# Patient Record
Sex: Female | Born: 1995 | Race: Black or African American | Hispanic: No | Marital: Single | Smoking: Former smoker
Health system: Southern US, Community
[De-identification: ages and names within clinical notes are randomized; demographics above are authoritative.]

## PROBLEM LIST (undated history)

## (undated) ENCOUNTER — Inpatient Hospital Stay (HOSPITAL_COMMUNITY): Payer: Self-pay

## (undated) DIAGNOSIS — N6320 Unspecified lump in the left breast, unspecified quadrant: Secondary | ICD-10-CM

## (undated) DIAGNOSIS — N189 Chronic kidney disease, unspecified: Secondary | ICD-10-CM

## (undated) DIAGNOSIS — N6310 Unspecified lump in the right breast, unspecified quadrant: Secondary | ICD-10-CM

## (undated) DIAGNOSIS — F419 Anxiety disorder, unspecified: Secondary | ICD-10-CM

## (undated) DIAGNOSIS — R51 Headache: Secondary | ICD-10-CM

## (undated) HISTORY — PX: DENTAL SURGERY: SHX609

---
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
## 2015-02-22 ENCOUNTER — Emergency Department (HOSPITAL_COMMUNITY)
Admission: EM | Admit: 2015-02-22 | Discharge: 2015-02-22 | Disposition: A | Discharge status: Home / Self Care | Payer: Medicaid Other | Attending: Emergency Medicine | Admitting: Emergency Medicine

## 2015-02-22 ENCOUNTER — Encounter (HOSPITAL_COMMUNITY): Payer: Self-pay | Admitting: Emergency Medicine

## 2015-02-22 DIAGNOSIS — Z3202 Encounter for pregnancy test, result negative: Secondary | ICD-10-CM | POA: Diagnosis not present

## 2015-02-22 DIAGNOSIS — N12 Tubulo-interstitial nephritis, not specified as acute or chronic: Secondary | ICD-10-CM | POA: Insufficient documentation

## 2015-02-22 DIAGNOSIS — R112 Nausea with vomiting, unspecified: Secondary | ICD-10-CM | POA: Diagnosis present

## 2015-02-22 LAB — COMPREHENSIVE METABOLIC PANEL
ALK PHOS: 61 U/L (ref 38–126)
ALT: 15 U/L (ref 14–54)
ANION GAP: 8 (ref 5–15)
AST: 26 U/L (ref 15–41)
Albumin: 4.3 g/dL (ref 3.5–5.0)
BUN: 13 mg/dL (ref 6–20)
CO2: 23 mmol/L (ref 22–32)
Calcium: 9.1 mg/dL (ref 8.9–10.3)
Chloride: 104 mmol/L (ref 101–111)
Creatinine, Ser: 0.93 mg/dL (ref 0.44–1.00)
GFR calc Af Amer: >60 mL/min (ref >60)
GFR calc non Af Amer: >60 mL/min (ref >60)
Glucose, Bld: 112 mg/dL — ABNORMAL HIGH (ref 65–99)
POTASSIUM: 3.5 mmol/L (ref 3.5–5.1)
SODIUM: 135 mmol/L (ref 135–145)
Total Bilirubin: 1 mg/dL (ref 0.3–1.2)
Total Protein: 7.9 g/dL (ref 6.5–8.1)

## 2015-02-22 LAB — CBC WITH DIFFERENTIAL/PLATELET
BASOS ABS: 0 10*3/uL (ref 0.0–0.1)
Basophils Relative: 0 % (ref 0–1)
EOS ABS: 0 10*3/uL (ref 0.0–0.7)
Eosinophils Relative: 0 % (ref 0–5)
HEMATOCRIT: 38.6 % (ref 36.0–46.0)
Hemoglobin: 13.1 g/dL (ref 12.0–15.0)
Lymphocytes Relative: 8 % — ABNORMAL LOW (ref 12–46)
Lymphs Abs: 1 10*3/uL (ref 0.7–4.0)
MCH: 28.3 pg (ref 26.0–34.0)
MCHC: 33.9 g/dL (ref 30.0–36.0)
MCV: 83.4 fL (ref 78.0–100.0)
MONO ABS: 1 10*3/uL (ref 0.1–1.0)
Monocytes Relative: 7 % (ref 3–12)
NEUTROS PCT: 85 % — AB (ref 43–77)
Neutro Abs: 11.3 10*3/uL — ABNORMAL HIGH (ref 1.7–7.7)
Platelets: 193 10*3/uL (ref 150–400)
RBC: 4.63 MIL/uL (ref 3.87–5.11)
RDW: 13.1 % (ref 11.5–15.5)
WBC: 13.2 10*3/uL — AB (ref 4.0–10.5)

## 2015-02-22 LAB — URINE MICROSCOPIC-ADD ON

## 2015-02-22 LAB — URINALYSIS, ROUTINE W REFLEX MICROSCOPIC
Bilirubin Urine: NEGATIVE
GLUCOSE, UA: NEGATIVE mg/dL
NITRITE: NEGATIVE
Protein, ur: 30 mg/dL — AB
SPECIFIC GRAVITY, URINE: 1.03 (ref 1.005–1.030)
Urobilinogen, UA: 2 mg/dL — ABNORMAL HIGH (ref 0.0–1.0)
pH: 6.5 (ref 5.0–8.0)

## 2015-02-22 LAB — WET PREP, GENITAL
TRICH WET PREP: NONE SEEN
Yeast Wet Prep HPF POC: NONE SEEN

## 2015-02-22 LAB — LIPASE, BLOOD: LIPASE: 17 U/L — AB (ref 22–51)

## 2015-02-22 LAB — PREGNANCY, URINE: Preg Test, Ur: NEGATIVE

## 2015-02-22 MED ORDER — SODIUM CHLORIDE 0.9 % IV BOLUS (SEPSIS)
1000.0000 mL | Freq: Once | INTRAVENOUS | Status: AC
  Administered 2015-02-22: 1000 mL via INTRAVENOUS

## 2015-02-22 MED ORDER — TRAMADOL HCL 50 MG PO TABS: 50.0000 mg | ORAL_TABLET | Freq: Four times a day (QID) | ORAL | Status: DC | PRN

## 2015-02-22 MED ORDER — CIPROFLOXACIN HCL 500 MG PO TABS: 500.0000 mg | ORAL_TABLET | Freq: Two times a day (BID) | ORAL | Status: DC

## 2015-02-22 MED ORDER — DEXTROSE 5 % IV SOLN
1.0000 g | Freq: Once | INTRAVENOUS | Status: AC
  Administered 2015-02-22: 1 g via INTRAVENOUS
  Filled 2015-02-22: qty 10

## 2015-02-22 MED ORDER — KETOROLAC TROMETHAMINE 30 MG/ML IJ SOLN
30.0000 mg | Freq: Once | INTRAMUSCULAR | Status: AC
  Administered 2015-02-22: 30 mg via INTRAVENOUS
  Filled 2015-02-22: qty 1

## 2015-02-22 MED ORDER — ONDANSETRON HCL 4 MG PO TABS: 4.0000 mg | ORAL_TABLET | Freq: Four times a day (QID) | ORAL | Status: DC

## 2015-02-22 MED ORDER — LORAZEPAM 2 MG/ML IJ SOLN
0.5000 mg | Freq: Once | INTRAMUSCULAR | Status: AC
  Administered 2015-02-22: 0.5 mg via INTRAVENOUS
  Filled 2015-02-22: qty 1

## 2015-02-22 MED ORDER — ONDANSETRON HCL 4 MG/2ML IJ SOLN
4.0000 mg | Freq: Once | INTRAMUSCULAR | Status: AC
  Administered 2015-02-22: 4 mg via INTRAVENOUS
  Filled 2015-02-22: qty 2

## 2015-02-22 NOTE — ED Notes (Signed)
Pt from home, per ems pt co nausea , also reports face numbness and sob, per ems possible anxiety. Pt ambulated to ambulance then to ER room. Pt is alert and oriented x 4.
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
## 2015-02-22 NOTE — Discharge Instructions (Signed)
Pyelonephritis, Adult °Pyelonephritis is a kidney infection. In general, there are 2 main types of pyelonephritis: °· Infections that come on quickly without any warning (acute pyelonephritis). °· Infections that persist for a long period of time (chronic pyelonephritis). °CAUSES  °Two main causes of pyelonephritis are: °· Bacteria traveling from the bladder to the kidney. This is a problem especially in pregnant women. The urine in the bladder can become filled with bacteria from multiple causes, including: °¨ Inflammation of the prostate gland (prostatitis). °¨ Sexual intercourse in females. °¨ Bladder infection (cystitis). °· Bacteria traveling from the bloodstream to the tissue part of the kidney. °Problems that may increase your risk of getting a kidney infection include: °· Diabetes. °· Kidney stones or bladder stones. °· Cancer. °· Catheters placed in the bladder. °· Other abnormalities of the kidney or ureter. °SYMPTOMS  °· Abdominal pain. °· Pain in the side or flank area. °· Fever. °· Chills. °· Upset stomach. °· Blood in the urine (dark urine). °· Frequent urination. °· Strong or persistent urge to urinate. °· Burning or stinging when urinating. °DIAGNOSIS  °Your caregiver may diagnose your kidney infection based on your symptoms. A urine sample may also be taken. °TREATMENT  °In general, treatment depends on how severe the infection is.  °· If the infection is mild and caught early, your caregiver may treat you with oral antibiotics and send you home. °· If the infection is more severe, the bacteria may have gotten into the bloodstream. This will require intravenous (IV) antibiotics and a hospital stay. Symptoms may include: °¨ High fever. °¨ Severe flank pain. °¨ Shaking chills. °· Even after a hospital stay, your caregiver may require you to be on oral antibiotics for a period of time. °· Other treatments may be required depending upon the cause of the infection. °HOME CARE INSTRUCTIONS  °· Take your  antibiotics as directed. Finish them even if you start to feel better. °· Make an appointment to have your urine checked to make sure the infection is gone. °· Drink enough fluids to keep your urine clear or pale yellow. °· Take medicines for the bladder if you have urgency and frequency of urination as directed by your caregiver. °SEEK IMMEDIATE MEDICAL CARE IF:  °· You have a fever or persistent symptoms for more than 2-3 days. °· You have a fever and your symptoms suddenly get worse. °· You are unable to take your antibiotics or fluids. °· You develop shaking chills. °· You experience extreme weakness or fainting. °· There is no improvement after 2 days of treatment. °MAKE SURE YOU: °· Understand these instructions. °· Will watch your condition. °· Will get help right away if you are not doing well or get worse. °Document Released: 07/28/2005 Document Revised: 01/27/2012 Document Reviewed: 01/01/2011 °ExitCare® Patient Information ©2015 [employer]. This information is not intended to replace advice given to you by your health care provider. Make sure you discuss any questions you have with your health care provider. ° °

## 2015-02-22 NOTE — ED Provider Notes (Signed)
CSN: [id number]     Arrival date & time 02/22/15  1137 History   First MD Initiated Contact with Patient 02/22/15 1138     Chief Complaint  Patient presents with  . Nausea     (Consider location/radiation/quality/duration/timing/severity/associated sxs/prior Treatment) HPI   PCP: LITTLE, Murrell ReddenEDGAR W, MD Blood pressure 117/74, pulse 93, temperature 98.1 F (36.7 C), temperature source Oral, resp. rate 21, SpO2 100 %.  Lauren Costa is a 19 y.o.female without any significant PMH presents to the ER with complaints of nausea, vomiting and abdominal pain. She saw her PCP two weeks ago for dysuria but was told she did not have any UTI. Yesterday she had the vomiting and abdominal pain but she got better as the day progressed. This morning she woke up and started having vomiting began the suprapubic and bilateral flank pain and presented to the emergency department for evaluation by EMS. The patient reports that she is been hyperventilating, having numbness to her face and her hands and feeling short of breath on EMS arrival, EMS was able to help calm her down due to anxiety she is no longer having facial numbness, shortness of breath or bilateral hand tingling. She endorses brown spotty vaginal discharge, abdominal pain, vomiting. She also endorses some dysuria. Had a pelvic done two weeks ago which was negative for infection per patient.  The patient denies diaphoresis, fever, headache, weakness (general or focal), confusion, change of vision,  neck pain, dysphagia, aphagia, chest pain, shortness of breath,  back pain, abdominal pains, nausea, vomiting, lower extremity swelling, rash.   History reviewed. No pertinent past medical history. History reviewed. No pertinent past surgical history. No family history on file. History  Substance Use Topics  . Smoking status: Never Smoker   . Smokeless tobacco: Not on file  . Alcohol Use: No   OB History    No data available     Review of  Systems  10 Systems reviewed and are negative for acute change except as noted in the HPI.     Allergies  Peanut-containing drug products  Home Medications   Prior to Admission medications   Medication Sig Start Date End Date Taking? Authorizing Provider  ibuprofen (ADVIL,MOTRIN) 400 MG tablet Take 400 mg by mouth every 6 (six) hours as needed for mild pain, moderate pain or cramping.   Yes Historical Provider, MD  cephALEXin (KEFLEX) 500 MG capsule Take 1 capsule (500 mg total) by mouth 4 (four) times daily. Patient not taking: Reported on 02/22/2015 01/07/14   Antony MaduraKelly Humes, PA-C  ciprofloxacin (CIPRO) 500 MG tablet Take 1 tablet (500 mg total) by mouth 2 (two) times daily. Patient not taking: Reported on 02/22/2015 07/22/13   Tonye Pearsonobert P Doolittle, MD  ciprofloxacin (CIPRO) 500 MG tablet Take 1 tablet (500 mg total) by mouth 2 (two) times daily. 02/22/15   Rainn Zupko Neva SeatGreene, PA-C  ibuprofen (ADVIL,MOTRIN) 800 MG tablet Take 1 tablet (800 mg total) by mouth every 8 (eight) hours as needed. Patient not taking: Reported on 02/22/2015 07/26/14   Viviano SimasLauren Robinson, NP  ondansetron (ZOFRAN) 4 MG tablet Take 1 tablet (4 mg total) by mouth every 8 (eight) hours as needed for nausea or vomiting. Patient not taking: Reported on 02/22/2015 07/22/13   Tonye Pearsonobert P Doolittle, MD  ondansetron (ZOFRAN) 4 MG tablet Take 1 tablet (4 mg total) by mouth every 6 (six) hours. 02/22/15   Kaenan Jake Neva SeatGreene, PA-C  traMADol (ULTRAM) 50 MG tablet Take 1 tablet (50 mg total) by mouth every  6 (six) hours as needed. 02/22/15   Josselin Gaulin Neva Seat, PA-C   BP 99/38 mmHg  Pulse 90  Temp(Src) 97.9 F (36.6 C) (Oral)  Resp 18  SpO2 100% Physical Exam  Constitutional: She appears well-developed and well-nourished. She appears ill. No distress.  HENT:  Head: Normocephalic and atraumatic.  Eyes: Pupils are equal, round, and reactive to light.  Neck: Normal range of motion. Neck supple.  Cardiovascular: Normal rate and regular rhythm.    Pulmonary/Chest: Effort normal.  Abdominal: Soft. Bowel sounds are normal. She exhibits no distension. There is tenderness (dfifuse but mild to palpation). There is no rigidity, no rebound, no guarding and no CVA tenderness.  Genitourinary: Uterus is not deviated, not enlarged and not tender. Cervix exhibits no motion tenderness, no discharge and no friability. Right adnexum displays no mass and no tenderness. Left adnexum displays no mass and no tenderness. There is bleeding in the vagina. No tenderness in the vagina. No foreign body around the vagina.  Dark brown blood in vaginal vault  Neurological: She is alert.  Skin: Skin is warm and dry.  Nursing note and vitals reviewed.   ED Course  Procedures (including critical care time) Labs Review Labs Reviewed  WET PREP, GENITAL - Abnormal; Notable for the following:    Clue Cells Wet Prep HPF POC FEW (*)    WBC, Wet Prep HPF POC FEW (*)    All other components within normal limits  URINALYSIS, ROUTINE W REFLEX MICROSCOPIC (NOT AT [facility]) - Abnormal; Notable for the following:    APPearance CLOUDY (*)    Hgb urine dipstick LARGE (*)    Ketones, ur >80 (*)    Protein, ur 30 (*)    Urobilinogen, UA 2.0 (*)    Leukocytes, UA TRACE (*)    All other components within normal limits  COMPREHENSIVE METABOLIC PANEL - Abnormal; Notable for the following:    Glucose, Bld 112 (*)    All other components within normal limits  LIPASE, BLOOD - Abnormal; Notable for the following:    Lipase 17 (*)    All other components within normal limits  CBC WITH DIFFERENTIAL/PLATELET - Abnormal; Notable for the following:    WBC 13.2 (*)    Neutrophils Relative % 85 (*)    Neutro Abs 11.3 (*)    Lymphocytes Relative 8 (*)    All other components within normal limits  URINE MICROSCOPIC-ADD ON - Abnormal; Notable for the following:    Squamous Epithelial / LPF FEW (*)    Bacteria, UA MANY (*)    All other components within normal limits  URINE CULTURE   PREGNANCY, URINE  GC/CHLAMYDIA PROBE AMP ([place]) NOT AT [facility]    Imaging Review No results found.   EKG Interpretation None      MDM   Final diagnoses:  Pyelonephritis    Patient having flank pain and vomiting with MANY bacteria in urine, trace leukocytes, negative nitrites, few squamous cells. 1 gram IV Rocephin ordered. Patients Wet prep shows no acute findings to suggest patients symptoms. CMP and lipase are normal. CBC shows a mildly elevated WBC. Patient with dysuria, urinary hesitancy, vomiting and mild flank pain with many bacteria in her urine- clinically pyelo. No significant tenderness on abdominal exam or pelvic.  Patient was PO challenged in the ED with crackers and apple juice. She reports feeling significantly better with no pain and no nausea. Pt is afebrile, I think she will do well at home. Strict return to ED  precautions.  Medications  LORazepam (ATIVAN) injection 0.5 mg (0.5 mg Intravenous Given 02/22/15 1232)  ondansetron (ZOFRAN) injection 4 mg (4 mg Intravenous Given 02/22/15 1232)  sodium chloride 0.9 % bolus 1,000 mL (0 mLs Intravenous Stopped 02/22/15 1333)  ketorolac (TORADOL) 30 MG/ML injection 30 mg (30 mg Intravenous Given 02/22/15 1422)  cefTRIAXone (ROCEPHIN) 1 g in dextrose 5 % 50 mL IVPB (1 g Intravenous New Bag/Given 02/22/15 1443)  sodium chloride 0.9 % bolus 1,000 mL (1,000 mLs Intravenous New Bag/Given 02/22/15 1445)    18 y.o.Melani E Peachey's evaluation in the Emergency Department is complete. It has been determined that no acute conditions requiring further emergency intervention are present at this time. The patient/guardian have been advised of the diagnosis and plan. We have discussed signs and symptoms that warrant return to the ED, such as changes or worsening in symptoms.  Vital signs are stable at discharge. Filed Vitals:   02/22/15 1433  BP: 99/38  Pulse: 90  Temp: 97.9 F (36.6 C)  Resp: 18    Patient/guardian has voiced  understanding and agreed to follow-up with the PCP or specialist.     Marlon Pel, PA-C 02/22/15 1548  Pricilla Loveless, MD 02/25/15 [phone]

## 2015-02-23 LAB — GC/CHLAMYDIA PROBE AMP (~~LOC~~) NOT AT ARMC
CHLAMYDIA, DNA PROBE: NEGATIVE
NEISSERIA GONORRHEA: NEGATIVE

## 2015-02-24 LAB — URINE CULTURE: SPECIAL REQUESTS: NORMAL

## 2015-04-12 ENCOUNTER — Emergency Department (HOSPITAL_COMMUNITY)
Admission: EM | Admit: 2015-04-12 | Discharge: 2015-04-12 | Disposition: A | Discharge status: Home / Self Care | Payer: Medicaid Other | Attending: Emergency Medicine | Admitting: Emergency Medicine

## 2015-04-12 ENCOUNTER — Encounter (HOSPITAL_COMMUNITY): Payer: Self-pay | Admitting: *Deleted

## 2015-04-12 DIAGNOSIS — R5383 Other fatigue: Secondary | ICD-10-CM | POA: Diagnosis not present

## 2015-04-12 DIAGNOSIS — J020 Streptococcal pharyngitis: Secondary | ICD-10-CM | POA: Insufficient documentation

## 2015-04-12 DIAGNOSIS — Z3202 Encounter for pregnancy test, result negative: Secondary | ICD-10-CM | POA: Diagnosis not present

## 2015-04-12 DIAGNOSIS — K5900 Constipation, unspecified: Secondary | ICD-10-CM | POA: Insufficient documentation

## 2015-04-12 DIAGNOSIS — R51 Headache: Secondary | ICD-10-CM | POA: Diagnosis not present

## 2015-04-12 DIAGNOSIS — Z792 Long term (current) use of antibiotics: Secondary | ICD-10-CM | POA: Diagnosis not present

## 2015-04-12 DIAGNOSIS — R112 Nausea with vomiting, unspecified: Secondary | ICD-10-CM | POA: Insufficient documentation

## 2015-04-12 DIAGNOSIS — J029 Acute pharyngitis, unspecified: Secondary | ICD-10-CM | POA: Diagnosis present

## 2015-04-12 LAB — RAPID STREP SCREEN (MED CTR MEBANE ONLY): STREPTOCOCCUS, GROUP A SCREEN (DIRECT): POSITIVE — AB

## 2015-04-12 LAB — URINALYSIS, ROUTINE W REFLEX MICROSCOPIC
Bilirubin Urine: NEGATIVE
Glucose, UA: NEGATIVE mg/dL
Hgb urine dipstick: NEGATIVE
KETONES UR: 40 mg/dL — AB
LEUKOCYTES UA: NEGATIVE
NITRITE: NEGATIVE
PH: 6 (ref 5.0–8.0)
Protein, ur: 100 mg/dL — AB
SPECIFIC GRAVITY, URINE: 1.036 — AB (ref 1.005–1.030)
UROBILINOGEN UA: 0.2 mg/dL (ref 0.0–1.0)

## 2015-04-12 LAB — URINE MICROSCOPIC-ADD ON

## 2015-04-12 LAB — POC URINE PREG, ED: PREG TEST UR: NEGATIVE

## 2015-04-12 MED ORDER — LIDOCAINE VISCOUS 2 % MT SOLN
15.0000 mL | Freq: Once | OROMUCOSAL | Status: AC
  Administered 2015-04-12: 15 mL via OROMUCOSAL
  Filled 2015-04-12: qty 15

## 2015-04-12 MED ORDER — DEXAMETHASONE 2 MG PO TABS
10.0000 mg | ORAL_TABLET | Freq: Once | ORAL | Status: AC
  Administered 2015-04-12: 10 mg via ORAL
  Filled 2015-04-12: qty 1
  Filled 2015-04-12: qty 2

## 2015-04-12 MED ORDER — AMOXICILLIN 500 MG PO CAPS: 500.0000 mg | ORAL_CAPSULE | Freq: Two times a day (BID) | ORAL | Status: AC

## 2015-04-12 MED ORDER — ONDANSETRON 4 MG PO TBDP: 4.0000 mg | ORAL_TABLET | Freq: Three times a day (TID) | ORAL | Status: DC | PRN

## 2015-04-12 MED ORDER — IBUPROFEN 800 MG PO TABS
800.0000 mg | ORAL_TABLET | Freq: Once | ORAL | Status: AC
  Administered 2015-04-12: 800 mg via ORAL
  Filled 2015-04-12: qty 1

## 2015-04-12 MED ORDER — ONDANSETRON 4 MG PO TBDP
4.0000 mg | ORAL_TABLET | Freq: Once | ORAL | Status: AC
  Administered 2015-04-12: 4 mg via ORAL
  Filled 2015-04-12: qty 1

## 2015-04-12 NOTE — ED Notes (Signed)
Pt arrives to the ER via EMS for complaints of nausea and vomiting x 24hrs; pt ambulatory upon arrival and talking on phone; pt states that she has vomited once in the last 24hrs; pt c/o nausea and body aches; pt states "I just don't feel well"; pt states that she has taken some Advil but that it hasn't helped; pt denies abd pain or diarrhea; pt states that her throat is irritated

## 2015-04-12 NOTE — ED Provider Notes (Signed)
CSN: [id number]     Arrival date & time 04/12/15  2026 History   First MD Initiated Contact with Patient 04/12/15 2044     Chief Complaint  Patient presents with  . Emesis     (Consider location/radiation/quality/duration/timing/severity/associated sxs/prior Treatment) HPI Comments: Friend fever too Sister with sore throat  Patient is a 19 y.o. female presenting with vomiting and pharyngitis.  Emesis Severity:  Mild Duration:  1 day Timing:  Intermittent Number of daily episodes:  2 Able to tolerate:  Liquids Recent urination:  Normal Relieved by:  Nothing Associated symptoms: headaches, myalgias and sore throat   Associated symptoms: no abdominal pain and no diarrhea   Risk factors: sick contacts   Sore Throat This is a new problem. The current episode started yesterday. The problem occurs constantly. The problem has been gradually worsening. Associated symptoms include headaches. Pertinent negatives include no chest pain, no abdominal pain and no shortness of breath. The symptoms are aggravated by swallowing. Nothing relieves the symptoms. She has tried nothing for the symptoms.    History reviewed. No pertinent past medical history. History reviewed. No pertinent past surgical history. No family history on file. Social History  Substance Use Topics  . Smoking status: Never Smoker   . Smokeless tobacco: None  . Alcohol Use: No   OB History    No data available     Review of Systems  Constitutional: Positive for fever (102.something yesterday) and fatigue. Negative for appetite change.  HENT: Positive for congestion and sore throat.   Respiratory: Positive for cough. Negative for shortness of breath.   Cardiovascular: Negative for chest pain.  Gastrointestinal: Positive for nausea, vomiting (one time, after taking medicine last night, then emesis today x1) and constipation. Negative for abdominal pain and diarrhea.  Genitourinary: Negative for dysuria, vaginal  bleeding, vaginal discharge and difficulty urinating.  Musculoskeletal: Positive for myalgias.  Skin: Negative for rash.  Neurological: Positive for headaches. Negative for syncope and light-headedness.      Allergies  Peanut-containing drug products  Home Medications   Prior to Admission medications   Medication Sig Start Date End Date Taking? Authorizing Provider  ciprofloxacin (CIPRO) 500 MG tablet Take 1 tablet (500 mg total) by mouth 2 (two) times daily. 02/22/15  Yes Tiffany Neva Seat, PA-C  ibuprofen (ADVIL,MOTRIN) 400 MG tablet Take 400 mg by mouth every 6 (six) hours as needed for mild pain, moderate pain or cramping.   Yes Historical Provider, MD  ibuprofen (ADVIL,MOTRIN) 800 MG tablet Take 800 mg by mouth every 8 (eight) hours as needed for moderate pain.   Yes Historical Provider, MD  amoxicillin (AMOXIL) 500 MG capsule Take 1 capsule (500 mg total) by mouth 2 (two) times daily. 04/12/15 04/22/15  Alvira Monday, MD  cephALEXin (KEFLEX) 500 MG capsule Take 1 capsule (500 mg total) by mouth 4 (four) times daily. Patient not taking: Reported on 02/22/2015 01/07/14   Antony Madura, PA-C  ciprofloxacin (CIPRO) 500 MG tablet Take 1 tablet (500 mg total) by mouth 2 (two) times daily. Patient not taking: Reported on 02/22/2015 07/22/13   Tonye Pearson, MD  ibuprofen (ADVIL,MOTRIN) 800 MG tablet Take 1 tablet (800 mg total) by mouth every 8 (eight) hours as needed. Patient not taking: Reported on 02/22/2015 07/26/14   Viviano Simas, NP  ondansetron (ZOFRAN ODT) 4 MG disintegrating tablet Take 1 tablet (4 mg total) by mouth every 8 (eight) hours as needed for nausea or vomiting. 04/12/15   Alvira Monday, MD  ondansetron [facility])  4 MG tablet Take 1 tablet (4 mg total) by mouth every 8 (eight) hours as needed for nausea or vomiting. Patient not taking: Reported on 02/22/2015 07/22/13   Tonye Pearson, MD  ondansetron (ZOFRAN) 4 MG tablet Take 1 tablet (4 mg total) by mouth every 6 (six)  hours. Patient not taking: Reported on 04/12/2015 02/22/15   Marlon Pel, PA-C  traMADol (ULTRAM) 50 MG tablet Take 1 tablet (50 mg total) by mouth every 6 (six) hours as needed. Patient not taking: Reported on 04/12/2015 02/22/15   Marlon Pel, PA-C   BP 111/71 mmHg  Pulse 82  Temp(Src) 98.3 F (36.8 C) (Oral)  Resp 20  SpO2 100%  LMP 03/13/2015 Physical Exam  Constitutional: She is oriented to person, place, and time. She appears well-developed and well-nourished. No distress.  HENT:  Head: Normocephalic and atraumatic.  Mouth/Throat: Oropharyngeal exudate present.  Eyes: Conjunctivae and EOM are normal.  Neck: Normal range of motion.  Cardiovascular: Normal rate, regular rhythm, normal heart sounds and intact distal pulses.  Exam reveals no gallop and no friction rub.   No murmur heard. Pulmonary/Chest: Effort normal and breath sounds normal. No respiratory distress. She has no wheezes. She has no rales.  Abdominal: Soft. She exhibits no distension. There is no tenderness. There is no guarding.  Musculoskeletal: She exhibits no edema or tenderness.  Lymphadenopathy:    She has cervical adenopathy.  Neurological: She is alert and oriented to person, place, and time.  Skin: Skin is warm and dry. No rash noted. She is not diaphoretic. No erythema.  Nursing note and vitals reviewed.   ED Course  Procedures (including critical care time) Labs Review Labs Reviewed  RAPID STREP SCREEN (NOT AT [facility]) - Abnormal; Notable for the following:    Streptococcus, Group A Screen (Direct) POSITIVE (*)    All other components within normal limits  URINALYSIS, ROUTINE W REFLEX MICROSCOPIC (NOT AT [facility]) - Abnormal; Notable for the following:    APPearance CLOUDY (*)    Specific Gravity, Urine 1.036 (*)    Ketones, ur 40 (*)    Protein, ur 100 (*)    All other components within normal limits  URINE MICROSCOPIC-ADD ON - Abnormal; Notable for the following:    Squamous Epithelial / LPF MANY  (*)    Bacteria, UA FEW (*)    All other components within normal limits  URINE CULTURE  POC URINE PREG, ED    Imaging Review No results found. I have personally reviewed and evaluated these images and lab results as part of my medical decision-making.   EKG Interpretation None      MDM   Final diagnoses:  Strep throat    18yo female with no significant medical history presents with concern of sore throat, nausea with 2 episodes of emesis, fevers, body aches. No signs of dehydration on exam, normal vital signs. Patient without tachypnea, no hypoxia, normal oxygen saturation and good breath sounds bilaterally and have low suspicion for pneumonia.  No signs epiglottitis/RPA/PTA.  Urinalysis negative for UTI. Upreg neg.  Rapid strep positive.  Given decadron, lidocaine for symptom management. Given rx for amoxicillin for 10 days and zofran for nausea. Patient discharged in stable condition with understanding of reasons to return.   Alvira Monday, MD 04/13/15 1242

## 2015-04-12 NOTE — ED Notes (Signed)
Bed: ZO10 Expected date:  Expected time:  Means of arrival:  Comments: EMS/58F/n/v

## 2015-04-14 LAB — URINE CULTURE

## 2015-09-07 ENCOUNTER — Inpatient Hospital Stay (HOSPITAL_COMMUNITY)
Admission: AD | Admit: 2015-09-07 | Discharge: 2015-09-07 | Disposition: A | Discharge status: Home / Self Care | Payer: Medicaid Other | Source: Ambulatory Visit | Attending: Obstetrics | Admitting: Obstetrics

## 2015-09-07 ENCOUNTER — Encounter (HOSPITAL_COMMUNITY): Payer: Self-pay | Admitting: *Deleted

## 2015-09-07 DIAGNOSIS — B9689 Other specified bacterial agents as the cause of diseases classified elsewhere: Secondary | ICD-10-CM

## 2015-09-07 DIAGNOSIS — Z87891 Personal history of nicotine dependence: Secondary | ICD-10-CM | POA: Diagnosis not present

## 2015-09-07 DIAGNOSIS — N760 Acute vaginitis: Secondary | ICD-10-CM | POA: Diagnosis not present

## 2015-09-07 DIAGNOSIS — A499 Bacterial infection, unspecified: Secondary | ICD-10-CM

## 2015-09-07 DIAGNOSIS — Z202 Contact with and (suspected) exposure to infections with a predominantly sexual mode of transmission: Secondary | ICD-10-CM | POA: Diagnosis not present

## 2015-09-07 DIAGNOSIS — Z113 Encounter for screening for infections with a predominantly sexual mode of transmission: Secondary | ICD-10-CM | POA: Insufficient documentation

## 2015-09-07 DIAGNOSIS — Z711 Person with feared health complaint in whom no diagnosis is made: Secondary | ICD-10-CM

## 2015-09-07 LAB — WET PREP, GENITAL
SPERM: NONE SEEN
TRICH WET PREP: NONE SEEN
YEAST WET PREP: NONE SEEN

## 2015-09-07 LAB — POCT PREGNANCY, URINE: Preg Test, Ur: NEGATIVE

## 2015-09-07 MED ORDER — METRONIDAZOLE 500 MG PO TABS: 500.0000 mg | ORAL_TABLET | Freq: Two times a day (BID) | ORAL | Status: DC

## 2015-09-07 NOTE — MAU Provider Note (Signed)
History     CSN: [id number]  Arrival date and time: 09/07/15 1859   First Provider Initiated Contact with Patient 09/07/15 2002      No chief complaint on file.  HPI [name] is a 20 y.o. G1P0010 who presents to MAU today with complaint of STD check. The patient states that she recently found out her BF is seeing someone else and that person may have Gonorrhea. She desires STD testing today. She states small amount of brown discharge x 3-4 days, but states that her period recently ended. She denies vaginal discharge, UTI symptoms or fever. She states occasional mild cramping abdominal pain.   OB History    Gravida Para Term Preterm AB TAB SAB Ectopic Multiple Living   History reviewed. No pertinent past medical history.  Past Surgical History  Procedure Laterality Date  . Dental surgery      History reviewed. No pertinent family history.  Social History  Substance Use Topics  . Smoking status: Former Games developer  . Smokeless tobacco: None  . Alcohol Use: No    Allergies:  Allergies  Allergen Reactions  . Peanut-Containing Drug Products Hives    Prescriptions prior to admission  Medication Sig Dispense Refill Last Dose  . acetaminophen (TYLENOL) 500 MG tablet Take 500 mg by mouth every 6 (six) hours as needed for mild pain or headache.   09/07/2015 at Unknown time  . [DISCONTINUED] cephALEXin (KEFLEX) 500 MG capsule Take 1 capsule (500 mg total) by mouth 4 (four) times daily. (Patient not taking: Reported on 02/22/2015) 40 capsule 0 Completed Course at Unknown time  . [DISCONTINUED] ciprofloxacin (CIPRO) 500 MG tablet Take 1 tablet (500 mg total) by mouth 2 (two) times daily. (Patient not taking: Reported on 02/22/2015) 20 tablet 0 Completed Course at Unknown time  . [DISCONTINUED] ibuprofen (ADVIL,MOTRIN) 800 MG tablet Take 1 tablet (800 mg total) by mouth every 8 (eight) hours as needed. (Patient not taking: Reported on 02/22/2015) 30 tablet 0 Not  Taking at Unknown time  . [DISCONTINUED] ondansetron (ZOFRAN) 4 MG tablet Take 1 tablet (4 mg total) by mouth every 8 (eight) hours as needed for nausea or vomiting. (Patient not taking: Reported on 02/22/2015) 10 tablet 0 Completed Course at Unknown time  . [DISCONTINUED] ondansetron (ZOFRAN) 4 MG tablet Take 1 tablet (4 mg total) by mouth every 6 (six) hours. (Patient not taking: Reported on 04/12/2015) 12 tablet 0 Completed Course at Unknown time  . [DISCONTINUED] traMADol (ULTRAM) 50 MG tablet Take 1 tablet (50 mg total) by mouth every 6 (six) hours as needed. (Patient not taking: Reported on 04/12/2015) 15 tablet 0 Completed Course at Unknown time    Review of Systems  Constitutional: Negative for fever and malaise/fatigue.  Gastrointestinal: Positive for abdominal pain. Negative for nausea, vomiting, diarrhea and constipation.  Genitourinary:       Neg - vaginal bleeding + vaginal discharge   Physical Exam   Blood pressure 120/63, pulse 56, temperature 98 F (36.7 C), temperature source Oral, resp. rate 20, height  (1.651 m), weight 158 lb (71.668 kg), last menstrual period 08/28/2015.  Physical Exam  Nursing note and vitals reviewed. Constitutional: She is oriented to person, place, and time. She appears well-developed and well-nourished. No distress.  HENT:  Head: Normocephalic and atraumatic.  Cardiovascular: Normal rate.   Respiratory: Effort normal.  GI: Soft. She exhibits no distension.  Neurological: She  is alert and oriented to person, place, and time.  Skin: Skin is warm and dry. No erythema.  Psychiatric: She has a normal mood and affect.   Results for orders placed or performed during the hospital encounter of 09/07/15 (from the past 24 hour(s))  Pregnancy, urine POC     Status: None   Collection Time: 09/07/15  7:29 PM  Result Value Ref Range   Preg Test, Ur NEGATIVE NEGATIVE  Wet prep, genital     Status: Abnormal   Collection Time: 09/07/15  7:45 PM  Result  Value Ref Range   Yeast Wet Prep HPF POC NONE SEEN NONE SEEN   Trich, Wet Prep NONE SEEN NONE SEEN   Clue Cells Wet Prep HPF POC PRESENT (A) NONE SEEN   WBC, Wet Prep HPF POC FEW (A) NONE SEEN   Sperm NONE SEEN     MAU Course  Procedures  MDM UPT - negative Wet prep and GC/Chlamydia today  Assessment and Plan  A: Bacterial Vaginosis Concern for STD  P: Discharge home Rx for Flagyl sent to patient's pharmacy GC/Chlamydia pending. Patient will be informed with abnormal results Patient advised to follow-up with Dr. Gaynell Face as needed if symptoms persist or worsen Patient may return to MAU as needed or if her condition were to change or worsen   Marny Lowenstein, PA-C  09/07/2015, 8:02 PM

## 2015-09-07 NOTE — MAU Note (Signed)
LMP  WAS 08-28-2015 - LASTED   10 DAYS  .    SAYS DEC CYCLE  WAS  1-28      SOO WORRIED    WHY  CYCLE  WAS EARLY     CRAMPING  HAS BEEN  LESS   NO BIRTH  CONTROL.      LAST SEX-   WED.     PT SAYS SHE WAS TOLD BY FRIEND  THAT  PT  BOYFRIEND   HAD  SEX  WITH ANOTHER - SHE WAS POSITIVE  FOR  GC.      PT  WENT  TO HD  IN DEC   2-   TOLD  HAD    BV  AND  YEAST-   ALL  STD  WAS  NEG.        HAS  BROWN  D/C.

## 2015-09-07 NOTE — Discharge Instructions (Signed)

## 2015-09-10 LAB — GC/CHLAMYDIA PROBE AMP (~~LOC~~) NOT AT ARMC
Chlamydia: NEGATIVE
Neisseria Gonorrhea: NEGATIVE

## 2015-09-20 ENCOUNTER — Other Ambulatory Visit: Payer: Self-pay | Admitting: Medical

## 2015-10-17 ENCOUNTER — Inpatient Hospital Stay (HOSPITAL_COMMUNITY)
Admission: AD | Admit: 2015-10-17 | Payer: Medicaid Other | Source: Ambulatory Visit | Admitting: Obstetrics & Gynecology

## 2015-12-05 ENCOUNTER — Emergency Department (HOSPITAL_COMMUNITY)
Admission: EM | Admit: 2015-12-05 | Discharge: 2015-12-05 | Disposition: A | Discharge status: Home / Self Care | Payer: Medicaid Other | Attending: Emergency Medicine | Admitting: Emergency Medicine

## 2015-12-05 ENCOUNTER — Encounter (HOSPITAL_COMMUNITY): Payer: Self-pay | Admitting: Family Medicine

## 2015-12-05 DIAGNOSIS — Z87891 Personal history of nicotine dependence: Secondary | ICD-10-CM | POA: Insufficient documentation

## 2015-12-05 DIAGNOSIS — J029 Acute pharyngitis, unspecified: Secondary | ICD-10-CM | POA: Diagnosis present

## 2015-12-05 DIAGNOSIS — N760 Acute vaginitis: Secondary | ICD-10-CM | POA: Diagnosis not present

## 2015-12-05 DIAGNOSIS — H9209 Otalgia, unspecified ear: Secondary | ICD-10-CM | POA: Diagnosis not present

## 2015-12-05 DIAGNOSIS — Z3202 Encounter for pregnancy test, result negative: Secondary | ICD-10-CM | POA: Diagnosis not present

## 2015-12-05 DIAGNOSIS — B9689 Other specified bacterial agents as the cause of diseases classified elsewhere: Secondary | ICD-10-CM

## 2015-12-05 LAB — COMPREHENSIVE METABOLIC PANEL
ALBUMIN: 3.7 g/dL (ref 3.5–5.0)
ALK PHOS: 65 U/L (ref 38–126)
ALT: 14 U/L (ref 14–54)
AST: 16 U/L (ref 15–41)
Anion gap: 8 (ref 5–15)
BUN: 8 mg/dL (ref 6–20)
CALCIUM: 9.2 mg/dL (ref 8.9–10.3)
CO2: 25 mmol/L (ref 22–32)
CREATININE: 0.81 mg/dL (ref 0.44–1.00)
Chloride: 102 mmol/L (ref 101–111)
GFR calc non Af Amer: >60 mL/min (ref >60)
GLUCOSE: 103 mg/dL — AB (ref 65–99)
Potassium: 3.6 mmol/L (ref 3.5–5.1)
SODIUM: 135 mmol/L (ref 135–145)
Total Bilirubin: 1.1 mg/dL (ref 0.3–1.2)
Total Protein: 7.2 g/dL (ref 6.5–8.1)

## 2015-12-05 LAB — URINALYSIS, ROUTINE W REFLEX MICROSCOPIC
GLUCOSE, UA: NEGATIVE mg/dL
HGB URINE DIPSTICK: NEGATIVE
Ketones, ur: 15 mg/dL — AB
Leukocytes, UA: NEGATIVE
Nitrite: NEGATIVE
PROTEIN: 30 mg/dL — AB
Specific Gravity, Urine: 1.036 — ABNORMAL HIGH (ref 1.005–1.030)
pH: 6 (ref 5.0–8.0)

## 2015-12-05 LAB — CBC
HCT: 40.5 % (ref 36.0–46.0)
Hemoglobin: 13.1 g/dL (ref 12.0–15.0)
MCH: 27.8 pg (ref 26.0–34.0)
MCHC: 32.3 g/dL (ref 30.0–36.0)
MCV: 85.8 fL (ref 78.0–100.0)
PLATELETS: 189 10*3/uL (ref 150–400)
RBC: 4.72 MIL/uL (ref 3.87–5.11)
RDW: 13.2 % (ref 11.5–15.5)
WBC: 13.6 10*3/uL — ABNORMAL HIGH (ref 4.0–10.5)

## 2015-12-05 LAB — URINE MICROSCOPIC-ADD ON

## 2015-12-05 LAB — WET PREP, GENITAL
SPERM: NONE SEEN
TRICH WET PREP: NONE SEEN
Yeast Wet Prep HPF POC: NONE SEEN

## 2015-12-05 LAB — I-STAT BETA HCG BLOOD, ED (MC, WL, AP ONLY): I-stat hCG, quantitative: <5 m[IU]/mL (ref <5)

## 2015-12-05 LAB — LIPASE, BLOOD: Lipase: 21 U/L (ref 11–51)

## 2015-12-05 LAB — I-STAT CG4 LACTIC ACID, ED: LACTIC ACID, VENOUS: 0.93 mmol/L (ref 0.5–2.0)

## 2015-12-05 LAB — RAPID STREP SCREEN (MED CTR MEBANE ONLY): Streptococcus, Group A Screen (Direct): NEGATIVE

## 2015-12-05 LAB — I-STAT TROPONIN, ED: TROPONIN I, POC: 0 ng/mL (ref 0.00–0.08)

## 2015-12-05 MED ORDER — ONDANSETRON 4 MG PO TBDP
4.0000 mg | ORAL_TABLET | Freq: Once | ORAL | Status: AC | PRN
  Administered 2015-12-05: 4 mg via ORAL

## 2015-12-05 MED ORDER — IBUPROFEN 400 MG PO TABS
400.0000 mg | ORAL_TABLET | Freq: Once | ORAL | Status: AC | PRN
  Administered 2015-12-05: 400 mg via ORAL

## 2015-12-05 MED ORDER — IBUPROFEN 400 MG PO TABS
ORAL_TABLET | ORAL | Status: AC
  Filled 2015-12-05: qty 1

## 2015-12-05 MED ORDER — METRONIDAZOLE 500 MG PO TABS: 500.0000 mg | ORAL_TABLET | Freq: Two times a day (BID) | ORAL | Status: DC

## 2015-12-05 MED ORDER — ONDANSETRON 4 MG PO TBDP
ORAL_TABLET | ORAL | Status: AC
  Filled 2015-12-05: qty 1

## 2015-12-05 NOTE — ED Provider Notes (Signed)
CSN: [id number]     Arrival date & time 12/05/15  1300 History  By signing my name below, I, Marisue Humble, attest that this documentation has been prepared under the direction and in the presence of non-physician practitioner, Roxy Horseman, PA-C. Electronically Signed: Marisue Humble, Scribe. 12/05/2015. 3:46 PM.   Chief Complaint  Patient presents with  . Urinary Frequency  . Nausea  . Abdominal Pain   The history is provided by the patient. No language interpreter was used.   HPI Comments:  Lauren Costa is a 20 y.o. female with no pertinent PMHx who presents to the Emergency Department complaining of worsening subjective fever onset ~1400 yesterday. Pt reports associated sore throat, ear pain, nausea, and vomiting. No alleviating or exacerbating factors noted. She also notes missing her last pap smear because she was bleeding and states she has a h/o bacterial infections. She requests a pelvic exam today. Pt denies dysuria or abnormal vaginal discharge.  History reviewed. No pertinent past medical history. Past Surgical History  Procedure Laterality Date  . Dental surgery     History reviewed. No pertinent family history. Social History  Substance Use Topics  . Smoking status: Former Games developer  . Smokeless tobacco: None  . Alcohol Use: No   OB History    Gravida Para Term Preterm AB TAB SAB Ectopic Multiple Living   Review of Systems  Constitutional: Positive for fever.  HENT: Positive for ear pain and sore throat.   Gastrointestinal: Positive for nausea and vomiting.  Genitourinary: Negative for dysuria and vaginal discharge.  All other systems reviewed and are negative.   Allergies  Peanut-containing drug products  Home Medications   Prior to Admission medications   Medication Sig Start Date End Date Taking? Authorizing Provider  acetaminophen (TYLENOL) 500 MG tablet Take 500 mg by mouth every 6 (six) hours as needed for mild pain or  headache.    Historical Provider, MD  metroNIDAZOLE (FLAGYL) 500 MG tablet Take 1 tablet (500 mg total) by mouth 2 (two) times daily. 12/05/15   Roxy Horseman, PA-C   BP 130/73 mmHg  Pulse 64  Temp(Src) 99.3 F (37.4 C)  Resp 18  SpO2 99%  LMP 11/14/2015 Physical Exam  Constitutional: She is oriented to person, place, and time. She appears well-developed and well-nourished.  HENT:  Head: Normocephalic and atraumatic.  Right Ear: Tympanic membrane normal.  Left Ear: Tympanic membrane normal.  Oropharynx is erythematous, positive tonsillar exudates, no abscess, uvula is midline, airway intact  Bilateral tympanic membranes are without evidence of infection, there is some cerumen impaction    Eyes: Conjunctivae and EOM are normal. Pupils are equal, round, and reactive to light. Right eye exhibits no discharge. Left eye exhibits no discharge.  Neck: Normal range of motion. Neck supple.  Cardiovascular: Normal rate, regular rhythm and normal heart sounds.  Exam reveals no gallop and no friction rub.   No murmur heard. Pulmonary/Chest: Effort normal and breath sounds normal. No respiratory distress. She has no wheezes. She has no rales. She exhibits no tenderness.  Abdominal: Soft. Bowel sounds are normal. She exhibits no distension and no mass. There is no tenderness. There is no rebound and no guarding.  Genitourinary:  Pelvic exam chaperoned by female ER tech, no right or left adnexal tenderness, no uterine tenderness, moderate white vaginal discharge, no bleeding, no CMT or friability, no foreign body, no injury to the external genitalia,  no other significant findings   Musculoskeletal: Normal range of motion. She exhibits no edema or tenderness.  Neurological: She is alert and oriented to person, place, and time. Coordination normal.  Skin: Skin is warm and dry. No rash noted. She is not diaphoretic. No erythema.  Psychiatric: She has a normal mood and affect. Her behavior is normal.  Judgment and thought content normal.  Nursing note and vitals reviewed.   ED Course  Procedures  DIAGNOSTIC STUDIES:  Oxygen Saturation is 99% on RA, normal by my interpretation.    COORDINATION OF CARE:  3:43 PM Will order strep test and set up for pelvic exam. Discussed treatment plan with pt at bedside and pt agreed to plan.  Results for orders placed or performed during the hospital encounter of 12/05/15  Rapid strep screen  Result Value Ref Range   Streptococcus, Group A Screen (Direct) NEGATIVE NEGATIVE  Wet prep, genital  Result Value Ref Range   Yeast Wet Prep HPF POC NONE SEEN NONE SEEN   Trich, Wet Prep NONE SEEN NONE SEEN   Clue Cells Wet Prep HPF POC PRESENT (A) NONE SEEN   WBC, Wet Prep HPF POC MANY (A) NONE SEEN   Sperm NONE SEEN   Lipase, blood  Result Value Ref Range   Lipase 21 11 - 51 U/L  Comprehensive metabolic panel  Result Value Ref Range   Sodium 135 135 - 145 mmol/L   Potassium 3.6 3.5 - 5.1 mmol/L   Chloride 102 101 - 111 mmol/L   CO2 25 22 - 32 mmol/L   Glucose, Bld 103 (H) 65 - 99 mg/dL   BUN 8 6 - 20 mg/dL   Creatinine, Ser 1.610.81 0.44 - 1.00 mg/dL   Calcium 9.2 8.9 - 09.610.3 mg/dL   Total Protein 7.2 6.5 - 8.1 g/dL   Albumin 3.7 3.5 - 5.0 g/dL   AST 16 15 - 41 U/L   ALT 14 14 - 54 U/L   Alkaline Phosphatase 65 38 - 126 U/L   Total Bilirubin 1.1 0.3 - 1.2 mg/dL   GFR calc non Af Amer >60 >60 mL/min   GFR calc Af Amer >60 >60 mL/min   Anion gap 8 5 - 15  CBC  Result Value Ref Range   WBC 13.6 (H) 4.0 - 10.5 K/uL   RBC 4.72 3.87 - 5.11 MIL/uL   Hemoglobin 13.1 12.0 - 15.0 g/dL   HCT 04.540.5 40.936.0 - 81.146.0 %   MCV 85.8 78.0 - 100.0 fL   MCH 27.8 26.0 - 34.0 pg   MCHC 32.3 30.0 - 36.0 g/dL   RDW 91.413.2 78.211.5 - 95.615.5 %   Platelets 189 150 - 400 K/uL  Urinalysis, Routine w reflex microscopic (not at [facility])  Result Value Ref Range   Color, Urine AMBER (A) YELLOW   APPearance CLOUDY (A) CLEAR   Specific Gravity, Urine 1.036 (H) 1.005 - 1.030   pH 6.0  5.0 - 8.0   Glucose, UA NEGATIVE NEGATIVE mg/dL   Hgb urine dipstick NEGATIVE NEGATIVE   Bilirubin Urine SMALL (A) NEGATIVE   Ketones, ur 15 (A) NEGATIVE mg/dL   Protein, ur 30 (A) NEGATIVE mg/dL   Nitrite NEGATIVE NEGATIVE   Leukocytes, UA NEGATIVE NEGATIVE  Urine microscopic-add on  Result Value Ref Range   Squamous Epithelial / LPF 6-30 (A) NONE SEEN   WBC, UA 0-5 0 - 5 WBC/hpf   RBC / HPF 0-5 0 - 5 RBC/hpf   Bacteria, UA MANY (A) NONE  SEEN   Urine-Other MUCOUS PRESENT   I-Stat beta hCG blood, ED ([facility], [facility], [facility] only)  Result Value Ref Range   I-stat hCG, quantitative <5.0 <5 mIU/mL   Comment 3          I-stat troponin, ED  Result Value Ref Range   Troponin i, poc 0.00 0.00 - 0.08 ng/mL   Comment 3          I-Stat CG4 Lactic Acid, ED  Result Value Ref Range   Lactic Acid, Venous 0.93 0.5 - 2.0 mmol/L   No results found.  I have personally reviewed and evaluated these images and lab results as part of my medical decision-making.    MDM   Final diagnoses:  BV (bacterial vaginosis)  Pharyngitis    Patient with 1 day history of sore throat, vague abdominal discomfort, nausea, and vomiting. Pregnancy test negative, urinalysis inconsistent with UTI, electrolytes are normal, mild leukocytosis to 13.6. Will check rapid strep test, and also check pelvic exam. Will reassess.  Pelvic exam remarkable for some white discharge, but otherwise no tenderness.   Patient is overall very well-appearing, not in any apparent distress.  Clue cells seen on wet prep. Will treat for bacterial vaginosis. Strep test is negative. Discharged home.  I personally performed the services described in this documentation, which was scribed in my presence. The recorded information has been reviewed and is accurate.      Roxy Horseman, PA-C 12/05/15 1646  Nelva Nay, MD 12/06/15 [phone]

## 2015-12-05 NOTE — ED Notes (Signed)
Pt here for N,V,abd pain, urinary symptoms.

## 2015-12-05 NOTE — Discharge Instructions (Signed)

## 2015-12-05 NOTE — ED Notes (Signed)
PA at bedside.  See PA assessment.

## 2015-12-06 LAB — CULTURE, GROUP A STREP (THRC)

## 2015-12-06 LAB — GC/CHLAMYDIA PROBE AMP (~~LOC~~) NOT AT ARMC
Chlamydia: POSITIVE — AB
NEISSERIA GONORRHEA: NEGATIVE

## 2015-12-06 LAB — URINE CULTURE

## 2015-12-07 ENCOUNTER — Telehealth: Payer: Self-pay | Admitting: *Deleted

## 2015-12-07 NOTE — ED Notes (Signed)
Chart to EDP for assessment. 

## 2015-12-08 ENCOUNTER — Telehealth: Payer: Self-pay

## 2015-12-09 ENCOUNTER — Telehealth: Payer: Self-pay

## 2015-12-09 NOTE — Telephone Encounter (Signed)
Post ED Visit - Positive Culture Follow-up: Unsuccessful Patient Follow-up  Culture assessed and recommendations reviewed by: []  Enzo BiNathan Batchelder, Pharm.D. []  Celedonio MiyamotoJeremy Frens, Pharm.D., BCPS []  Garvin FilaMike Maccia, Pharm.D. []  Georgina PillionElizabeth Martin, Pharm.D., BCPS []  TaftMinh Pham, [address]Ph[address]D., BCPS, AAHIVP []  Estella HuskMichelle Turner, Pharm.D., BCPS, AAHIVP []  Tennis Mustassie Stewart, [address]Pharm[address] []  Sherle Poeob Vincent, [address]Ph[address]  Positive vagina culture  [x]  Patient discharged without antimicrobial prescription and treatment is now indicated []  Organism is resistant to prescribed ED discharge antimicrobial []  Patient with positive blood cultures   Unable to contact patient after 3 attempts, letter will be sent to address on file  Jerry CarasCullom, Areg Bialas Burnett 12/09/2015, 8:05 AM

## 2015-12-13 ENCOUNTER — Telehealth: Payer: Self-pay | Admitting: *Deleted

## 2015-12-13 NOTE — ED Notes (Signed)
Spoke with patient, verified ID, informed of labs, DHHS form faxed, patient informed to abstain for sexual activity x 10 days and notify sexual partners for evaluation and treatment. Azith[facility] 1000mg  PO x [facility] [address] [phone].

## 2016-03-19 ENCOUNTER — Encounter: Payer: Medicaid Other | Admitting: Obstetrics & Gynecology

## 2016-03-19 ENCOUNTER — Ambulatory Visit (INDEPENDENT_AMBULATORY_CARE_PROVIDER_SITE_OTHER): Payer: Medicaid Other | Admitting: Obstetrics & Gynecology

## 2016-03-19 ENCOUNTER — Encounter: Payer: Self-pay | Admitting: Obstetrics & Gynecology

## 2016-03-19 VITALS — BP 125/65 | HR 77 | Wt 170.0 lb

## 2016-03-19 DIAGNOSIS — Z3401 Encounter for supervision of normal first pregnancy, first trimester: Secondary | ICD-10-CM | POA: Diagnosis not present

## 2016-03-19 DIAGNOSIS — Z3400 Encounter for supervision of normal first pregnancy, unspecified trimester: Secondary | ICD-10-CM | POA: Insufficient documentation

## 2016-03-19 DIAGNOSIS — N6310 Unspecified lump in the right breast, unspecified quadrant: Secondary | ICD-10-CM | POA: Insufficient documentation

## 2016-03-19 DIAGNOSIS — O99320 Drug use complicating pregnancy, unspecified trimester: Secondary | ICD-10-CM | POA: Insufficient documentation

## 2016-03-19 DIAGNOSIS — N6320 Unspecified lump in the left breast, unspecified quadrant: Secondary | ICD-10-CM

## 2016-03-19 HISTORY — DX: Unspecified lump in the right breast, unspecified quadrant: N63.10

## 2016-03-19 HISTORY — DX: Unspecified lump in the left breast, unspecified quadrant: N63.20

## 2016-03-19 NOTE — Addendum Note (Signed)
Addended by: Willodean RosenthalHARRAWAY-SMITH, Catha Ontko on: 03/19/2016 04:17 PM   Modules accepted: Orders

## 2016-03-19 NOTE — Progress Notes (Signed)
  Subjective:    Lauren Costa is being seen today for her first obstetrical visit.  This is not a planned pregnancy. She is at 3590w6d gestation. Her obstetrical history is significant for +THC use early in pregnancy.. Relationship with FOB: significant other, not living together. Patient does intend to breast feed. Pregnancy history fully reviewed.  Patient reports nausea.  Review of Systems:   Review of Systems  Objective:     BP 125/65   Pulse 77   Wt 170 lb (77.1 kg)   LMP 01/10/2016 (Within Days)   BMI 28.29 kg/m  Physical Exam  Exam General Appearance:    Alert, cooperative, no distress, appears stated age  Head:    Normocephalic, without obvious abnormality, atraumatic  Eyes:    conjunctiva/corneas clear, EOM's intact, both eyes  Ears:    Normal external ear canals, both ears  Nose:   Nares normal, septum midline, mucosa normal, no drainage    or sinus tenderness  Throat:   Lips, mucosa, and tongue normal; teeth and gums normal  Neck:   Supple, symmetrical, trachea midline, no adenopathy;    thyroid:  no enlargement/tenderness/nodules  Back:     Symmetric, no curvature, ROM normal, no CVA tenderness  Lungs:     Clear to auscultation bilaterally, respirations unlabored  Chest Wall:    No tenderness or deformity   Heart:    Regular rate and rhythm, S1 and S2 normal, no murmur, rub   or gallop  Breast Exam:    No tenderness.  Pt has nipple piercings bilaterally.  She also has multiple lumps in both breasts that are mobile.  Abdomen:     Soft, non-tender, bowel sounds active all four quadrants,    no masses, no organomegaly  Genitalia:    Normal female without lesion, discharge or tenderness     Extremities:   Extremities normal, atraumatic, no cyanosis or edema  Pulses:   2+ and symmetric all extremities  Skin:   Skin color, texture, turgor normal, no rashes or lesions; multiple tattoos.       Assessment:    Pregnancy: G1P0000    Patient Active Problem List   Diagnosis Date Noted  . Supervision of normal first pregnancy 03/19/2016  . Substance abuse affecting pregnancy, antepartum 03/19/2016  . Bilateral breast lump 03/19/2016    Plan:     Initial labs drawn. Prenatal vitamins. Problem list reviewed and updated. AFP3 discussed: requested.  Pt declines 1 st trimester screen Role of ultrasound in pregnancy discussed; fetal survey: requested. Amniocentesis discussed: not indicated. Follow up in 4 weeks. 70% of 40 min visit spent on counseling and coordination of care.   Pt to stop caffeine Repeat breast exam next visit   HARRAWAY-SMITH, Ledell Codrington 03/19/2016

## 2016-03-19 NOTE — Patient Instructions (Signed)
Morning Sickness Morning sickness is when you feel sick to your stomach (nauseous) during pregnancy. This nauseous feeling may or may not come with vomiting. It often occurs in the morning but can be a problem any time of day. Morning sickness is most common during the first trimester, but it may continue throughout pregnancy. While morning sickness is unpleasant, it is usually harmless unless you develop severe and continual vomiting (hyperemesis gravidarum). This condition requires more intense treatment.  CAUSES  The cause of morning sickness is not completely known but seems to be related to normal hormonal changes that occur in pregnancy. RISK FACTORS You are at greater risk if you:  Experienced nausea or vomiting before your pregnancy.  Had morning sickness during a previous pregnancy.  Are pregnant with more than one baby, such as twins. TREATMENT  Do not use any medicines (prescription, over-the-counter, or herbal) for morning sickness without first talking to your health care provider. Your health care provider may prescribe or recommend:  Vitamin B6 supplements.  Anti-nausea medicines.  The herbal medicine ginger. HOME CARE INSTRUCTIONS   Only take over-the-counter or prescription medicines as directed by your health care provider.  Taking multivitamins before getting pregnant can prevent or decrease the severity of morning sickness in most women.  Eat a piece of dry toast or unsalted crackers before getting out of bed in the morning.  Eat five or six small meals a day.  Eat dry and bland foods (rice, baked potato). Foods high in carbohydrates are often helpful.  Do not drink liquids with your meals. Drink liquids between meals.  Avoid greasy, fatty, and spicy foods.  Get someone to cook for you if the smell of any food causes nausea and vomiting.  If you feel nauseous after taking prenatal vitamins, take the vitamins at night or with a snack.  Snack on protein  foods (nuts, yogurt, cheese) between meals if you are hungry.  Eat unsweetened gelatins for desserts.  Wearing an acupressure wristband (worn for sea sickness) may be helpful.  Acupuncture may be helpful.  Do not smoke.  Get a humidifier to keep the air in your house free of odors.  Get plenty of fresh air. SEEK MEDICAL CARE IF:   Your home remedies are not working, and you need medicine.  You feel dizzy or lightheaded.  You are losing weight. SEEK IMMEDIATE MEDICAL CARE IF:   You have persistent and uncontrolled nausea and vomiting.  You pass out (faint). MAKE SURE YOU:  Understand these instructions.  Will watch your condition.  Will get help right away if you are not doing well or get worse.   This information is not intended to replace advice given to you by your health care provider. Make sure you discuss any questions you have with your health care provider.   Document Released: 09/18/2006 Document Revised: 08/02/2013 Document Reviewed: 01/12/2013 Elsevier Interactive Patient Education 2016 [employer]   Vitamin B6 50- 100mg  every 6 hours as needed Unisom 25mg  every 6 hours as needed (would not take while working as it may make you sleepy)

## 2016-03-21 LAB — CULTURE, OB URINE

## 2016-03-21 LAB — GC/CHLAMYDIA PROBE AMP
Chlamydia trachomatis, NAA: NEGATIVE
NEISSERIA GONORRHOEAE BY PCR: NEGATIVE

## 2016-03-21 LAB — URINE CULTURE, OB REFLEX

## 2016-03-26 LAB — TOXASSURE SELECT 13 (MW), URINE: PDF: 0

## 2016-03-26 LAB — PRENATAL PROFILE I(LABCORP)
Antibody Screen: NEGATIVE
BASOS ABS: 0 10*3/uL (ref 0.0–0.2)
Basos: 0 %
EOS (ABSOLUTE): 0.1 10*3/uL (ref 0.0–0.4)
Eos: 1 %
HEMOGLOBIN: 12.1 g/dL (ref 11.1–15.9)
Hematocrit: 36.7 % (ref 34.0–46.6)
Hepatitis B Surface Ag: NEGATIVE
Immature Grans (Abs): 0 10*3/uL (ref 0.0–0.1)
Immature Granulocytes: 0 %
LYMPHS ABS: 2.3 10*3/uL (ref 0.7–3.1)
Lymphs: 39 %
MCH: 28.5 pg (ref 26.6–33.0)
MCHC: 33 g/dL (ref 31.5–35.7)
MCV: 86 fL (ref 79–97)
MONOCYTES: 7 %
MONOS ABS: 0.4 10*3/uL (ref 0.1–0.9)
NEUTROS ABS: 3.1 10*3/uL (ref 1.4–7.0)
Neutrophils: 53 %
PLATELETS: 210 10*3/uL (ref 150–379)
RBC: 4.25 x10E6/uL (ref 3.77–5.28)
RDW: 14.4 % (ref 12.3–15.4)
RPR: NONREACTIVE
Rh Factor: POSITIVE
Rubella Antibodies, IGG: 3.46 index (ref >0.99)
WBC: 5.8 10*3/uL (ref 3.4–10.8)

## 2016-03-26 LAB — CYSTIC FIBROSIS MUTATION 97: Interpretation: NOT DETECTED

## 2016-03-26 LAB — DRUG SCREEN, URINE

## 2016-03-26 LAB — HIV ANTIBODY (ROUTINE TESTING W REFLEX): HIV Screen 4th Generation wRfx: NONREACTIVE

## 2016-04-17 ENCOUNTER — Ambulatory Visit (INDEPENDENT_AMBULATORY_CARE_PROVIDER_SITE_OTHER): Payer: Medicaid Other | Admitting: Obstetrics & Gynecology

## 2016-04-17 VITALS — BP 133/79 | HR 71 | Temp 98.5°F | Wt 171.5 lb

## 2016-04-17 DIAGNOSIS — Z3402 Encounter for supervision of normal first pregnancy, second trimester: Secondary | ICD-10-CM | POA: Diagnosis not present

## 2016-04-17 DIAGNOSIS — Z3689 Encounter for other specified antenatal screening: Secondary | ICD-10-CM

## 2016-04-17 NOTE — Patient Instructions (Signed)
Return to clinic for any scheduled appointments or obstetric concerns, or go to MAU for evaluation  Thank you for enrolling in MyChart. Please follow the instructions below to securely access your online medical record. MyChart allows you to send messages to your doctor, view your test results, manage appointments, and more.   How Do I Sign Up? 1. In your Internet browser, go to [employer] Address [employer] and enter [email or web address]. 2. Click on the Sign Up Now link in the Sign In box. You will see the New Member Sign Up page. 3. Enter your MyChart Access Code exactly as it appears below. You will not need to use this code after you've completed the sign-up process. If you do not sign up before the expiration date, you must request a new code.  MyChart Access Code: [email or web address] Expires: 06/16/2016 10:56 AM  4. Enter your Social Security Number ([id number]) and Date of Birth (mm/dd/yyyy) as indicated and click Submit. You will be taken to the next sign-up page. 5. Create a MyChart ID. This will be your MyChart login ID and cannot be changed, so think of one that is secure and easy to remember. 6. Create a MyChart password. You can change your password at any time. 7. Enter your Password Reset Question and Answer. This can be used at a later time if you forget your password.  8. Enter your e-mail address. You will receive e-mail notification when new information is available in MyChart. 9. Click Sign Up. You can now view your medical record.   Additional Information Remember, MyChart is NOT to be used for urgent needs. For medical emergencies, dial 911.

## 2016-04-17 NOTE — Progress Notes (Signed)
   PRENATAL VISIT NOTE  Subjective:  Lauren Costa is a 20 y.o. G1P0000 at 436w0d being seen today for ongoing prenatal care.  She is currently monitored for the following issues for this low-risk pregnancy and has Supervision of normal first pregnancy; Substance abuse affecting pregnancy, antepartum; and Bilateral breast lump on her problem list.  Patient reports no complaints.  Contractions: Not present. Vag. Bleeding: None.  Movement: Present. Denies leaking of fluid.   The following portions of the patient's history were reviewed and updated as appropriate: allergies, current medications, past family history, past medical history, past social history, past surgical history and problem list. Problem list updated.  Objective:   Vitals:   04/17/16 1037  BP: 133/79  Pulse: 71  Temp: 98.5 F (36.9 C)  Weight: 171 lb 8 oz (77.8 kg)    Fetal Status: Fetal Heart Rate (bpm): 160   Movement: Present     General:  Alert, oriented and cooperative. Patient is in no acute distress.  Skin: Skin is warm and dry. No rash noted.   Cardiovascular: Normal heart rate noted  Respiratory: Normal respiratory effort, no problems with respiration noted  Abdomen: Soft, gravid, appropriate for gestational age. Pain/Pressure: Present     Pelvic:  Cervical exam deferred        Extremities: Normal range of motion.  Edema: None  Mental Status: Normal mood and affect. Normal behavior. Normal judgment and thought content.   Urinalysis: Urine Protein: Trace Urine Glucose: Negative  Assessment and Plan:  Pregnancy: G1P0000 at 6236w0d  1. Encounter for fetal anatomic survey Anatomy scan ordered. Quad screen next visit. - US OB Comp + 14 Wk; Future  2. Encounter for supervision of normal first pregnancy in second trimester No other complaints or concerns.  Routine obstetric precautions reviewed. Please refer to After Visit Summary for other counseling recommendations.  Return in about 4 weeks (around  05/15/2016) for OB Visit and Anatomy scan.  Tereso NewcomerUgonna A Lilith Solana, MD

## 2016-05-05 ENCOUNTER — Inpatient Hospital Stay (HOSPITAL_COMMUNITY)
Admission: AD | Admit: 2016-05-05 | Discharge: 2016-05-05 | Disposition: A | Discharge status: Home / Self Care | Payer: Medicaid Other | Source: Ambulatory Visit | Attending: Family Medicine | Admitting: Family Medicine

## 2016-05-05 ENCOUNTER — Encounter: Payer: Self-pay | Admitting: *Deleted

## 2016-05-05 ENCOUNTER — Encounter (HOSPITAL_COMMUNITY): Payer: Self-pay | Admitting: *Deleted

## 2016-05-05 DIAGNOSIS — B9689 Other specified bacterial agents as the cause of diseases classified elsewhere: Secondary | ICD-10-CM | POA: Diagnosis not present

## 2016-05-05 DIAGNOSIS — O23592 Infection of other part of genital tract in pregnancy, second trimester: Secondary | ICD-10-CM | POA: Insufficient documentation

## 2016-05-05 DIAGNOSIS — O99322 Drug use complicating pregnancy, second trimester: Secondary | ICD-10-CM | POA: Diagnosis not present

## 2016-05-05 DIAGNOSIS — Z3402 Encounter for supervision of normal first pregnancy, second trimester: Secondary | ICD-10-CM

## 2016-05-05 DIAGNOSIS — O99320 Drug use complicating pregnancy, unspecified trimester: Secondary | ICD-10-CM

## 2016-05-05 DIAGNOSIS — N760 Acute vaginitis: Secondary | ICD-10-CM

## 2016-05-05 DIAGNOSIS — Z3A16 16 weeks gestation of pregnancy: Secondary | ICD-10-CM | POA: Insufficient documentation

## 2016-05-05 DIAGNOSIS — Z87891 Personal history of nicotine dependence: Secondary | ICD-10-CM | POA: Diagnosis not present

## 2016-05-05 DIAGNOSIS — N898 Other specified noninflammatory disorders of vagina: Secondary | ICD-10-CM | POA: Diagnosis present

## 2016-05-05 DIAGNOSIS — N6310 Unspecified lump in the right breast, unspecified quadrant: Secondary | ICD-10-CM

## 2016-05-05 DIAGNOSIS — N6320 Unspecified lump in the left breast, unspecified quadrant: Secondary | ICD-10-CM

## 2016-05-05 DIAGNOSIS — F1910 Other psychoactive substance abuse, uncomplicated: Secondary | ICD-10-CM

## 2016-05-05 LAB — WET PREP, GENITAL
Sperm: NONE SEEN
Trich, Wet Prep: NONE SEEN
Yeast Wet Prep HPF POC: NONE SEEN

## 2016-05-05 MED ORDER — METRONIDAZOLE 500 MG PO TABS: 500.0000 mg | ORAL_TABLET | Freq: Two times a day (BID) | ORAL | 0 refills | Status: DC

## 2016-05-05 NOTE — MAU Note (Signed)
C/o vaginal discharge with a odor, that started this AM;

## 2016-05-05 NOTE — MAU Provider Note (Signed)
None     Chief Complaint:  Vaginal Discharge   Lauren Costa is  20 y.o. G1P0000 at 6781w4d presents complaining of Vaginal Discharge with an odor (fishy) that started this am.  Obstetrical/Gynecological History: OB History    Gravida Para Term Preterm AB Living   1       0     SAB TAB Ectopic Multiple Live Births   0             Past Medical History: History reviewed. No pertinent past medical history.  Past Surgical History: Past Surgical History:  Procedure Laterality Date  . DENTAL SURGERY      Family History: Family History  Problem Relation Age of Onset  . Cancer Maternal Aunt   . Cancer Maternal Uncle   . Alcohol abuse Neg Hx   . Arthritis Neg Hx   . Asthma Neg Hx   . Birth defects Neg Hx   . COPD Neg Hx   . Depression Neg Hx   . Diabetes Neg Hx   . Drug abuse Neg Hx   . Early death Neg Hx   . Hearing loss Neg Hx   . Heart disease Neg Hx   . Hyperlipidemia Neg Hx   . Hypertension Neg Hx   . Kidney disease Neg Hx   . Learning disabilities Neg Hx   . Mental illness Neg Hx   . Mental retardation Neg Hx   . Miscarriages / Stillbirths Neg Hx   . Stroke Neg Hx   . Vision loss Neg Hx   . Varicose Veins Neg Hx     Social History: Social History  Substance Use Topics  . Smoking status: Former Games developermoker  . Smokeless tobacco: Former NeurosurgeonUser  . Alcohol use No    Allergies:  Allergies  Allergen Reactions  . Peanut-Containing Drug Products Hives    Meds:  Prescriptions Prior to Admission  Medication Sig Dispense Refill Last Dose  . acetaminophen (TYLENOL) 500 MG tablet Take 500 mg by mouth every 6 (six) hours as needed for mild pain or headache.   Not Taking    Review of Systems   Constitutional: Negative for fever and chills Eyes: Negative for visual disturbances Respiratory: Negative for shortness of breath, dyspnea Cardiovascular: Negative for chest pain or palpitations  Gastrointestinal: Negative for vomiting, diarrhea and  constipation Genitourinary: Negative for dysuria and urgency Musculoskeletal: Negative for back pain, joint pain, myalgias.  Normal ROM  Neurological: Negative for dizziness and headaches    Physical Exam  Blood pressure 121/59, pulse 74, temperature 99 F (37.2 C), temperature source Oral, resp. rate 16, height 5\' 5"  (1.651 m), weight 75.3 kg (166 lb), last menstrual period 01/10/2016. GENERAL: Well-developed, well-nourished female in no acute distress.  LUNGS: Clear to auscultation bilaterally.  HEART: Regular rate and rhythm. ABDOMEN: Soft, nontender, nondistended, gravid.  EXTREMITIES: Nontender, no edema, 2+ distal pulses. DTR's 2+   PELVIC:  SSE:  Thin white frothy dc w/amine odor.   FHT:  + doppler 150 Labs: No results found for this or any previous visit (from the past 24 hour(s)). Imaging Studies:  No results found.  Assessment: Lauren Gravesiasia E Korenek is  20 y.o. G1P0000 at 6481w4d presents with BV.  Plan: Rx flagyl.  Discussed that this is an ED and that pt should call office for appt when dealing w/non emergent issues. States that "usually they can't see me. "    CRESENZO-DISHMAN,Kaylan Friedmann 9/25/201711:16 AM

## 2016-05-05 NOTE — Discharge Instructions (Signed)

## 2016-05-05 NOTE — MAU Note (Signed)
Vaginal D/C noted this AM. White with a funny smell. No pain, burning or itching.

## 2016-05-05 NOTE — MAU Note (Signed)
URINE IN LAB 

## 2016-05-15 ENCOUNTER — Ambulatory Visit (INDEPENDENT_AMBULATORY_CARE_PROVIDER_SITE_OTHER): Payer: Medicaid Other

## 2016-05-15 ENCOUNTER — Ambulatory Visit (INDEPENDENT_AMBULATORY_CARE_PROVIDER_SITE_OTHER): Payer: Medicaid Other | Admitting: Advanced Practice Midwife

## 2016-05-15 VITALS — BP 128/79 | HR 62 | Temp 98.9°F | Wt 165.0 lb

## 2016-05-15 DIAGNOSIS — Z3482 Encounter for supervision of other normal pregnancy, second trimester: Secondary | ICD-10-CM

## 2016-05-15 DIAGNOSIS — Z3689 Encounter for other specified antenatal screening: Secondary | ICD-10-CM

## 2016-05-15 DIAGNOSIS — Z3402 Encounter for supervision of normal first pregnancy, second trimester: Secondary | ICD-10-CM

## 2016-05-15 DIAGNOSIS — Z1379 Encounter for other screening for genetic and chromosomal anomalies: Secondary | ICD-10-CM

## 2016-05-15 NOTE — Patient Instructions (Signed)

## 2016-05-15 NOTE — Progress Notes (Signed)
   PRENATAL VISIT NOTE  Subjective:  Lauren Costa is a 20 y.o. G1P0000 at 291w0d being seen today for ongoing prenatal care.  She is currently monitored for the following issues for this low-risk pregnancy and has Supervision of normal first pregnancy; Substance abuse affecting pregnancy, antepartum; and Bilateral breast lump on her problem list.  Patient reports no complaints.  Contractions: Not present. Vag. Bleeding: None.  Movement: Present. Denies leaking of fluid.   The following portions of the patient's history were reviewed and updated as appropriate: allergies, current medications, past family history, past medical history, past social history, past surgical history and problem list. Problem list updated.  Objective:   Vitals:   05/15/16 1445  BP: 128/79  Pulse: 62  Temp: 98.9 F (37.2 C)  Weight: 165 lb (74.8 kg)    Fetal Status: Fetal Heart Rate (bpm): 148   Movement: Present     General:  Alert, oriented and cooperative. Patient is in no acute distress.  Skin: Skin is warm and dry. No rash noted.   Cardiovascular: Normal heart rate noted  Respiratory: Normal respiratory effort, no problems with respiration noted  Abdomen: Soft, gravid, appropriate for gestational age. Pain/Pressure: Absent     Pelvic:  Cervical exam deferred        Extremities: Normal range of motion.     Mental Status: Normal mood and affect. Normal behavior. Normal judgment and thought content.   Urinalysis: Urine Protein: Trace Urine Glucose: Negative  Assessment and Plan:  Pregnancy: G1P0000 at 661w0d  1. Genetic screening --Not done at today's visit but ordered and pt to return for lab only. - AFP, Quad Screen; Future  2. Encounter for supervision of normal first pregnancy in second trimester   3. Encounter for fetal anatomic survey --US done at [facility] today, no final report but reviewed images, no evidence of abnormality noted.  Female gender.  Preterm labor symptoms and general  obstetric precautions including but not limited to vaginal bleeding, contractions, leaking of fluid and fetal movement were reviewed in detail with the patient. Please refer to After Visit Summary for other counseling recommendations.  Return in about 4 weeks (around 06/12/2016).  Hurshel PartyLisa A Leftwich-Kirby, CNM

## 2016-05-16 LAB — CERVICOVAGINAL ANCILLARY ONLY
Chlamydia: POSITIVE — AB
Neisseria Gonorrhea: NEGATIVE

## 2016-05-20 ENCOUNTER — Other Ambulatory Visit: Payer: Self-pay | Admitting: Advanced Practice Midwife

## 2016-05-20 ENCOUNTER — Encounter: Payer: Self-pay | Admitting: Advanced Practice Midwife

## 2016-05-20 DIAGNOSIS — A568 Sexually transmitted chlamydial infection of other sites: Secondary | ICD-10-CM | POA: Insufficient documentation

## 2016-05-20 DIAGNOSIS — O98312 Other infections with a predominantly sexual mode of transmission complicating pregnancy, second trimester: Secondary | ICD-10-CM

## 2016-05-20 MED ORDER — AZITHROMYCIN 500 MG PO TABS: 1000.0000 mg | ORAL_TABLET | Freq: Once | ORAL | 0 refills | Status: AC

## 2016-05-20 NOTE — Progress Notes (Signed)
+   CHL azithromyin 1gm for pt and partner

## 2016-05-22 ENCOUNTER — Encounter: Payer: Medicaid Other | Admitting: Obstetrics and Gynecology

## 2016-05-22 ENCOUNTER — Other Ambulatory Visit: Payer: Medicaid Other

## 2016-05-28 ENCOUNTER — Encounter: Payer: Self-pay | Admitting: Obstetrics & Gynecology

## 2016-06-09 ENCOUNTER — Encounter (HOSPITAL_COMMUNITY): Payer: Self-pay | Admitting: *Deleted

## 2016-06-09 ENCOUNTER — Inpatient Hospital Stay (HOSPITAL_COMMUNITY)
Admission: AD | Admit: 2016-06-09 | Discharge: 2016-06-09 | Disposition: A | Discharge status: Home / Self Care | Payer: Medicaid Other | Source: Ambulatory Visit | Attending: Family Medicine | Admitting: Family Medicine

## 2016-06-09 DIAGNOSIS — Z87891 Personal history of nicotine dependence: Secondary | ICD-10-CM | POA: Insufficient documentation

## 2016-06-09 DIAGNOSIS — A5901 Trichomonal vulvovaginitis: Secondary | ICD-10-CM

## 2016-06-09 DIAGNOSIS — O26892 Other specified pregnancy related conditions, second trimester: Secondary | ICD-10-CM | POA: Insufficient documentation

## 2016-06-09 DIAGNOSIS — R1030 Lower abdominal pain, unspecified: Secondary | ICD-10-CM | POA: Diagnosis present

## 2016-06-09 DIAGNOSIS — Z3A21 21 weeks gestation of pregnancy: Secondary | ICD-10-CM | POA: Insufficient documentation

## 2016-06-09 DIAGNOSIS — Z3A22 22 weeks gestation of pregnancy: Secondary | ICD-10-CM | POA: Diagnosis not present

## 2016-06-09 DIAGNOSIS — O98312 Other infections with a predominantly sexual mode of transmission complicating pregnancy, second trimester: Secondary | ICD-10-CM | POA: Diagnosis not present

## 2016-06-09 DIAGNOSIS — W010XXA Fall on same level from slipping, tripping and stumbling without subsequent striking against object, initial encounter: Secondary | ICD-10-CM | POA: Insufficient documentation

## 2016-06-09 DIAGNOSIS — W19XXXA Unspecified fall, initial encounter: Secondary | ICD-10-CM

## 2016-06-09 LAB — URINALYSIS, ROUTINE W REFLEX MICROSCOPIC
BILIRUBIN URINE: NEGATIVE
GLUCOSE, UA: NEGATIVE mg/dL
HGB URINE DIPSTICK: NEGATIVE
KETONES UR: NEGATIVE mg/dL
Leukocytes, UA: NEGATIVE
Nitrite: NEGATIVE
PH: 6.5 (ref 5.0–8.0)
Protein, ur: NEGATIVE mg/dL
Specific Gravity, Urine: 1.02 (ref 1.005–1.030)

## 2016-06-09 NOTE — MAU Provider Note (Signed)
History     CSN: [id number]  Arrival date and time: 06/09/16 1122   First Provider Initiated Contact with Patient 06/09/16 1345      Chief Complaint  Patient presents with  . Abdominal Cramping   HPI   Lauren Costa is a 20 y.o. female G1P0000 @ 8631w4d here with concerns following a fall.  She fell yesterday around 10 pm. She reports that she was walking down the stairs and she tripped and fell. She did not hit her abdomen. Her hands caught her fall along with her knees. After her fall she was experiencing worsening abdominal cramping in her lower abdomen. This is not a new complaint, she has had cramping throughout the pregnancy. She denies cramping now.   + fetal movement Denies vaginal bleeding   OB History    Gravida Para Term Preterm AB Living   1       0     SAB TAB Ectopic Multiple Live Births   0              History reviewed. No pertinent past medical history.  Past Surgical History:  Procedure Laterality Date  . DENTAL SURGERY      Family History  Problem Relation Age of Onset  . Cancer Maternal Aunt   . Cancer Maternal Uncle   . Alcohol abuse Neg Hx   . Arthritis Neg Hx   . Asthma Neg Hx   . Birth defects Neg Hx   . COPD Neg Hx   . Depression Neg Hx   . Diabetes Neg Hx   . Drug abuse Neg Hx   . Early death Neg Hx   . Hearing loss Neg Hx   . Heart disease Neg Hx   . Hyperlipidemia Neg Hx   . Hypertension Neg Hx   . Kidney disease Neg Hx   . Learning disabilities Neg Hx   . Mental illness Neg Hx   . Mental retardation Neg Hx   . Miscarriages / Stillbirths Neg Hx   . Stroke Neg Hx   . Vision loss Neg Hx   . Varicose Veins Neg Hx     Social History  Substance Use Topics  . Smoking status: Former Games developermoker  . Smokeless tobacco: Former NeurosurgeonUser  . Alcohol use No    Allergies:  Allergies  Allergen Reactions  . Peanut-Containing Drug Products Hives    Prescriptions Prior to Admission  Medication Sig Dispense Refill Last Dose  .  metroNIDAZOLE (FLAGYL) 500 MG tablet Take 1 tablet (500 mg total) by mouth 2 (two) times daily. (Patient not taking: Reported on 05/15/2016) 14 tablet 0 Not Taking  . Prenatal Vit-Fe Fumarate-FA (PRENATAL MULTIVITAMIN) TABS tablet Take 1 tablet by mouth daily at 12 noon.   Taking   Results for orders placed or performed during the hospital encounter of 06/09/16 (from the past 48 hour(s))  Urinalysis, Routine w reflex microscopic (not at [facility])     Status: None   Collection Time: 06/09/16 11:40 AM  Result Value Ref Range   Color, Urine YELLOW YELLOW   APPearance CLEAR CLEAR   Specific Gravity, Urine 1.020 1.005 - 1.030   pH 6.5 5.0 - 8.0   Glucose, UA NEGATIVE NEGATIVE mg/dL   Hgb urine dipstick NEGATIVE NEGATIVE   Bilirubin Urine NEGATIVE NEGATIVE   Ketones, ur NEGATIVE NEGATIVE mg/dL   Protein, ur NEGATIVE NEGATIVE mg/dL   Nitrite NEGATIVE NEGATIVE   Leukocytes, UA NEGATIVE NEGATIVE    Comment: MICROSCOPIC NOT DONE  ON URINES WITH NEGATIVE PROTEIN, BLOOD, LEUKOCYTES, NITRITE, OR GLUCOSE <1000 mg/dL.   Review of Systems  Constitutional: Negative for chills and fever.  Genitourinary: Negative for dysuria and urgency.   Physical Exam   Blood pressure 128/66, pulse 83, temperature 98.7 F (37.1 C), temperature source Oral, resp. rate 16, weight 173 lb 6.4 oz (78.7 kg), last menstrual period 01/10/2016.  Physical Exam  Constitutional: She is oriented to person, place, and time. She appears well-developed and well-nourished. No distress.  HENT:  Head: Normocephalic.  Eyes: Pupils are equal, round, and reactive to light.  Respiratory: Effort normal.  GI: Soft. She exhibits no distension and no mass. There is no tenderness. There is no rebound and no guarding.  Genitourinary:  Genitourinary Comments: Cervix: closed, thick, posterior   Musculoskeletal: Normal range of motion.  Neurological: She is alert and oriented to person, place, and time.  Skin: Skin is warm. She is not diaphoretic.   Psychiatric: Her behavior is normal.    MAU Course  Procedures  None  MDM  + fetal heart rate   Assessment and Plan   A:  1. Fall, initial encounter     P:  Discharge home in stable condition Return to MAU If symptoms worsen  Fall precautions discussed Keep your next scheduled appointment   Duane LopeJennifer I Danisha Brassfield, NP 06/09/2016 2:11 PM

## 2016-06-09 NOTE — MAU Note (Signed)
Yesterday she fell (was going down steps, states she caught herself- did not hit abd), today she woke up with really bad cramps.

## 2016-06-09 NOTE — Discharge Instructions (Signed)
What Do I Need to Know About Injuries During Pregnancy? °Trauma is the most common cause of injury and death in pregnant women. This can also result in significant harm or death of the baby. °Your baby is protected in the womb (uterus) by a sac filled with fluid (amniotic sac). Your baby can be harmed if there is direct, high-impact trauma to your abdomen and pelvis. This type of trauma can result in tearing of your uterus, the placenta pulling away from the wall of the uterus (placenta abruption), or the amniotic sac breaking open (rupture of membranes). These injuries can decrease or stop the blood supply to your baby or cause you to go into labor earlier than expected. Minor falls and low-impact automobile accidents do not usually harm your baby, even if they do minimally harm you. °WHAT KIND OF INJURIES CAN AFFECT MY PREGNANCY? °The most common causes of injury or death to a baby include: °· Falls. Falls are more common in the second and third trimester of the pregnancy. Factors that increase your risk of falling include: °¨ Increase in your weight. °¨ The change in your center of gravity. °¨ Tripping over an object that cannot be seen. °¨ Increased looseness (laxity) of your ligaments resulting in less coordinated movements (you may feel clumsy). °¨ Falling during high-risk activities like horseback riding or skiing. °· Automobile accidents. It is important to wear your seat belt properly, with the lap belt below your abdomen, and always practice safe driving. °· Domestic violence or assault. °· Burns (fire or electrical). °The most common causes of injury or death to the pregnant woman include: °· Injuries that cause severe bleeding, shock, and loss of blood flow to major organs. °· Head and neck injuries that result in severe brain or spinal damage. °· Chest trauma that can cause direct injury to the heart and lungs or any injury that affects the area enclosed by the ribs. Trauma to this area can result in  cardiorespiratory arrest. °WHAT CAN I DO TO PROTECT MYSELF AND MY BABY FROM INJURY WHILE I AM PREGNANT? °· Remove slippery rugs and loose objects on the floor that increase your risk of tripping. °· Avoid walking on wet or slippery floors. °· Wear comfortable shoes that have a good grip on the sole. Do not wear high-heeled shoes. °· Always wear your seat belt properly, with the lap belt below your abdomen, and always practice safe driving. Do not ride on a motorcycle while pregnant. °· Do not participate in high-impact activities or sports. °· Avoid fires, starting fires, lifting heavy pots of boiling or hot liquids, and fixing electrical problems. °· Only take over-the-counter or prescription medicines for pain, fever, or discomfort as directed by your health care provider. °· Know your blood type and the father's blood type in case you develop vaginal bleeding or experience an injury for which a blood transfusion may be necessary. °· Call your local emergency services (911 in the [place] if you are a victim of domestic violence or assault. Spousal abuse can be a significant cause of trauma during pregnancy. For help and support, contact the National Domestic Violence Hotline. °WHEN SHOULD I SEEK IMMEDIATE MEDICAL CARE?  °· You fall on your abdomen or experience any high-force accident or injury. °· You have been assaulted (domestic or otherwise). °· You have been in a car accident. °· You develop vaginal bleeding. °· You develop fluid leaking from the vagina. °· You develop uterine contractions (pelvic cramping, pain, or significant low back   pain). °· You become weak or faint, or have uncontrolled vomiting after trauma. °· You had a serious burn. This includes burns to the face, neck, hands, or genitals, or burns greater than the size of your palm anywhere else. °· You develop neck stiffness or pain after a fall or from other trauma. °· You develop a headache or vision problems after a fall or from other  trauma. °· You do not feel the baby moving or the baby is not moving as much as before a fall or other trauma. °  °This information is not intended to replace advice given to you by your health care provider. Make sure you discuss any questions you have with your health care provider. °  °Document Released: 09/04/2004 Document Revised: 08/18/2014 Document Reviewed: 05/04/2013 °Elsevier Interactive Patient Education ©2016 [employer]vier Inc. ° °

## 2016-06-12 ENCOUNTER — Encounter: Payer: Medicaid Other | Admitting: Obstetrics & Gynecology

## 2016-06-13 ENCOUNTER — Ambulatory Visit (INDEPENDENT_AMBULATORY_CARE_PROVIDER_SITE_OTHER): Payer: Medicaid Other | Admitting: Obstetrics & Gynecology

## 2016-06-13 ENCOUNTER — Other Ambulatory Visit (HOSPITAL_COMMUNITY)
Admission: RE | Admit: 2016-06-13 | Discharge: 2016-06-13 | Disposition: A | Discharge status: Home / Self Care | Payer: Medicaid Other | Source: Ambulatory Visit | Attending: Obstetrics & Gynecology | Admitting: Obstetrics & Gynecology

## 2016-06-13 VITALS — BP 119/72 | HR 81 | Temp 98.2°F | Wt 173.6 lb

## 2016-06-13 DIAGNOSIS — Z3402 Encounter for supervision of normal first pregnancy, second trimester: Secondary | ICD-10-CM

## 2016-06-13 DIAGNOSIS — Z113 Encounter for screening for infections with a predominantly sexual mode of transmission: Secondary | ICD-10-CM | POA: Insufficient documentation

## 2016-06-13 DIAGNOSIS — A749 Chlamydial infection, unspecified: Secondary | ICD-10-CM

## 2016-06-13 DIAGNOSIS — A568 Sexually transmitted chlamydial infection of other sites: Secondary | ICD-10-CM

## 2016-06-13 DIAGNOSIS — O98312 Other infections with a predominantly sexual mode of transmission complicating pregnancy, second trimester: Secondary | ICD-10-CM

## 2016-06-13 NOTE — Patient Instructions (Signed)
Return to clinic for any scheduled appointments or obstetric concerns, or go to MAU for evaluation  

## 2016-06-13 NOTE — Progress Notes (Signed)
   PRENATAL VISIT NOTE  Subjective:  Lauren Costa is a 20 y.o. G1P0000 at 2335w1d being seen today for ongoing prenatal care.  She is currently monitored for the following issues for this low-risk pregnancy and has Supervision of normal first pregnancy; Substance abuse affecting pregnancy, antepartum; Bilateral breast lump; and Chlamydia trachomatis infection in mother during second trimester of pregnancy on her problem list.  Patient reports no complaints.  Contractions: Not present. Vag. Bleeding: None.  Movement: Present. Denies leaking of fluid.   The following portions of the patient's history were reviewed and updated as appropriate: allergies, current medications, past family history, past medical history, past social history, past surgical history and problem list. Problem list updated.  Objective:   Vitals:   06/13/16 0941  BP: 119/72  Pulse: 81  Temp: 98.2 F (36.8 C)  Weight: 173 lb 9.6 oz (78.7 kg)    Fetal Status: Fetal Heart Rate (bpm): 145 Fundal Height: 22 cm Movement: Present     General:  Alert, oriented and cooperative. Patient is in no acute distress.  Skin: Skin is warm and dry. No rash noted.   Cardiovascular: Normal heart rate noted  Respiratory: Normal respiratory effort, no problems with respiration noted  Abdomen: Soft, gravid, appropriate for gestational age. Pain/Pressure: Absent     Pelvic:  Cervical exam deferred        Extremities: Normal range of motion.  Edema: None  Mental Status: Normal mood and affect. Normal behavior. Normal judgment and thought content.   Assessment and Plan:  Pregnancy: G1P0000 at 435w1d  1. Chlamydia trachomatis infection in mother during second trimester of pregnancy Treated 05/20/16, TOC today - GC/Chlamydia probe amp ([place]  2. Encounter for supervision of normal first pregnancy in second trimester Preterm labor symptoms and general obstetric precautions including but not limited to vaginal bleeding,  contractions, leaking of fluid and fetal movement were reviewed in detail with the patient. Please refer to After Visit Summary for other counseling recommendations.  Return in about 4 weeks (around 07/11/2016) for OB Visit.   Tereso NewcomerUgonna A Tyeson Tanimoto, MD

## 2016-06-17 LAB — GC/CHLAMYDIA PROBE AMP (~~LOC~~) NOT AT ARMC
Chlamydia: NEGATIVE
Neisseria Gonorrhea: NEGATIVE

## 2016-07-11 ENCOUNTER — Ambulatory Visit (INDEPENDENT_AMBULATORY_CARE_PROVIDER_SITE_OTHER): Payer: Medicaid Other | Admitting: Certified Nurse Midwife

## 2016-07-11 VITALS — BP 115/71 | HR 76 | Wt 177.0 lb

## 2016-07-11 DIAGNOSIS — N6320 Unspecified lump in the left breast, unspecified quadrant: Secondary | ICD-10-CM

## 2016-07-11 DIAGNOSIS — O99320 Drug use complicating pregnancy, unspecified trimester: Secondary | ICD-10-CM

## 2016-07-11 DIAGNOSIS — Z23 Encounter for immunization: Secondary | ICD-10-CM | POA: Diagnosis not present

## 2016-07-11 DIAGNOSIS — F1910 Other psychoactive substance abuse, uncomplicated: Secondary | ICD-10-CM

## 2016-07-11 DIAGNOSIS — A749 Chlamydial infection, unspecified: Secondary | ICD-10-CM

## 2016-07-11 DIAGNOSIS — O99322 Drug use complicating pregnancy, second trimester: Secondary | ICD-10-CM

## 2016-07-11 DIAGNOSIS — O98312 Other infections with a predominantly sexual mode of transmission complicating pregnancy, second trimester: Secondary | ICD-10-CM

## 2016-07-11 DIAGNOSIS — A568 Sexually transmitted chlamydial infection of other sites: Secondary | ICD-10-CM

## 2016-07-11 DIAGNOSIS — Z3402 Encounter for supervision of normal first pregnancy, second trimester: Secondary | ICD-10-CM

## 2016-07-11 DIAGNOSIS — N6310 Unspecified lump in the right breast, unspecified quadrant: Secondary | ICD-10-CM

## 2016-07-11 NOTE — Progress Notes (Signed)
Subjective:    Lauren Costa is a 20 y.o. female being seen today for her obstetrical visit. She is at 6452w1d gestation. Patient reports: no complaints . Fetal movement: normal.  Problem List Items Addressed This Visit      Other   Supervision of normal first pregnancy - Primary   Relevant Orders   MaterniT21 PLUS Core+SCA   Substance abuse affecting pregnancy, antepartum   Relevant Orders   MaterniT21 PLUS Core+SCA   ToxASSURE Select 13 (MW), Urine   Bilateral breast lump   Chlamydia trachomatis infection in mother during second trimester of pregnancy   Relevant Orders   NuSwab Vaginitis Plus (VG+)     Patient Active Problem List   Diagnosis Date Noted  . Chlamydia trachomatis infection in mother during second trimester of pregnancy 05/20/2016  . Supervision of normal first pregnancy 03/19/2016  . Substance abuse affecting pregnancy, antepartum 03/19/2016  . Bilateral breast lump 03/19/2016   Objective:    BP 115/71   Pulse 76   Wt 177 lb (80.3 kg)   LMP 01/10/2016 (Within Days)   BMI 29.45 kg/m  FHT: 145 BPM  Uterine Size: 26 cm and size equals dates     Assessment:    Pregnancy @ 6752w1d    H/O chlamydia  Plan:    maternity21 today   F/U swab for hx chlamydia   ?substance abuse   OBGCT: discussed and ordered for next visit. Signs and symptoms of preterm labor: discussed.  Labs, problem list reviewed and updated 2 hr GTT planned Follow up in 2 weeks.

## 2016-07-11 NOTE — Addendum Note (Signed)
Addended by: Marya LandryFOSTER, SUZANNE D on: 07/11/2016 11:08 AM   Modules accepted: Orders

## 2016-07-15 LAB — NUSWAB VAGINITIS PLUS (VG+)
CANDIDA ALBICANS, NAA: NEGATIVE
CANDIDA GLABRATA, NAA: NEGATIVE
CHLAMYDIA TRACHOMATIS, NAA: NEGATIVE
Neisseria gonorrhoeae, NAA: NEGATIVE
Trich vag by NAA: NEGATIVE

## 2016-07-17 ENCOUNTER — Other Ambulatory Visit: Payer: Self-pay | Admitting: Certified Nurse Midwife

## 2016-07-17 DIAGNOSIS — F1210 Cannabis abuse, uncomplicated: Secondary | ICD-10-CM | POA: Insufficient documentation

## 2016-07-17 LAB — TOXASSURE SELECT 13 (MW), URINE

## 2016-07-20 LAB — MATERNIT21 PLUS CORE+SCA
CHROMOSOME 21: NEGATIVE
Chromosome 13: NEGATIVE
Chromosome 18: NEGATIVE
Y Chromosome: NOT DETECTED

## 2016-07-24 ENCOUNTER — Other Ambulatory Visit: Payer: Self-pay | Admitting: Certified Nurse Midwife

## 2016-07-24 DIAGNOSIS — Z3402 Encounter for supervision of normal first pregnancy, second trimester: Secondary | ICD-10-CM

## 2016-07-28 ENCOUNTER — Ambulatory Visit (INDEPENDENT_AMBULATORY_CARE_PROVIDER_SITE_OTHER): Payer: Medicaid Other | Admitting: Obstetrics and Gynecology

## 2016-07-28 ENCOUNTER — Other Ambulatory Visit: Payer: Medicaid Other

## 2016-07-28 ENCOUNTER — Other Ambulatory Visit (HOSPITAL_COMMUNITY)
Admission: RE | Admit: 2016-07-28 | Discharge: 2016-07-28 | Disposition: A | Discharge status: Home / Self Care | Payer: Medicaid Other | Source: Ambulatory Visit | Attending: Obstetrics and Gynecology | Admitting: Obstetrics and Gynecology

## 2016-07-28 ENCOUNTER — Encounter: Payer: Medicaid Other | Admitting: Certified Nurse Midwife

## 2016-07-28 VITALS — BP 120/70 | HR 85 | Temp 98.1°F | Wt 181.0 lb

## 2016-07-28 DIAGNOSIS — Z3400 Encounter for supervision of normal first pregnancy, unspecified trimester: Secondary | ICD-10-CM

## 2016-07-28 DIAGNOSIS — O98312 Other infections with a predominantly sexual mode of transmission complicating pregnancy, second trimester: Secondary | ICD-10-CM

## 2016-07-28 DIAGNOSIS — A568 Sexually transmitted chlamydial infection of other sites: Secondary | ICD-10-CM

## 2016-07-28 DIAGNOSIS — A749 Chlamydial infection, unspecified: Secondary | ICD-10-CM

## 2016-07-28 DIAGNOSIS — Z113 Encounter for screening for infections with a predominantly sexual mode of transmission: Secondary | ICD-10-CM | POA: Diagnosis not present

## 2016-07-28 MED ORDER — METRONIDAZOLE 500 MG PO TABS: 500.0000 mg | ORAL_TABLET | Freq: Two times a day (BID) | ORAL | 0 refills | Status: DC

## 2016-07-28 NOTE — Progress Notes (Signed)
   PRENATAL VISIT NOTE  Subjective:  Lauren Costa is a 20 y.o. G1P0000 at 4858w4d being seen today for ongoing prenatal care.  She is currently monitored for the following issues for this low-risk pregnancy and has Supervision of normal first pregnancy; Substance abuse affecting pregnancy, antepartum; Bilateral breast lump; Chlamydia trachomatis infection in mother during second trimester of pregnancy; and Mild tetrahydrocannabinol (THC) abuse on her problem list.  Patient reports no complaints.  Contractions: Not present. Vag. Bleeding: None.  Movement: Present. Denies leaking of fluid.   The following portions of the patient's history were reviewed and updated as appropriate: allergies, current medications, past family history, past medical history, past social history, past surgical history and problem list. Problem list updated.  Objective:   Vitals:   07/28/16 0955  BP: 120/70  Pulse: 85  Temp: 98.1 F (36.7 C)  Weight: 181 lb (82.1 kg)    Fetal Status: Fetal Heart Rate (bpm): 152   Movement: Present     General:  Alert, oriented and cooperative. Patient is in no acute distress.  Skin: Skin is warm and dry. No rash noted.   Cardiovascular: Normal heart rate noted  Respiratory: Normal respiratory effort, no problems with respiration noted  Abdomen: Soft, gravid, appropriate for gestational age. Pain/Pressure: Absent     Pelvic:  Cervical exam deferred        Extremities: Normal range of motion.  Edema: None  Mental Status: Normal mood and affect. Normal behavior. Normal judgment and thought content.   Assessment and Plan:  Pregnancy: G1P0000 at 8158w4d  1. Supervision of normal first pregnancy, antepartum Patient is doing well without complaints Third trimester labs today, and glucola Patient declined tdap - 2Hr GTT w/ 1 Hr Carpenter 75 g - CBC - RPR - HIV antibody  2. Chlamydia trachomatis infection in mother during second trimester of pregnancy Test of cure  today  Preterm labor symptoms and general obstetric precautions including but not limited to vaginal bleeding, contractions, leaking of fluid and fetal movement were reviewed in detail with the patient. Please refer to After Visit Summary for other counseling recommendations.  No Follow-up on file.   Catalina AntiguaPeggy Draylon Mercadel, MD

## 2016-07-28 NOTE — Addendum Note (Signed)
Addended by: Catalina AntiguaONSTANT, Aiden Helzer on: 07/28/2016 10:24 AM   Modules accepted: Orders

## 2016-07-29 LAB — CBC
Hematocrit: 32.5 % — ABNORMAL LOW (ref 34.0–46.6)
Hemoglobin: 10.9 g/dL — ABNORMAL LOW (ref 11.1–15.9)
MCH: 29.1 pg (ref 26.6–33.0)
MCHC: 33.5 g/dL (ref 31.5–35.7)
MCV: 87 fL (ref 79–97)
PLATELETS: 181 10*3/uL (ref 150–379)
RBC: 3.74 x10E6/uL — ABNORMAL LOW (ref 3.77–5.28)
RDW: 13.7 % (ref 12.3–15.4)
WBC: 8 10*3/uL (ref 3.4–10.8)

## 2016-07-29 LAB — GLUCOSE TOLERANCE, 2 HOURS W/ 1HR
GLUCOSE, 2 HOUR: 74 mg/dL (ref 65–152)
Glucose, 1 hour: 55 mg/dL — ABNORMAL LOW (ref 65–179)
Glucose, Fasting: 76 mg/dL (ref 65–91)

## 2016-07-29 LAB — RPR: RPR Ser Ql: NONREACTIVE

## 2016-07-29 LAB — HIV ANTIBODY (ROUTINE TESTING W REFLEX): HIV SCREEN 4TH GENERATION: NONREACTIVE

## 2016-07-29 LAB — GC/CHLAMYDIA PROBE AMP (~~LOC~~) NOT AT ARMC
CHLAMYDIA, DNA PROBE: NEGATIVE
Neisseria Gonorrhea: NEGATIVE

## 2016-08-11 NOTE — L&D Delivery Note (Signed)
Delivery Note Patient presented in latent labor.  She required pitocin augmentation in the active phase of the 1st stage of labor.  She progressed to complete.  At 9:28 AM a viable female was delivered via Vaginal, Spontaneous Delivery (Presentation: ROA).  APGAR: 8, 9; weight pending.   Placenta status: spontaneous, intact.  Cord:  3 vessel cord, no gasses collected  Anesthesia:  Epidural, lidocaine 1% Episiotomy: None Lacerations: Labial;Vaginal Suture Repair: 3.0 vicryl and 4.0 monocryl Est. Blood Loss (mL):  200  Mom to postpartum.  Baby to Couplet care / Skin to Skin.  Charlsie MerlesJulia Rhoden 10/21/2016, 10:00 AM   OB FELLOW DELIVERY ATTESTATION  I was gloved and present for the delivery in its entirety, and I agree with the above resident's note.    Jen MowElizabeth Ehab Humber, DO OB Fellow

## 2016-08-12 ENCOUNTER — Encounter: Payer: Self-pay | Admitting: Obstetrics and Gynecology

## 2016-08-12 ENCOUNTER — Ambulatory Visit (INDEPENDENT_AMBULATORY_CARE_PROVIDER_SITE_OTHER): Payer: Medicaid Other | Admitting: Obstetrics and Gynecology

## 2016-08-12 VITALS — BP 125/72 | HR 78 | Temp 98.6°F | Wt 181.0 lb

## 2016-08-12 DIAGNOSIS — Z3403 Encounter for supervision of normal first pregnancy, third trimester: Secondary | ICD-10-CM

## 2016-08-12 NOTE — Progress Notes (Signed)
Subjective:  Lauren Costa is a 21 y.o. G1P0000 at 5656w5d being seen today for ongoing prenatal care.  She is currently monitored for the following issues for this low-risk pregnancy and has Supervision of normal first pregnancy; Substance abuse affecting pregnancy, antepartum; Bilateral breast lump; and Mild tetrahydrocannabinol (THC) abuse on her problem list.  Patient reports no complaints.  Contractions: Not present. Vag. Bleeding: None.  Movement: Present. Denies leaking of fluid.   The following portions of the patient's history were reviewed and updated as appropriate: allergies, current medications, past family history, past medical history, past social history, past surgical history and problem list. Problem list updated.  Objective:   Vitals:   08/12/16 0940  BP: 125/72  Pulse: 78  Temp: 98.6 F (37 C)  Weight: 181 lb (82.1 kg)    Fetal Status: Fetal Heart Rate (bpm): 143   Movement: Present     General:  Alert, oriented and cooperative. Patient is in no acute distress.  Skin: Skin is warm and dry. No rash noted.   Cardiovascular: Normal heart rate noted  Respiratory: Normal respiratory effort, no problems with respiration noted  Abdomen: Soft, gravid, appropriate for gestational age. Pain/Pressure: Absent     Pelvic:  Cervical exam deferred        Extremities: Normal range of motion.  Edema: None  Mental Status: Normal mood and affect. Normal behavior. Normal judgment and thought content.   Urinalysis:      Assessment and Plan:  Pregnancy: G1P0000 at 1656w5d  1. Encounter for supervision of normal first pregnancy in third trimester No problems  Preterm labor symptoms and general obstetric precautions including but not limited to vaginal bleeding, contractions, leaking of fluid and fetal movement were reviewed in detail with the patient. Please refer to After Visit Summary for other counseling recommendations.  Return in about 2 weeks (around 08/26/2016) for OB  visit.   Hermina StaggersMichael L Laticia Vannostrand, MD

## 2016-08-12 NOTE — Progress Notes (Signed)
Patient states that she feels good today. 

## 2016-08-26 ENCOUNTER — Encounter: Payer: Self-pay | Admitting: Obstetrics & Gynecology

## 2016-08-26 ENCOUNTER — Ambulatory Visit (INDEPENDENT_AMBULATORY_CARE_PROVIDER_SITE_OTHER): Payer: Medicaid Other | Admitting: Obstetrics & Gynecology

## 2016-08-26 VITALS — BP 128/79 | Wt 183.0 lb

## 2016-08-26 DIAGNOSIS — Z3403 Encounter for supervision of normal first pregnancy, third trimester: Secondary | ICD-10-CM

## 2016-08-26 NOTE — Progress Notes (Signed)
   PRENATAL VISIT NOTE  Subjective:  Lauren Costa is a 21 y.o. G1P0000 at [redacted]w[redacted]d being seen today for ongoing prenatal care.  She is currently monitored for the following issues for this low-risk pregnancy and has Supervision of normal first pregnancy; Substance abuse affecting pregnancy, antepartum; Bilateral breast lump; and Mild tetrahydrocannabinol (THC) abuse on her problem list.  Patient reports no complaints.  Contractions: Not present. Vag. Bleeding: None.  Movement: Present. Denies leaking of fluid.   The following portions of the patient's history were reviewed and updated as appropriate: allergies, current medications, past family history, past medical history, past social history, past surgical history and problem list. Problem list updated.  Objective:   Vitals:   08/26/16 1444  BP: 128/79  Weight: 183 lb (83 kg)    Fetal Status: Fetal Heart Rate (bpm): 140 Fundal Height: 33 cm Movement: Present     General:  Alert, oriented and cooperative. Patient is in no acute distress.  Skin: Skin is warm and dry. No rash noted.   Cardiovascular: Normal heart rate noted  Respiratory: Normal respiratory effort, no problems with respiration noted  Abdomen: Soft, gravid, appropriate for gestational age. Pain/Pressure: Present     Pelvic:  Cervical exam deferred        Extremities: Normal range of motion.  Edema: None  Mental Status: Normal mood and affect. Normal behavior. Normal judgment and thought content.   Assessment and Plan:  Pregnancy: G1P0000 at 2128w5d  1. Encounter for supervision of normal first pregnancy in third trimester Borderline elevated BP noted, stable  Preterm labor symptoms and general obstetric precautions including but not limited to vaginal bleeding, contractions, leaking of fluid and fetal movement were reviewed in detail with the patient. Please refer to After Visit Summary for other counseling recommendations.  Return in about 2 weeks (around  09/09/2016).   Adam PhenixJames G Arnold, MD

## 2016-08-26 NOTE — Patient Instructions (Signed)
Third Trimester of Pregnancy The third trimester is from week 29 through week 40 (months 7 through 9). The third trimester is a time when the unborn baby (fetus) is growing rapidly. At the end of the ninth month, the fetus is about 20 inches in length and weighs 6-10 pounds. Body changes during your third trimester Your body goes through many changes during pregnancy. The changes vary from woman to woman. During the third trimester:  Your weight will continue to increase. You can expect to gain 25-35 pounds (11-16 kg) by the end of the pregnancy.  You may begin to get stretch marks on your hips, abdomen, and breasts.  You may urinate more often because the fetus is moving lower into your pelvis and pressing on your bladder.  You may develop or continue to have heartburn. This is caused by increased hormones that slow down muscles in the digestive tract.  You may develop or continue to have constipation because increased hormones slow digestion and cause the muscles that push waste through your intestines to relax.  You may develop hemorrhoids. These are swollen veins (varicose veins) in the rectum that can itch or be painful.  You may develop swollen, bulging veins (varicose veins) in your legs.  You may have increased body aches in the pelvis, back, or thighs. This is due to weight gain and increased hormones that are relaxing your joints.  You may have changes in your hair. These can include thickening of your hair, rapid growth, and changes in texture. Some women also have hair loss during or after pregnancy, or hair that feels dry or thin. Your hair will most likely return to normal after your baby is born.  Your breasts will continue to grow and they will continue to become tender. A yellow fluid (colostrum) may leak from your breasts. This is the first milk you are producing for your baby.  Your belly button may stick out.  You may notice more swelling in your hands, face, or  ankles.  You may have increased tingling or numbness in your hands, arms, and legs. The skin on your belly may also feel numb.  You may feel short of breath because of your expanding uterus.  You may have more problems sleeping. This can be caused by the size of your belly, increased need to urinate, and an increase in your body's metabolism.  You may notice the fetus "dropping," or moving lower in your abdomen.  You may have increased vaginal discharge.  Your cervix becomes thin and soft (effaced) near your due date. What to expect at prenatal visits You will have prenatal exams every 2 weeks until week 36. Then you will have weekly prenatal exams. During a routine prenatal visit:  You will be weighed to make sure you and the fetus are growing normally.  Your blood pressure will be taken.  Your abdomen will be measured to track your baby's growth.  The fetal heartbeat will be listened to.  Any test results from the previous visit will be discussed.  You may have a cervical check near your due date to see if you have effaced. At around 36 weeks, your health care provider will check your cervix. At the same time, your health care provider will also perform a test on the secretions of the vaginal tissue. This test is to determine if a type of bacteria, Group B streptococcus, is present. Your health care provider will explain this further. Your health care provider may ask you:    What your birth plan is.  How you are feeling.  If you are feeling the baby move.  If you have had any abnormal symptoms, such as leaking fluid, bleeding, severe headaches, or abdominal cramping.  If you are using any tobacco products, including cigarettes, chewing tobacco, and electronic cigarettes.  If you have any questions. Other tests or screenings that may be performed during your third trimester include:  Blood tests that check for low iron levels (anemia).  Fetal testing to check the health,  activity level, and growth of the fetus. Testing is done if you have certain medical conditions or if there are problems during the pregnancy.  Nonstress test (NST). This test checks the health of your baby to make sure there are no signs of problems, such as the baby not getting enough oxygen. During this test, a belt is placed around your belly. The baby is made to move, and its heart rate is monitored during movement. What is false labor? False labor is a condition in which you feel small, irregular tightenings of the muscles in the womb (contractions) that eventually go away. These are called Braxton Hicks contractions. Contractions may last for hours, days, or even weeks before true labor sets in. If contractions come at regular intervals, become more frequent, increase in intensity, or become painful, you should see your health care provider. What are the signs of labor?  Abdominal cramps.  Regular contractions that start at 10 minutes apart and become stronger and more frequent with time.  Contractions that start on the top of the uterus and spread down to the lower abdomen and back.  Increased pelvic pressure and dull back pain.  A watery or bloody mucus discharge that comes from the vagina.  Leaking of amniotic fluid. This is also known as your "water breaking." It could be a slow trickle or a gush. Let your doctor know if it has a color or strange odor. If you have any of these signs, call your health care provider right away, even if it is before your due date. Follow these instructions at home: Eating and drinking  Continue to eat regular, healthy meals.  Do not eat:  Raw meat or meat spreads.  Unpasteurized milk or cheese.  Unpasteurized juice.  Store-made salad.  Refrigerated smoked seafood.  Hot dogs or deli meat, unless they are piping hot.  More than 6 ounces of albacore tuna a week.  Shark, swordfish, king mackerel, or tile fish.  Store-made salads.  Raw  sprouts, such as mung bean or alfalfa sprouts.  Take prenatal vitamins as told by your health care provider.  Take 1000 mg of calcium daily as told by your health care provider.  If you develop constipation:  Take over-the-counter or prescription medicines.  Drink enough fluid to keep your urine clear or pale yellow.  Eat foods that are high in fiber, such as fresh fruits and vegetables, whole grains, and beans.  Limit foods that are high in fat and processed sugars, such as fried and sweet foods. Activity  Exercise only as directed by your health care provider. Healthy pregnant women should aim for 2 hours and 30 minutes of moderate exercise per week. If you experience any pain or discomfort while exercising, stop.  Avoid heavy lifting.  Do not exercise in extreme heat or humidity, or at high altitudes.  Wear low-heel, comfortable shoes.  Practice good posture.  Do not travel far distances unless it is absolutely necessary and only with the approval   of your health care provider.  Wear your seat belt at all times while in a car, on a bus, or on a plane.  Take frequent breaks and rest with your legs elevated if you have leg cramps or low back pain.  Do not use hot tubs, steam rooms, or saunas.  You may continue to have sex unless your health care provider tells you otherwise. Lifestyle  Do not use any products that contain nicotine or tobacco, such as cigarettes and e-cigarettes. If you need help quitting, ask your health care provider.  Do not drink alcohol.  Do not use any medicinal herbs or unprescribed drugs. These chemicals affect the formation and growth of the baby.  If you develop varicose veins:  Wear support pantyhose or compression stockings as told by your healthcare provider.  Elevate your feet for 15 minutes, 3-4 times a day.  Wear a supportive maternity bra to help with breast tenderness. General instructions  Take over-the-counter and prescription  medicines only as told by your health care provider. There are medicines that are either safe or unsafe to take during pregnancy.  Take warm sitz baths to soothe any pain or discomfort caused by hemorrhoids. Use hemorrhoid cream or witch hazel if your health care provider approves.  Avoid cat litter boxes and soil used by cats. These carry germs that can cause birth defects in the baby. If you have a cat, ask someone to clean the litter box for you.  To prepare for the arrival of your baby:  Take prenatal classes to understand, practice, and ask questions about the labor and delivery.  Make a trial run to the hospital.  Visit the hospital and tour the maternity area.  Arrange for maternity or paternity leave through employers.  Arrange for family and friends to take care of pets while you are in the hospital.  Purchase a rear-facing car seat and make sure you know how to install it in your car.  Pack your hospital bag.  Prepare the baby's nursery. Make sure to remove all pillows and stuffed animals from the baby's crib to prevent suffocation.  Visit your dentist if you have not gone during your pregnancy. Use a soft toothbrush to brush your teeth and be gentle when you floss.  Keep all prenatal follow-up visits as told by your health care provider. This is important. Contact a health care provider if:  You are unsure if you are in labor or if your water has broken.  You become dizzy.  You have mild pelvic cramps, pelvic pressure, or nagging pain in your abdominal area.  You have lower back pain.  You have persistent nausea, vomiting, or diarrhea.  You have an unusual or bad smelling vaginal discharge.  You have pain when you urinate. Get help right away if:  You have a fever.  You are leaking fluid from your vagina.  You have spotting or bleeding from your vagina.  You have severe abdominal pain or cramping.  You have rapid weight loss or weight gain.  You have  shortness of breath with chest pain.  You notice sudden or extreme swelling of your face, hands, ankles, feet, or legs.  Your baby makes fewer than 10 movements in 2 hours.  You have severe headaches that do not go away with medicine.  You have vision changes. Summary  The third trimester is from week 29 through week 40, months 7 through 9. The third trimester is a time when the unborn baby (fetus)   is growing rapidly.  During the third trimester, your discomfort may increase as you and your baby continue to gain weight. You may have abdominal, leg, and back pain, sleeping problems, and an increased need to urinate.  During the third trimester your breasts will keep growing and they will continue to become tender. A yellow fluid (colostrum) may leak from your breasts. This is the first milk you are producing for your baby.  False labor is a condition in which you feel small, irregular tightenings of the muscles in the womb (contractions) that eventually go away. These are called Braxton Hicks contractions. Contractions may last for hours, days, or even weeks before true labor sets in.  Signs of labor can include: abdominal cramps; regular contractions that start at 10 minutes apart and become stronger and more frequent with time; watery or bloody mucus discharge that comes from the vagina; increased pelvic pressure and dull back pain; and leaking of amniotic fluid. This information is not intended to replace advice given to you by your health care provider. Make sure you discuss any questions you have with your health care provider. Document Released: 07/22/2001 Document Revised: 01/03/2016 Document Reviewed: 09/28/2012 Elsevier Interactive Patient Education  2017 [employer]  

## 2016-09-09 ENCOUNTER — Ambulatory Visit (INDEPENDENT_AMBULATORY_CARE_PROVIDER_SITE_OTHER): Payer: Medicaid Other | Admitting: Obstetrics & Gynecology

## 2016-09-09 VITALS — BP 117/74 | HR 80 | Wt 185.0 lb

## 2016-09-09 DIAGNOSIS — Z3403 Encounter for supervision of normal first pregnancy, third trimester: Secondary | ICD-10-CM

## 2016-09-09 MED ORDER — PANTOPRAZOLE SODIUM 20 MG PO TBEC: 20.0000 mg | DELAYED_RELEASE_TABLET | Freq: Every day | ORAL | 1 refills | Status: DC

## 2016-09-09 NOTE — Progress Notes (Signed)
   PRENATAL VISIT NOTE  Subjective:  Lauren Costa is a 21 y.o. G1P0000 at 52101w5d being seen today for ongoing prenatal care.  She is currently monitored for the following issues for this low-risk pregnancy and has Supervision of normal first pregnancy; Substance abuse affecting pregnancy, antepartum; Bilateral breast lump; and Mild tetrahydrocannabinol (THC) abuse on her problem list.  Patient reports heartburn and nausea.  Contractions: Not present. Vag. Bleeding: None.  Movement: Present. Denies leaking of fluid.   The following portions of the patient's history were reviewed and updated as appropriate: allergies, current medications, past family history, past medical history, past social history, past surgical history and problem list. Problem list updated.  Objective:   Vitals:   09/09/16 1400  BP: 117/74  Pulse: 80  Weight: 185 lb (83.9 kg)    Fetal Status: Fetal Heart Rate (bpm): 145 Fundal Height: 34 cm Movement: Present     General:  Alert, oriented and cooperative. Patient is in no acute distress.  Skin: Skin is warm and dry. No rash noted.   Cardiovascular: Normal heart rate noted  Respiratory: Normal respiratory effort, no problems with respiration noted  Abdomen: Soft, gravid, appropriate for gestational age. Pain/Pressure: Absent     Pelvic:  Cervical exam deferred        Extremities: Normal range of motion.  Edema: None  Mental Status: Normal mood and affect. Normal behavior. Normal judgment and thought content.   Assessment and Plan:  Pregnancy: G1P0000 at 2101w5d  1. Encounter for supervision of normal first pregnancy in third trimester GERD and nausea - pantoprazole (PROTONIX) 20 MG tablet; Take 1 tablet (20 mg total) by mouth daily.  Dispense: 30 tablet; Refill: 1  Preterm labor symptoms and general obstetric precautions including but not limited to vaginal bleeding, contractions, leaking of fluid and fetal movement were reviewed in detail with the  patient. Please refer to After Visit Summary for other counseling recommendations.  Return in about 1 week (around 09/16/2016).   Adam PhenixJames G Zeinab Rodwell, MD

## 2016-09-09 NOTE — Patient Instructions (Signed)

## 2016-09-19 ENCOUNTER — Encounter: Payer: Medicaid Other | Admitting: Certified Nurse Midwife

## 2016-09-22 ENCOUNTER — Other Ambulatory Visit (HOSPITAL_COMMUNITY)
Admission: RE | Admit: 2016-09-22 | Discharge: 2016-09-22 | Disposition: A | Discharge status: Home / Self Care | Payer: Medicaid Other | Source: Ambulatory Visit | Attending: Certified Nurse Midwife | Admitting: Certified Nurse Midwife

## 2016-09-22 ENCOUNTER — Ambulatory Visit (INDEPENDENT_AMBULATORY_CARE_PROVIDER_SITE_OTHER): Payer: Medicaid Other | Admitting: Certified Nurse Midwife

## 2016-09-22 VITALS — BP 108/63 | HR 93 | Wt 185.0 lb

## 2016-09-22 DIAGNOSIS — Z3403 Encounter for supervision of normal first pregnancy, third trimester: Secondary | ICD-10-CM | POA: Insufficient documentation

## 2016-09-22 DIAGNOSIS — Z3A36 36 weeks gestation of pregnancy: Secondary | ICD-10-CM | POA: Diagnosis not present

## 2016-09-22 DIAGNOSIS — F1210 Cannabis abuse, uncomplicated: Secondary | ICD-10-CM

## 2016-09-22 LAB — OB RESULTS CONSOLE GBS: STREP GROUP B AG: NEGATIVE

## 2016-09-22 NOTE — Patient Instructions (Addendum)
Third Trimester of Pregnancy The third trimester is from week 29 through week 40 (months 7 through 9). The third trimester is a time when the unborn baby (fetus) is growing rapidly. At the end of the ninth month, the fetus is about 20 inches in length and weighs 6-10 pounds. Body changes during your third trimester Your body goes through many changes during pregnancy. The changes vary from woman to woman. During the third trimester:  Your weight will continue to increase. You can expect to gain 25-35 pounds (11-16 kg) by the end of the pregnancy.  You may begin to get stretch marks on your hips, abdomen, and breasts.  You may urinate more often because the fetus is moving lower into your pelvis and pressing on your bladder.  You may develop or continue to have heartburn. This is caused by increased hormones that slow down muscles in the digestive tract.  You may develop or continue to have constipation because increased hormones slow digestion and cause the muscles that push waste through your intestines to relax.  You may develop hemorrhoids. These are swollen veins (varicose veins) in the rectum that can itch or be painful.  You may develop swollen, bulging veins (varicose veins) in your legs.  You may have increased body aches in the pelvis, back, or thighs. This is due to weight gain and increased hormones that are relaxing your joints.  You may have changes in your hair. These can include thickening of your hair, rapid growth, and changes in texture. Some women also have hair loss during or after pregnancy, or hair that feels dry or thin. Your hair will most likely return to normal after your baby is born.  Your breasts will continue to grow and they will continue to become tender. A yellow fluid (colostrum) may leak from your breasts. This is the first milk you are producing for your baby.  Your belly button may stick out.  You may notice more swelling in your hands, face, or  ankles.  You may have increased tingling or numbness in your hands, arms, and legs. The skin on your belly may also feel numb.  You may feel short of breath because of your expanding uterus.  You may have more problems sleeping. This can be caused by the size of your belly, increased need to urinate, and an increase in your body's metabolism.  You may notice the fetus "dropping," or moving lower in your abdomen.  You may have increased vaginal discharge.  Your cervix becomes thin and soft (effaced) near your due date. What to expect at prenatal visits You will have prenatal exams every 2 weeks until week 36. Then you will have weekly prenatal exams. During a routine prenatal visit:  You will be weighed to make sure you and the fetus are growing normally.  Your blood pressure will be taken.  Your abdomen will be measured to track your baby's growth.  The fetal heartbeat will be listened to.  Any test results from the previous visit will be discussed.  You may have a cervical check near your due date to see if you have effaced. At around 36 weeks, your health care provider will check your cervix. At the same time, your health care provider will also perform a test on the secretions of the vaginal tissue. This test is to determine if a type of bacteria, Group B streptococcus, is present. Your health care provider will explain this further. Your health care provider may ask you:    What your birth plan is.  How you are feeling.  If you are feeling the baby move.  If you have had any abnormal symptoms, such as leaking fluid, bleeding, severe headaches, or abdominal cramping.  If you are using any tobacco products, including cigarettes, chewing tobacco, and electronic cigarettes.  If you have any questions. Other tests or screenings that may be performed during your third trimester include:  Blood tests that check for low iron levels (anemia).  Fetal testing to check the health,  activity level, and growth of the fetus. Testing is done if you have certain medical conditions or if there are problems during the pregnancy.  Nonstress test (NST). This test checks the health of your baby to make sure there are no signs of problems, such as the baby not getting enough oxygen. During this test, a belt is placed around your belly. The baby is made to move, and its heart rate is monitored during movement. What is false labor? False labor is a condition in which you feel small, irregular tightenings of the muscles in the womb (contractions) that eventually go away. These are called Braxton Hicks contractions. Contractions may last for hours, days, or even weeks before true labor sets in. If contractions come at regular intervals, become more frequent, increase in intensity, or become painful, you should see your health care provider. What are the signs of labor?  Abdominal cramps.  Regular contractions that start at 10 minutes apart and become stronger and more frequent with time.  Contractions that start on the top of the uterus and spread down to the lower abdomen and back.  Increased pelvic pressure and dull back pain.  A watery or bloody mucus discharge that comes from the vagina.  Leaking of amniotic fluid. This is also known as your "water breaking." It could be a slow trickle or a gush. Let your doctor know if it has a color or strange odor. If you have any of these signs, call your health care provider right away, even if it is before your due date. Follow these instructions at home: Eating and drinking  Continue to eat regular, healthy meals.  Do not eat:  Raw meat or meat spreads.  Unpasteurized milk or cheese.  Unpasteurized juice.  Store-made salad.  Refrigerated smoked seafood.  Hot dogs or deli meat, unless they are piping hot.  More than 6 ounces of albacore tuna a week.  Shark, swordfish, king mackerel, or tile fish.  Store-made salads.  Raw  sprouts, such as mung bean or alfalfa sprouts.  Take prenatal vitamins as told by your health care provider.  Take 1000 mg of calcium daily as told by your health care provider.  If you develop constipation:  Take over-the-counter or prescription medicines.  Drink enough fluid to keep your urine clear or pale yellow.  Eat foods that are high in fiber, such as fresh fruits and vegetables, whole grains, and beans.  Limit foods that are high in fat and processed sugars, such as fried and sweet foods. Activity  Exercise only as directed by your health care provider. Healthy pregnant women should aim for 2 hours and 30 minutes of moderate exercise per week. If you experience any pain or discomfort while exercising, stop.  Avoid heavy lifting.  Do not exercise in extreme heat or humidity, or at high altitudes.  Wear low-heel, comfortable shoes.  Practice good posture.  Do not travel far distances unless it is absolutely necessary and only with the approval   of your health care provider.  Wear your seat belt at all times while in a car, on a bus, or on a plane.  Take frequent breaks and rest with your legs elevated if you have leg cramps or low back pain.  Do not use hot tubs, steam rooms, or saunas.  You may continue to have sex unless your health care provider tells you otherwise. Lifestyle  Do not use any products that contain nicotine or tobacco, such as cigarettes and e-cigarettes. If you need help quitting, ask your health care provider.  Do not drink alcohol.  Do not use any medicinal herbs or unprescribed drugs. These chemicals affect the formation and growth of the baby.  If you develop varicose veins:  Wear support pantyhose or compression stockings as told by your healthcare provider.  Elevate your feet for 15 minutes, 3-4 times a day.  Wear a supportive maternity bra to help with breast tenderness. General instructions  Take over-the-counter and prescription  medicines only as told by your health care provider. There are medicines that are either safe or unsafe to take during pregnancy.  Take warm sitz baths to soothe any pain or discomfort caused by hemorrhoids. Use hemorrhoid cream or witch hazel if your health care provider approves.  Avoid cat litter boxes and soil used by cats. These carry germs that can cause birth defects in the baby. If you have a cat, ask someone to clean the litter box for you.  To prepare for the arrival of your baby:  Take prenatal classes to understand, practice, and ask questions about the labor and delivery.  Make a trial run to the hospital.  Visit the hospital and tour the maternity area.  Arrange for maternity or paternity leave through employers.  Arrange for family and friends to take care of pets while you are in the hospital.  Purchase a rear-facing car seat and make sure you know how to install it in your car.  Pack your hospital bag.  Prepare the baby's nursery. Make sure to remove all pillows and stuffed animals from the baby's crib to prevent suffocation.  Visit your dentist if you have not gone during your pregnancy. Use a soft toothbrush to brush your teeth and be gentle when you floss.  Keep all prenatal follow-up visits as told by your health care provider. This is important. Contact a health care provider if:  You are unsure if you are in labor or if your water has broken.  You become dizzy.  You have mild pelvic cramps, pelvic pressure, or nagging pain in your abdominal area.  You have lower back pain.  You have persistent nausea, vomiting, or diarrhea.  You have an unusual or bad smelling vaginal discharge.  You have pain when you urinate. Get help right away if:  You have a fever.  You are leaking fluid from your vagina.  You have spotting or bleeding from your vagina.  You have severe abdominal pain or cramping.  You have rapid weight loss or weight gain.  You have  shortness of breath with chest pain.  You notice sudden or extreme swelling of your face, hands, ankles, feet, or legs.  Your baby makes fewer than 10 movements in 2 hours.  You have severe headaches that do not go away with medicine.  You have vision changes. Summary  The third trimester is from week 29 through week 40, months 7 through 9. The third trimester is a time when the unborn baby (fetus)   is growing rapidly.  During the third trimester, your discomfort may increase as you and your baby continue to gain weight. You may have abdominal, leg, and back pain, sleeping problems, and an increased need to urinate.  During the third trimester your breasts will keep growing and they will continue to become tender. A yellow fluid (colostrum) may leak from your breasts. This is the first milk you are producing for your baby.  False labor is a condition in which you feel small, irregular tightenings of the muscles in the womb (contractions) that eventually go away. These are called Braxton Hicks contractions. Contractions may last for hours, days, or even weeks before true labor sets in.  Signs of labor can include: abdominal cramps; regular contractions that start at 10 minutes apart and become stronger and more frequent with time; watery or bloody mucus discharge that comes from the vagina; increased pelvic pressure and dull back pain; and leaking of amniotic fluid. This information is not intended to replace advice given to you by your health care provider. Make sure you discuss any questions you have with your health care provider. Document Released: 07/22/2001 Document Revised: 01/03/2016 Document Reviewed: 09/28/2012 Elsevier Interactive Patient Education  2017 [employer]  

## 2016-09-22 NOTE — Progress Notes (Signed)
   PRENATAL VISIT NOTE  Subjective:  Lauren Costa is a 21 y.o. G1P0000 at 3589w4d being seen today for ongoing prenatal care.  She is currently monitored for the following issues for this low-risk pregnancy and has Supervision of normal first pregnancy; Substance abuse affecting pregnancy, antepartum; Bilateral breast lump; and Mild tetrahydrocannabinol (THC) abuse on her problem list.  Patient reports no complaints.  Contractions: Not present. Vag. Bleeding: None.  Movement: Present. Denies leaking of fluid.   The following portions of the patient's history were reviewed and updated as appropriate: allergies, current medications, past family history, past medical history, past social history, past surgical history and problem list. Problem list updated.  Objective:   Vitals:   09/22/16 1601  BP: 108/63  Pulse: 93  Weight: 185 lb (83.9 kg)    Fetal Status: Fetal Heart Rate (bpm): 145 Fundal Height: 36 cm Movement: Present  Presentation: Vertex  General:  Alert, oriented and cooperative. Patient is in no acute distress.  Skin: Skin is warm and dry. No rash noted.   Cardiovascular: Normal heart rate noted  Respiratory: Normal respiratory effort, no problems with respiration noted  Abdomen: Soft, gravid, appropriate for gestational age. Pain/Pressure: Present     Pelvic:  Cervical exam performed Dilation: Closed Effacement (%): 0 Station: -3  Extremities: Normal range of motion.     Mental Status: Normal mood and affect. Normal behavior. Normal judgment and thought content.   Assessment and Plan:  Pregnancy: G1P0000 at 4789w4d  1. Encounter for supervision of normal first pregnancy in third trimester     Doing well - Culture, beta strep (group b only) - Cervicovaginal ancillary only  2. Mild tetrahydrocannabinol (THC) abuse       Preterm labor symptoms and general obstetric precautions including but not limited to vaginal bleeding, contractions, leaking of fluid and fetal movement  were reviewed in detail with the patient. Please refer to After Visit Summary for other counseling recommendations.  Return in about 1 week (around 09/29/2016) for ROB.   Roe Coombsachelle A Shawny Borkowski, CNM

## 2016-09-23 LAB — CERVICOVAGINAL ANCILLARY ONLY
BACTERIAL VAGINITIS: NEGATIVE
Candida vaginitis: NEGATIVE
Chlamydia: NEGATIVE
Neisseria Gonorrhea: NEGATIVE
Trichomonas: NEGATIVE

## 2016-09-26 LAB — CULTURE, BETA STREP (GROUP B ONLY): STREP GP B CULTURE: NEGATIVE

## 2016-09-30 ENCOUNTER — Ambulatory Visit (INDEPENDENT_AMBULATORY_CARE_PROVIDER_SITE_OTHER): Payer: Medicaid Other | Admitting: Certified Nurse Midwife

## 2016-09-30 VITALS — BP 127/70 | HR 84 | Wt 185.0 lb

## 2016-09-30 DIAGNOSIS — F1910 Other psychoactive substance abuse, uncomplicated: Secondary | ICD-10-CM

## 2016-09-30 DIAGNOSIS — O99320 Drug use complicating pregnancy, unspecified trimester: Secondary | ICD-10-CM

## 2016-09-30 DIAGNOSIS — O99323 Drug use complicating pregnancy, third trimester: Secondary | ICD-10-CM

## 2016-09-30 DIAGNOSIS — Z3403 Encounter for supervision of normal first pregnancy, third trimester: Secondary | ICD-10-CM

## 2016-09-30 NOTE — Progress Notes (Signed)
   PRENATAL VISIT NOTE  Subjective:  Lauren Costa is a 21 y.o. G1P0000 at 3749w5d being seen today for ongoing prenatal care.  She is currently monitored for the following issues for this low-risk pregnancy and has Supervision of normal first pregnancy; Substance abuse affecting pregnancy, antepartum; Bilateral breast lump; and Mild tetrahydrocannabinol (THC) abuse on her problem list.  Patient reports no complaints.  Contractions: Not present. Vag. Bleeding: None.  Movement: Present. Denies leaking of fluid.   The following portions of the patient's history were reviewed and updated as appropriate: allergies, current medications, past family history, past medical history, past social history, past surgical history and problem list. Problem list updated.  Objective:   Vitals:   09/30/16 1123  BP: 127/70  Pulse: 84  Weight: 185 lb (83.9 kg)    Fetal Status: Fetal Heart Rate (bpm): 133 Fundal Height: 37 cm Movement: Present     General:  Alert, oriented and cooperative. Patient is in no acute distress.  Skin: Skin is warm and dry. No rash noted.   Cardiovascular: Normal heart rate noted  Respiratory: Normal respiratory effort, no problems with respiration noted  Abdomen: Soft, gravid, appropriate for gestational age. Pain/Pressure: Present     Pelvic:  Cervical exam deferred        Extremities: Normal range of motion.  Edema: None  Mental Status: Normal mood and affect. Normal behavior. Normal judgment and thought content.   Assessment and Plan:  Pregnancy: G1P0000 at 7749w5d  1. Encounter for supervision of normal first pregnancy in third trimester    Doing well.    2. Substance abuse affecting pregnancy, antepartum      Not currently using.  [facility]   Term labor symptoms and general obstetric precautions including but not limited to vaginal bleeding, contractions, leaking of fluid and fetal movement were reviewed in detail with the patient. Please refer to After  Visit Summary for other counseling recommendations.  Return in about 1 week (around 10/07/2016) for ROB.   Roe Coombsachelle A Lue Sykora, CNM

## 2016-10-04 ENCOUNTER — Encounter (HOSPITAL_COMMUNITY): Payer: Self-pay | Admitting: *Deleted

## 2016-10-04 ENCOUNTER — Inpatient Hospital Stay (HOSPITAL_COMMUNITY)
Admission: AD | Admit: 2016-10-04 | Discharge: 2016-10-04 | Disposition: A | Discharge status: Home / Self Care | Payer: Medicaid Other | Source: Ambulatory Visit | Attending: Obstetrics and Gynecology | Admitting: Obstetrics and Gynecology

## 2016-10-04 DIAGNOSIS — O26893 Other specified pregnancy related conditions, third trimester: Secondary | ICD-10-CM | POA: Diagnosis not present

## 2016-10-04 DIAGNOSIS — O133 Gestational [pregnancy-induced] hypertension without significant proteinuria, third trimester: Secondary | ICD-10-CM | POA: Diagnosis not present

## 2016-10-04 DIAGNOSIS — O471 False labor at or after 37 completed weeks of gestation: Secondary | ICD-10-CM | POA: Diagnosis not present

## 2016-10-04 DIAGNOSIS — N930 Postcoital and contact bleeding: Secondary | ICD-10-CM | POA: Diagnosis present

## 2016-10-04 DIAGNOSIS — Z3A38 38 weeks gestation of pregnancy: Secondary | ICD-10-CM | POA: Insufficient documentation

## 2016-10-04 DIAGNOSIS — O163 Unspecified maternal hypertension, third trimester: Secondary | ICD-10-CM

## 2016-10-04 DIAGNOSIS — Z87891 Personal history of nicotine dependence: Secondary | ICD-10-CM | POA: Insufficient documentation

## 2016-10-04 DIAGNOSIS — R030 Elevated blood-pressure reading, without diagnosis of hypertension: Secondary | ICD-10-CM | POA: Insufficient documentation

## 2016-10-04 LAB — PROTEIN / CREATININE RATIO, URINE
CREATININE, URINE: 255 mg/dL
Protein Creatinine Ratio: 0.09 mg/mg{Cre} (ref 0.00–0.15)
Total Protein, Urine: 24 mg/dL

## 2016-10-04 LAB — CBC
HCT: 29.7 % — ABNORMAL LOW (ref 36.0–46.0)
Hemoglobin: 10.3 g/dL — ABNORMAL LOW (ref 12.0–15.0)
MCH: 28.3 pg (ref 26.0–34.0)
MCHC: 34.7 g/dL (ref 30.0–36.0)
MCV: 81.6 fL (ref 78.0–100.0)
PLATELETS: 163 10*3/uL (ref 150–400)
RBC: 3.64 MIL/uL — ABNORMAL LOW (ref 3.87–5.11)
RDW: 13.1 % (ref 11.5–15.5)
WBC: 6.9 10*3/uL (ref 4.0–10.5)

## 2016-10-04 LAB — COMPREHENSIVE METABOLIC PANEL
ALT: 20 U/L (ref 14–54)
AST: 24 U/L (ref 15–41)
Albumin: 3.1 g/dL — ABNORMAL LOW (ref 3.5–5.0)
Alkaline Phosphatase: 116 U/L (ref 38–126)
Anion gap: 9 (ref 5–15)
BUN: 10 mg/dL (ref 6–20)
CHLORIDE: 105 mmol/L (ref 101–111)
CO2: 21 mmol/L — ABNORMAL LOW (ref 22–32)
CREATININE: 0.55 mg/dL (ref 0.44–1.00)
Calcium: 8.8 mg/dL — ABNORMAL LOW (ref 8.9–10.3)
Glucose, Bld: 81 mg/dL (ref 65–99)
POTASSIUM: 3.4 mmol/L — AB (ref 3.5–5.1)
Sodium: 135 mmol/L (ref 135–145)
Total Bilirubin: 0.5 mg/dL (ref 0.3–1.2)
Total Protein: 7.2 g/dL (ref 6.5–8.1)

## 2016-10-04 NOTE — MAU Provider Note (Signed)
Chief Complaint: Vaginal Bleeding   First Provider Initiated Contact with Patient 10/04/16 [phone]     SUBJECTIVE HPI: Lauren Costa is a 21 y.o. G1P0000 at [redacted]w[redacted]d who presents to Maternity Admissions reporting vaginal bleeding after intercourse with an elevated BP.  Location: vaginal Quality: bright red w/o clots Severity: light Duration: one episode Context: intercourse yesterday Timing: this morning Modifying factors: none Associated signs and symptoms: no abdominal pain, pelvic discomfort  Denies contractions, leakage of fluid or vaginal bleeding. Good fetal movement.    She denies headache, blurred vision, RUQ pain or worsening swelling. She has no history of elevated BP.  Pregnancy Course:   History reviewed. No pertinent past medical history. OB History  Gravida Para Term Preterm AB Living  1       0    SAB TAB Ectopic Multiple Live Births  0            # Outcome Date GA Lbr Len/2nd Weight Sex Delivery Anes PTL Lv  1 Current              Past Surgical History:  Procedure Laterality Date  . DENTAL SURGERY     Family History  Problem Relation Age of Onset  . Cancer Maternal Aunt   . Cancer Maternal Uncle   . Alcohol abuse Neg Hx   . Arthritis Neg Hx   . Asthma Neg Hx   . Birth defects Neg Hx   . COPD Neg Hx   . Depression Neg Hx   . Diabetes Neg Hx   . Drug abuse Neg Hx   . Early death Neg Hx   . Hearing loss Neg Hx   . Heart disease Neg Hx   . Hyperlipidemia Neg Hx   . Hypertension Neg Hx   . Kidney disease Neg Hx   . Learning disabilities Neg Hx   . Mental illness Neg Hx   . Mental retardation Neg Hx   . Miscarriages / Stillbirths Neg Hx   . Stroke Neg Hx   . Vision loss Neg Hx   . Varicose Veins Neg Hx    Social History  Substance Use Topics  . Smoking status: Former Games developer  . Smokeless tobacco: Former Neurosurgeon  . Alcohol use No   Allergies  Allergen Reactions  . Peanut-Containing Drug Products Hives   Prescriptions Prior to Admission   Medication Sig Dispense Refill Last Dose  . Prenatal Vit-Fe Fumarate-FA (PRENATAL MULTIVITAMIN) TABS tablet Take 1 tablet by mouth daily.    10/03/2016 at Unknown time  . pantoprazole (PROTONIX) 20 MG tablet Take 1 tablet (20 mg total) by mouth daily. (Patient not taking: Reported on 10/04/2016) 30 tablet 1 Not Taking at Unknown time    I have reviewed patient's Past Medical Hx, Surgical Hx, Family Hx, Social Hx, medications and allergies.   ROS:  Review of Systems  Constitutional: Negative for chills, diaphoresis, fatigue and fever.  HENT: Negative for congestion and sore throat.   Respiratory: Negative for cough, shortness of breath and wheezing.   Cardiovascular: Negative for chest pain, palpitations and leg swelling.  Gastrointestinal: Negative for abdominal pain, blood in stool, constipation, diarrhea, nausea and vomiting.  Genitourinary: Positive for vaginal bleeding. Negative for dysuria, flank pain, hematuria, pelvic pain, urgency, vaginal discharge and vaginal pain.  Musculoskeletal: Positive for back pain. Negative for neck pain.  Neurological: Negative for dizziness and headaches.    Physical Exam   Patient Vitals for the past 24 hrs:  BP Temp Pulse Resp  Height Weight  10/04/16 1016 123/59 - 76 - - -  10/04/16 0832 123/59 - 76 - - -  10/04/16 0817 117/75 - 83 - - -  10/04/16 0802 133/68 - 82 - - -  10/04/16 0800 140/96 98.6 F (37 C) 78 18 - -  10/04/16 0754 - - - - 5\' 4"  (1.626 m) 186 lb (84.4 kg)   Constitutional: Well-developed, well-nourished female in no acute distress.  Cardiovascular: normal rate Respiratory: normal effort GI: Abd soft, non-tender, gravid appropriate for gestational age. Pos BS x 4 MS: Extremities nontender, no edema, normal ROM Neurologic: Alert and oriented x 4.  GU: Neg CVAT.  Pelvic: NEFG, physiologic discharge, no blood, cervix clean. No CMT  Dilation:  (scant amount of bleeding noted on exam glove) Effacement (%): Thick Station:  -3 Exam by:: Ginger Morris RN  FHT:  Baseline 130 , moderate variability, accelerations present, no decelerations Contractions: none   Labs: Results for orders placed or performed during the hospital encounter of 10/04/16 (from the past 24 hour(s))  CBC     Status: Abnormal   Collection Time: 10/04/16  8:26 AM  Result Value Ref Range   WBC 6.9 4.0 - 10.5 K/uL   RBC 3.64 (L) 3.87 - 5.11 MIL/uL   Hemoglobin 10.3 (L) 12.0 - 15.0 g/dL   HCT 40.929.7 (L) 81.136.0 - 91.446.0 %   MCV 81.6 78.0 - 100.0 fL   MCH 28.3 26.0 - 34.0 pg   MCHC 34.7 30.0 - 36.0 g/dL   RDW 78.213.1 95.611.5 - 21.315.5 %   Platelets 163 150 - 400 K/uL  Comprehensive metabolic panel     Status: Abnormal   Collection Time: 10/04/16  8:26 AM  Result Value Ref Range   Sodium 135 135 - 145 mmol/L   Potassium 3.4 (L) 3.5 - 5.1 mmol/L   Chloride 105 101 - 111 mmol/L   CO2 21 (L) 22 - 32 mmol/L   Glucose, Bld 81 65 - 99 mg/dL   BUN 10 6 - 20 mg/dL   Creatinine, Ser 0.860.55 0.44 - 1.00 mg/dL   Calcium 8.8 (L) 8.9 - 10.3 mg/dL   Total Protein 7.2 6.5 - 8.1 g/dL   Albumin 3.1 (L) 3.5 - 5.0 g/dL   AST 24 15 - 41 U/L   ALT 20 14 - 54 U/L   Alkaline Phosphatase 116 38 - 126 U/L   Total Bilirubin 0.5 0.3 - 1.2 mg/dL   GFR calc non Af Amer >60 >60 mL/min   GFR calc Af Amer >60 >60 mL/min   Anion gap 9 5 - 15  Protein / creatinine ratio, urine     Status: None   Collection Time: 10/04/16  9:30 AM  Result Value Ref Range   Creatinine, Urine 255.00 mg/dL   Total Protein, Urine 24 mg/dL   Protein Creatinine Ratio 0.09 0.00 - 0.15 mg/mg[Cre]    Imaging:  No results found.  MAU Course: Orders Placed This Encounter  Procedures  . CBC  . Comprehensive metabolic panel  . Protein / creatinine ratio, urine  . Fetal non-stress test  . Measure blood pressure  . Discharge patient   No orders of the defined types were placed in this encounter.   Assessment: 1. Elevated blood pressure affecting pregnancy in third trimester, antepartum   2.  False labor after 37 completed weeks of gestation    Solitary episode of light bleeding this morning after intercourse night before. No signs of active bleeding or leakage of  fluid. W/o abdominal tenderness or contractions. Fetal strip reassuring. No signs of active labor. Solitary episode of HTn resolved with normotensive BP on cycle. Labs are normal and reassuring.  Plan: --Discharge home in stable condition --Patient instructed to f/u with OB on 2/27 --Discussed reasons to return to MAU including recurrent bleeding or new onset abdominal pain or contractions   Allergies as of 10/04/2016      Reactions   Peanut-containing Drug Products Hives      Medication List    TAKE these medications   pantoprazole 20 MG tablet Commonly known as:  PROTONIX Take 1 tablet (20 mg total) by mouth daily.   prenatal multivitamin Tabs tablet Take 1 tablet by mouth daily.       Wendee Beavers, DO, PGY-1 10/04/2016, 10:17 AM   OB FELLOW DISCHARGE ATTESTATION  I have seen and examined this patient and agree with above documentation in the resident's note.    Patient is 21 y/o G1P0 at 38wks and 2 days with some back pain and vaginal bleeding following intercourse. On admission to MAU patient also had one elevated BP that quickly resolved. She has no symptoms of Pre-eclampsia  Gen: Well appearing NAD CV: Regular rate and intact distal pulses Pulm: no respiratory distress Abd: Soft NT/ND  FHTS: 140's/mod var/reactive Toco: uterine irritability  A/P elevated BP in preg - resolved sponatanteously, labs normal Vaginal bleeding: normal after sex, no cervical change labor precautions given.  Ernestina Penna, MD 10:28 AM

## 2016-10-04 NOTE — MAU Note (Signed)
Pt presents to MAU with complaints of small amount of vaginal bleeding this morning. Pt had intercourse last night. Denies pain at present

## 2016-10-04 NOTE — Discharge Instructions (Signed)
Hypertension During Pregnancy Hypertension, commonly called high blood pressure, is when the force of blood pumping through your arteries is too strong. Arteries are blood vessels that carry blood from the heart throughout the body. Hypertension during pregnancy can cause problems for you and your baby. Your baby may be born early (prematurely) or may not weigh as much as he or she should at birth. Very bad cases of hypertension during pregnancy can be life-threatening. Different types of hypertension can occur during pregnancy. These include:  Chronic hypertension. This happens when:  You have hypertension before pregnancy and it continues during pregnancy.  You develop hypertension before you are [redacted] weeks pregnant, and it continues during pregnancy.  Gestational hypertension. This is hypertension that develops after the 20th week of pregnancy.  Preeclampsia, also called toxemia of pregnancy. This is a very serious type of hypertension that develops only during pregnancy. It affects the whole body, and it can be very dangerous for you and your baby. Gestational hypertension and preeclampsia usually go away within 6 weeks after your baby is born. Women who have hypertension during pregnancy have a greater chance of developing hypertension later in life or during future pregnancies. What are the causes? The exact cause of hypertension is not known. What increases the risk? There are certain factors that make it more likely for you to develop hypertension during pregnancy. These include:  Having hypertension during a previous pregnancy or prior to pregnancy.  Being overweight.  Being older than age 40.  Being pregnant for the first time or being pregnant with more than one baby.  Becoming pregnant using fertilization methods such as IVF (in vitro fertilization).  Having diabetes, kidney problems, or systemic lupus erythematosus.  Having a family history of hypertension. What are the  signs or symptoms? Chronic hypertension and gestational hypertension rarely cause symptoms. Preeclampsia causes symptoms, which may include:  Increased protein in your urine. Your health care provider will check for this at every visit before you give birth (prenatal visit).  Severe headaches.  Sudden weight gain.  Swelling of the hands, face, legs, and feet.  Nausea and vomiting.  Vision problems, such as blurred or double vision.  Numbness in the face, arms, legs, and feet.  Dizziness.  Slurred speech.  Sensitivity to bright lights.  Abdominal pain.  Convulsions. How is this diagnosed? You may be diagnosed with hypertension during a routine prenatal exam. At each prenatal visit, you may:  Have a urine test to check for high amounts of protein in your urine.  Have your blood pressure checked. A blood pressure reading is recorded as two numbers, such as "120 over 80" (or 120/80). The first ("top") number is called the systolic pressure. It is a measure of the pressure in your arteries when your heart beats. The second ("bottom") number is called the diastolic pressure. It is a measure of the pressure in your arteries as your heart relaxes between beats. Blood pressure is measured in a unit called mm Hg. A normal blood pressure reading is:  Systolic: below 120.  Diastolic: below 80. The type of hypertension that you are diagnosed with depends on your test results and when your symptoms developed.  Chronic hypertension is usually diagnosed before 20 weeks of pregnancy.  Gestational hypertension is usually diagnosed after 20 weeks of pregnancy.  Hypertension with high amounts of protein in the urine is diagnosed as preeclampsia.  Blood pressure measurements that stay above 160 systolic, or above 110 diastolic, are signs of severe preeclampsia.   How is this treated? Treatment for hypertension during pregnancy varies depending on the type of hypertension you have and how  serious it is.  If you take medicines called ACE inhibitors to treat chronic hypertension, you may need to switch medicines. ACE inhibitors should not be taken during pregnancy.  If you have gestational hypertension, you may need to take blood pressure medicine.  If you are at risk for preeclampsia, your health care provider may recommend that you take a low-dose aspirin every day to prevent high blood pressure during your pregnancy.  If you have severe preeclampsia, you may need to be hospitalized so you and your baby can be monitored closely. You may also need to take medicine (magnesium sulfate) to prevent seizures and to lower blood pressure. This medicine may be given as an injection or through an IV tube.  In some cases, if your condition gets worse, you may need to deliver your baby early. Follow these instructions at home: Eating and drinking  Drink enough fluid to keep your urine clear or pale yellow.  Eat a healthy diet that is low in salt (sodium). Do not add salt to your food. Check food labels to see how much sodium a food or beverage contains. Lifestyle  Do not use any products that contain nicotine or tobacco, such as cigarettes and e-cigarettes. If you need help quitting, ask your health care provider.  Do not use alcohol.  Avoid caffeine.  Avoid stress as much as possible. Rest and get plenty of sleep. General instructions  Take over-the-counter and prescription medicines only as told by your health care provider.  While lying down, lie on your left side. This keeps pressure off your baby.  While sitting or lying down, raise (elevate) your feet. Try putting some pillows under your lower legs.  Exercise regularly. Ask your health care provider what kinds of exercise are best for you.  Keep all prenatal and follow-up visits as told by your health care provider. This is important. Contact a health care provider if:  You have symptoms that your health care provider  told you may require more treatment or monitoring, such as:  Fever.  Vomiting.  Headache. Get help right away if:  You have severe abdominal pain or vomiting that does not get better with treatment.  You suddenly develop swelling in your hands, ankles, or face.  You gain 4 lbs (1.8 kg) or more in 1 week.  You develop vaginal bleeding, or you have blood in your urine.  You do not feel your baby moving as much as usual.  You have blurred or double vision.  You have muscle twitching or sudden tightening (spasms).  You have shortness of breath.  Your lips or fingernails turn blue. This information is not intended to replace advice given to you by your health care provider. Make sure you discuss any questions you have with your health care provider. Document Released: 04/15/2011 Document Revised: 02/15/2016 Document Reviewed: 01/11/2016 Elsevier Interactive Patient Education  2017 [employer]   Braxton Hicks Contractions Contractions of the uterus can occur throughout pregnancy. Contractions are not always a sign that you are in labor.  WHAT ARE BRAXTON HICKS CONTRACTIONS?  Contractions that occur before labor are called Braxton Hicks contractions, or false labor. Toward the end of pregnancy (32-34 weeks), these contractions can develop more often and may become more forceful. This is not true labor because these contractions do not result in opening (dilatation) and thinning of the cervix. They are   sometimes difficult to tell apart from true labor because these contractions can be forceful and people have different pain tolerances. You should not feel embarrassed if you go to the hospital with false labor. Sometimes, the only way to tell if you are in true labor is for your health care provider to look for changes in the cervix. If there are no prenatal problems or other health problems associated with the pregnancy, it is completely safe to be sent home with false labor and await  the onset of true labor. HOW CAN YOU TELL THE DIFFERENCE BETWEEN TRUE AND FALSE LABOR? False Labor   The contractions of false labor are usually shorter and not as hard as those of true labor.   The contractions are usually irregular.   The contractions are often felt in the front of the lower abdomen and in the groin.   The contractions may go away when you walk around or change positions while lying down.   The contractions get weaker and are shorter lasting as time goes on.   The contractions do not usually become progressively stronger, regular, and closer together as with true labor.  True Labor   Contractions in true labor last 30-70 seconds, become very regular, usually become more intense, and increase in frequency.   The contractions do not go away with walking.   The discomfort is usually felt in the top of the uterus and spreads to the lower abdomen and low back.   True labor can be determined by your health care provider with an exam. This will show that the cervix is dilating and getting thinner.  WHAT TO REMEMBER  Keep up with your usual exercises and follow other instructions given by your health care provider.   Take medicines as directed by your health care provider.   Keep your regular prenatal appointments.   Eat and drink lightly if you think you are going into labor.   If Braxton Hicks contractions are making you uncomfortable:   Change your position from lying down or resting to walking, or from walking to resting.   Sit and rest in a tub of warm water.   Drink 2-3 glasses of water. Dehydration may cause these contractions.   Do slow and deep breathing several times an hour.  WHEN SHOULD I SEEK IMMEDIATE MEDICAL CARE? Seek immediate medical care if:  Your contractions become stronger, more regular, and closer together.   You have fluid leaking or gushing from your vagina.   You have a fever.   You pass blood-tinged mucus.    You have vaginal bleeding.   You have continuous abdominal pain.   You have low back pain that you never had before.   You feel your baby's head pushing down and causing pelvic pressure.   Your baby is not moving as much as it used to.  This information is not intended to replace advice given to you by your health care provider. Make sure you discuss any questions you have with your health care provider. Document Released: 07/28/2005 Document Revised: 11/19/2015 Document Reviewed: 05/09/2013 Elsevier Interactive Patient Education  2017 Elsevier Inc.  

## 2016-10-07 ENCOUNTER — Ambulatory Visit (INDEPENDENT_AMBULATORY_CARE_PROVIDER_SITE_OTHER): Payer: Medicaid Other | Admitting: Certified Nurse Midwife

## 2016-10-07 VITALS — BP 122/68 | HR 82 | Wt 188.0 lb

## 2016-10-07 DIAGNOSIS — Z3403 Encounter for supervision of normal first pregnancy, third trimester: Secondary | ICD-10-CM

## 2016-10-07 DIAGNOSIS — O99323 Drug use complicating pregnancy, third trimester: Secondary | ICD-10-CM

## 2016-10-07 DIAGNOSIS — F1210 Cannabis abuse, uncomplicated: Secondary | ICD-10-CM

## 2016-10-07 NOTE — Progress Notes (Signed)
   PRENATAL VISIT NOTE  Subjective:  Lauren Costa is a 21 y.o. G1P0000 at 3777w5d being seen today for ongoing prenatal care.  She is currently monitored for the following issues for this low-risk pregnancy and has Supervision of normal first pregnancy; Substance abuse affecting pregnancy, antepartum; Bilateral breast lump; and Mild tetrahydrocannabinol (THC) abuse on her problem list.  Patient reports no complaints.  Contractions: Not present. Vag. Bleeding: None.  Movement: Present. Denies leaking of fluid.   The following portions of the patient's history were reviewed and updated as appropriate: allergies, current medications, past family history, past medical history, past social history, past surgical history and problem list. Problem list updated.  Objective:   Vitals:   10/07/16 1321  BP: 122/68  Pulse: 82  Weight: 188 lb (85.3 kg)    Fetal Status: Fetal Heart Rate (bpm): 145 Fundal Height: 37 cm Movement: Present  Presentation: Vertex  General:  Alert, oriented and cooperative. Patient is in no acute distress.  Skin: Skin is warm and dry. No rash noted.   Cardiovascular: Normal heart rate noted  Respiratory: Normal respiratory effort, no problems with respiration noted  Abdomen: Soft, gravid, appropriate for gestational age. Pain/Pressure: Present     Pelvic:  Cervical exam performed Dilation: Fingertip Effacement (%): 0 Station: -3  Extremities: Normal range of motion.  Edema: None  Mental Status: Normal mood and affect. Normal behavior. Normal judgment and thought content.   Assessment and Plan:  Pregnancy: G1P0000 at 6877w5d  1. Encounter for supervision of normal first pregnancy in third trimester     Recently seen in MAU for spotting after sexual intercourse no bleeding since that time, reports normal fetal movement.   2. Mild tetrahydrocannabinol (THC) abuse     Not currently using  Term labor symptoms and general obstetric precautions including but not limited to  vaginal bleeding, contractions, leaking of fluid and fetal movement were reviewed in detail with the patient. Please refer to After Visit Summary for other counseling recommendations.  Return in about 1 week (around 10/14/2016) for ROB.   Roe Coombsachelle A Tauri Ethington, CNM

## 2016-10-07 NOTE — Progress Notes (Signed)
Pt has questions regarding iron. Recent hgb 10.3

## 2016-10-15 ENCOUNTER — Encounter: Payer: Self-pay | Admitting: Certified Nurse Midwife

## 2016-10-15 ENCOUNTER — Ambulatory Visit (INDEPENDENT_AMBULATORY_CARE_PROVIDER_SITE_OTHER): Payer: Medicaid Other | Admitting: Certified Nurse Midwife

## 2016-10-15 VITALS — BP 122/81 | HR 89 | Wt 191.0 lb

## 2016-10-15 DIAGNOSIS — F1210 Cannabis abuse, uncomplicated: Secondary | ICD-10-CM

## 2016-10-15 DIAGNOSIS — Z3403 Encounter for supervision of normal first pregnancy, third trimester: Secondary | ICD-10-CM

## 2016-10-15 NOTE — Progress Notes (Signed)
Patient reports she is ready to have her baby

## 2016-10-15 NOTE — Progress Notes (Signed)
   PRENATAL VISIT NOTE  Subjective:  Lauren Costa is a 21 y.o. G1P0000 at 632w6d being seen today for ongoing prenatal care.  She is currently monitored for the following issues for this low-risk pregnancy and has Supervision of normal first pregnancy; Substance abuse affecting pregnancy, antepartum; Bilateral breast lump; and Mild tetrahydrocannabinol (THC) abuse on her problem list.  Patient reports no complaints.  Contractions: Irregular. Vag. Bleeding: None.  Movement: Present. Denies leaking of fluid.   The following portions of the patient's history were reviewed and updated as appropriate: allergies, current medications, past family history, past medical history, past social history, past surgical history and problem list. Problem list updated.  Objective:   Vitals:   10/15/16 1506  BP: 122/81  Pulse: 89  Weight: 191 lb (86.6 kg)    Fetal Status: Fetal Heart Rate (bpm): 132 Fundal Height: 39 cm Movement: Present  Presentation: Vertex  General:  Alert, oriented and cooperative. Patient is in no acute distress.  Skin: Skin is warm and dry. No rash noted.   Cardiovascular: Normal heart rate noted  Respiratory: Normal respiratory effort, no problems with respiration noted  Abdomen: Soft, gravid, appropriate for gestational age. Pain/Pressure: Present     Pelvic:  Cervical exam performed Dilation: Fingertip Effacement (%): 0 Station: -3  Extremities: Normal range of motion.  Edema: None  Mental Status: Normal mood and affect. Normal behavior. Normal judgment and thought content.   Assessment and Plan:  Pregnancy: G1P0000 at [redacted]w[redacted]d  1. Encounter for supervision of normal first pregnancy in third trimester     Doing well.    2. Mild tetrahydrocannabinol (THC) abuse       Term labor symptoms and general obstetric precautions including but not limited to vaginal bleeding, contractions, leaking of fluid and fetal movement were reviewed in detail with the patient. Please refer to  After Visit Summary for other counseling recommendations.  Return in about 1 week (around 10/22/2016) for ROB, NST; if not delivered or induced.  IOL form completed.Roe Coombs.   Lauren Costa, CNM

## 2016-10-20 ENCOUNTER — Inpatient Hospital Stay (HOSPITAL_COMMUNITY): Payer: Medicaid Other | Admitting: Anesthesiology

## 2016-10-20 ENCOUNTER — Inpatient Hospital Stay (HOSPITAL_COMMUNITY)
Admission: AD | Admit: 2016-10-20 | Discharge: 2016-10-23 | DRG: 775 | Disposition: A | Discharge status: Home / Self Care | Payer: Medicaid Other | Source: Ambulatory Visit | Attending: Family Medicine | Admitting: Family Medicine

## 2016-10-20 ENCOUNTER — Telehealth (HOSPITAL_COMMUNITY): Payer: Self-pay | Admitting: *Deleted

## 2016-10-20 ENCOUNTER — Encounter (HOSPITAL_COMMUNITY): Payer: Self-pay | Admitting: *Deleted

## 2016-10-20 DIAGNOSIS — Z3493 Encounter for supervision of normal pregnancy, unspecified, third trimester: Secondary | ICD-10-CM | POA: Diagnosis present

## 2016-10-20 DIAGNOSIS — Z87891 Personal history of nicotine dependence: Secondary | ICD-10-CM

## 2016-10-20 DIAGNOSIS — Z3A40 40 weeks gestation of pregnancy: Secondary | ICD-10-CM | POA: Diagnosis not present

## 2016-10-20 DIAGNOSIS — F1290 Cannabis use, unspecified, uncomplicated: Secondary | ICD-10-CM | POA: Diagnosis present

## 2016-10-20 DIAGNOSIS — O99324 Drug use complicating childbirth: Secondary | ICD-10-CM | POA: Diagnosis present

## 2016-10-20 LAB — CBC
HCT: 34.2 % — ABNORMAL LOW (ref 36.0–46.0)
Hemoglobin: 11.4 g/dL — ABNORMAL LOW (ref 12.0–15.0)
MCH: 28.1 pg (ref 26.0–34.0)
MCHC: 33.3 g/dL (ref 30.0–36.0)
MCV: 84.4 fL (ref 78.0–100.0)
PLATELETS: 199 10*3/uL (ref 150–400)
RBC: 4.05 MIL/uL (ref 3.87–5.11)
RDW: 13.7 % (ref 11.5–15.5)
WBC: 8.3 10*3/uL (ref 4.0–10.5)

## 2016-10-20 LAB — URINALYSIS, ROUTINE W REFLEX MICROSCOPIC
Bilirubin Urine: NEGATIVE
Glucose, UA: NEGATIVE mg/dL
KETONES UR: NEGATIVE mg/dL
Nitrite: NEGATIVE
PH: 8 (ref 5.0–8.0)
Protein, ur: 30 mg/dL — AB
Specific Gravity, Urine: 1.023 (ref 1.005–1.030)

## 2016-10-20 LAB — TYPE AND SCREEN
ABO/RH(D): O POS
Antibody Screen: NEGATIVE

## 2016-10-20 LAB — ABO/RH: ABO/RH(D): O POS

## 2016-10-20 MED ORDER — OXYTOCIN 40 UNITS IN LACTATED RINGERS INFUSION - SIMPLE MED
2.5000 [IU]/h | INTRAVENOUS | Status: DC
Start: 2016-10-20 — End: 2016-10-22

## 2016-10-20 MED ORDER — LACTATED RINGERS IV SOLN: 500.0000 mL | Freq: Once | INTRAVENOUS | Status: DC

## 2016-10-20 MED ORDER — DIPHENHYDRAMINE HCL 50 MG/ML IJ SOLN
12.5000 mg | INTRAMUSCULAR | Status: DC | PRN
  Administered 2016-10-21: 12.5 mg via INTRAVENOUS
  Filled 2016-10-20: qty 1

## 2016-10-20 MED ORDER — LACTATED RINGERS IV SOLN: 500.0000 mL | INTRAVENOUS | Status: DC | PRN

## 2016-10-20 MED ORDER — EPHEDRINE 5 MG/ML INJ: 10.0000 mg | INTRAVENOUS | Status: DC | PRN

## 2016-10-20 MED ORDER — OXYCODONE-ACETAMINOPHEN 5-325 MG PO TABS: 1.0000 | ORAL_TABLET | ORAL | Status: DC | PRN

## 2016-10-20 MED ORDER — PHENYLEPHRINE 40 MCG/ML (10ML) SYRINGE FOR IV PUSH (FOR BLOOD PRESSURE SUPPORT): 80.0000 ug | PREFILLED_SYRINGE | INTRAVENOUS | Status: DC | PRN

## 2016-10-20 MED ORDER — ACETAMINOPHEN 325 MG PO TABS: 650.0000 mg | ORAL_TABLET | ORAL | Status: DC | PRN

## 2016-10-20 MED ORDER — FENTANYL 2.5 MCG/ML BUPIVACAINE 1/10 % EPIDURAL INFUSION (WH - ANES): 14.0000 mL/h | INTRAMUSCULAR | Status: DC | PRN

## 2016-10-20 MED ORDER — DIPHENHYDRAMINE HCL 50 MG/ML IJ SOLN: 12.5000 mg | INTRAMUSCULAR | Status: DC | PRN

## 2016-10-20 MED ORDER — LIDOCAINE HCL (PF) 1 % IJ SOLN
30.0000 mL | INTRAMUSCULAR | Status: DC | PRN
  Administered 2016-10-21: 30 mL via SUBCUTANEOUS
  Filled 2016-10-20: qty 30

## 2016-10-20 MED ORDER — PHENYLEPHRINE 40 MCG/ML (10ML) SYRINGE FOR IV PUSH (FOR BLOOD PRESSURE SUPPORT)
PREFILLED_SYRINGE | INTRAVENOUS | Status: AC
  Filled 2016-10-20: qty 20

## 2016-10-20 MED ORDER — ONDANSETRON HCL 4 MG/2ML IJ SOLN
4.0000 mg | Freq: Four times a day (QID) | INTRAMUSCULAR | Status: DC | PRN
  Administered 2016-10-21: 4 mg via INTRAVENOUS
  Filled 2016-10-20: qty 2

## 2016-10-20 MED ORDER — FENTANYL CITRATE (PF) 100 MCG/2ML IJ SOLN: 100.0000 ug | INTRAMUSCULAR | Status: DC | PRN

## 2016-10-20 MED ORDER — SOD CITRATE-CITRIC ACID 500-334 MG/5ML PO SOLN: 30.0000 mL | ORAL | Status: DC | PRN

## 2016-10-20 MED ORDER — LIDOCAINE HCL (PF) 1 % IJ SOLN
INTRAMUSCULAR | Status: DC | PRN
  Administered 2016-10-20: 5 mL via EPIDURAL

## 2016-10-20 MED ORDER — OXYCODONE-ACETAMINOPHEN 5-325 MG PO TABS: 2.0000 | ORAL_TABLET | ORAL | Status: DC | PRN

## 2016-10-20 MED ORDER — FENTANYL 2.5 MCG/ML BUPIVACAINE 1/10 % EPIDURAL INFUSION (WH - ANES)
INTRAMUSCULAR | Status: AC
  Filled 2016-10-20: qty 100

## 2016-10-20 MED ORDER — EPHEDRINE 5 MG/ML INJ
10.0000 mg | INTRAVENOUS | Status: DC | PRN
Start: 2016-10-20 — End: 2016-10-21
  Filled 2016-10-20: qty 4

## 2016-10-20 MED ORDER — EPHEDRINE 5 MG/ML INJ
10.0000 mg | INTRAVENOUS | Status: DC | PRN
  Filled 2016-10-20: qty 4

## 2016-10-20 MED ORDER — PHENYLEPHRINE 40 MCG/ML (10ML) SYRINGE FOR IV PUSH (FOR BLOOD PRESSURE SUPPORT)
80.0000 ug | PREFILLED_SYRINGE | INTRAVENOUS | Status: DC | PRN
  Filled 2016-10-20: qty 5

## 2016-10-20 MED ORDER — LACTATED RINGERS IV SOLN
INTRAVENOUS | Status: DC
  Administered 2016-10-20: 125 mL via INTRAVENOUS

## 2016-10-20 MED ORDER — OXYTOCIN BOLUS FROM INFUSION
500.0000 mL | Freq: Once | INTRAVENOUS | Status: AC
  Administered 2016-10-21: 500 mL via INTRAVENOUS

## 2016-10-20 MED ORDER — FENTANYL 2.5 MCG/ML BUPIVACAINE 1/10 % EPIDURAL INFUSION (WH - ANES)
14.0000 mL/h | INTRAMUSCULAR | Status: DC | PRN
  Administered 2016-10-20 – 2016-10-21 (×2): 14 mL/h via EPIDURAL
  Filled 2016-10-20: qty 100

## 2016-10-20 NOTE — MAU Note (Signed)
Pt states U/C's started last night after intercourse, every 3-5 minutes.  Pt states they are about every 8 minutes now.  Good fetal movement.  Some vaginal spotting.  Denies ROM.

## 2016-10-20 NOTE — Anesthesia Pain Management Evaluation Note (Signed)
  CRNA Pain Management Visit Note  Patient: Lauren Costa, 21 y.o., female  "Hello I am a member of the anesthesia team at [facility]. We have an anesthesia team available at all times to provide care throughout the hospital, including epidural management and anesthesia for C-section. I don't know your plan for the delivery whether it a natural birth, water birth, IV sedation, nitrous supplementation, doula or epidural, but we want to meet your pain goals."   1.Was your pain managed to your expectations on prior hospitalizations?   No prior hospitalizations  2.What is your expectation for pain management during this hospitalization?     Epidural  3.How can we help you reach that goal? Epidural, when ready.  Even though contractions are 7/10, pt not ready for epidural yet.  Record the patient's initial score and the patient's pain goal.   Pain: 7  Pain Goal: 7 [facility] wants you to be able to say your pain was always managed very well.  Tailer Volkert L 10/20/2016

## 2016-10-20 NOTE — H&P (Signed)
LABOR AND DELIVERY ADMISSION HISTORY AND PHYSICAL NOTE  Lauren Costa is a 21 y.o. female G1P0000 with IUP at [redacted]w[redacted]d by LMP consistent with 18 week [place] presenting for normal labor.  Contractions started last night around midnight and have continued to get stronger.  She does have mild bloody show.  She is otherwise feeling well.  She reports positive fetal movement. She denies leakage of fluid.  Prenatal History/Complications:  Past Medical History: History reviewed. No pertinent past medical history.  Past Surgical History: Past Surgical History:  Procedure Laterality Date  . DENTAL SURGERY      Obstetrical History: OB History    Gravida Para Term Preterm AB Living   1       0 0   SAB TAB Ectopic Multiple Live Births   0              Social History: Social History   Social History  . Marital status: Single    Spouse name: N/A  . Number of children: N/A  . Years of education: N/A   Social History Main Topics  . Smoking status: Former Games developer  . Smokeless tobacco: Former Neurosurgeon  . Alcohol use No  . Drug use: No  . Sexual activity: Yes    Partners: Male    Birth control/ protection: None   Other Topics Concern  . None   Social History Narrative  . None    Family History: Family History  Problem Relation Age of Onset  . Cancer Maternal Aunt   . Cancer Maternal Uncle   . Alcohol abuse Neg Hx   . Arthritis Neg Hx   . Asthma Neg Hx   . Birth defects Neg Hx   . COPD Neg Hx   . Depression Neg Hx   . Diabetes Neg Hx   . Drug abuse Neg Hx   . Early death Neg Hx   . Hearing loss Neg Hx   . Heart disease Neg Hx   . Hyperlipidemia Neg Hx   . Hypertension Neg Hx   . Kidney disease Neg Hx   . Learning disabilities Neg Hx   . Mental illness Neg Hx   . Mental retardation Neg Hx   . Miscarriages / Stillbirths Neg Hx   . Stroke Neg Hx   . Vision loss Neg Hx   . Varicose Veins Neg Hx     Allergies: Allergies  Allergen Reactions  . Peanut-Containing Drug  Products Hives    Prescriptions Prior to Admission  Medication Sig Dispense Refill Last Dose  . naproxen sodium (ALEVE) 220 MG tablet Take 220 mg by mouth once.   Past Week at Unknown time  . Prenatal Vit-Fe Fumarate-FA (PRENATAL MULTIVITAMIN) TABS tablet Take 1 tablet by mouth daily.    Past Week at Unknown time  . pantoprazole (PROTONIX) 20 MG tablet Take 1 tablet (20 mg total) by mouth daily. (Patient not taking: Reported on 10/04/2016) 30 tablet 1 Not Taking     Review of Systems   Review of Systems  Constitutional: Negative for fever.  HENT: Negative for congestion and sore throat.   Eyes: Negative for blurred vision.  Respiratory: Negative for shortness of breath.   Cardiovascular: Negative for chest pain.  Gastrointestinal: Negative for abdominal pain.  Genitourinary: Negative for dysuria.  Neurological: Negative for headaches.     Blood pressure 137/76, pulse 97, temperature 98 F (36.7 C), temperature source Oral, resp. rate 20, last menstrual period 01/10/2016, SpO2 100 %. General appearance:  alert, appears stated age and no distress Lungs: normal effort & expansion bilaterally Heart: regular rate, peripheral pulses 2+ & symmetric Abdomen: soft, non-tender; bowel sounds normal Extremities: No calf swelling or tenderness  Presentation: cephalic Fetal monitoring: 140, moderate variability, + accels, no decels - cat I Uterine activity: irregular, around every 4 minutes.  Dilation: 4 Effacement (%): 90 Station: -2 Exam by:: Edger HouseL. Paschal, RN   Prenatal labs: ABO, Rh: O/Positive/-- (08/09 1548) Antibody: Negative (08/09 1548) Rubella: immune RPR: Non Reactive (12/18 1204)  HBsAg: Negative (08/09 1548)  HIV: Non Reactive (12/18 1204)  GBS:  negative 1 hr Glucola: 55 from 74 Genetic screening:  Mat 21 normal Anatomy US: normal at 18 weeks  Prenatal Transfer Tool  Maternal Diabetes: No Genetic Screening: Normal Maternal Ultrasounds/Referrals: Normal Maternal  Substance Abuse:  No Significant Maternal Medications:  Uses naproxen regularly Significant Maternal Lab Results: Lab values include: Group B Strep negative  Results for orders placed or performed during the hospital encounter of 10/20/16 (from the past 24 hour(s))  Urinalysis, Routine w reflex microscopic   Collection Time: 10/20/16  1:00 PM  Result Value Ref Range   Color, Urine YELLOW YELLOW   APPearance HAZY (A) CLEAR   Specific Gravity, Urine 1.023 1.005 - 1.030   pH 8.0 5.0 - 8.0   Glucose, UA NEGATIVE NEGATIVE mg/dL   Hgb urine dipstick SMALL (A) NEGATIVE   Bilirubin Urine NEGATIVE NEGATIVE   Ketones, ur NEGATIVE NEGATIVE mg/dL   Protein, ur 30 (A) NEGATIVE mg/dL   Nitrite NEGATIVE NEGATIVE   Leukocytes, UA SMALL (A) NEGATIVE   RBC / HPF 0-5 0 - 5 RBC/hpf   WBC, UA 6-30 0 - 5 WBC/hpf   Bacteria, UA RARE (A) NONE SEEN   Squamous Epithelial / LPF 6-30 (A) NONE SEEN   Mucous PRESENT     Patient Active Problem List   Diagnosis Date Noted  . Mild tetrahydrocannabinol (THC) abuse 07/17/2016  . Supervision of normal first pregnancy 03/19/2016  . Substance abuse affecting pregnancy, antepartum 03/19/2016  . Bilateral breast lump 03/19/2016    Assessment: Lauren Costa is a 21 y.o. G1P0000 at 3579w4d here for management of spontaneous labor.  #labor:spontaneous, routine care, anticipate NSVD #Pain: Undecided, interested in natural childbirth; discussed fentanyl vs nitrous vs epidural.  -ok for intermittent monitoring, ambulation, saline lock #FWB: Category II - 140 baseline, moderate variability, +accels, intermittent variable decels;  overall reassured by accels and moderate variability #ID:  neg #MOF: breast #MOC:IUD vs DMPA  Lauren Costa 10/20/2016, 4:10 PM

## 2016-10-20 NOTE — Progress Notes (Signed)
Lauren Costa is a 21 y.o. G1P0000 at 4532w4d   Subjective: Patient doing well. No complaints. Says getting up and using ball has helped.  Objective: BP 125/68   Pulse 81   Temp 98.2 F (36.8 C) (Oral)   Resp 20   Ht 5\' 4"  (1.626 m)   Wt 191 lb (86.6 kg)   LMP 01/10/2016 (Within Days)   SpO2 100%   BMI 32.79 kg/m  No intake/output data recorded. No intake/output data recorded.  FHT:  FHR: 140 bpm, variability: moderate,  accelerations:  Present,  decelerations:  Absent UC:   regular, every 2 minutes SVE:   Dilation: 5 Effacement (%): 90 Station: -2 Exam by:: [facility]  (Dr Abelardo DieselMcMullen )  Labs: Lab Results  Component Value Date   WBC 8.3 10/20/2016   HGB 11.4 (L) 10/20/2016   HCT 34.2 (L) 10/20/2016   MCV 84.4 10/20/2016   PLT 199 10/20/2016    Assessment / Plan: Spontaneous labor, progressing normally  Labor: Progressing normally Preeclampsia:  None Fetal Wellbeing:  Category I Pain Control:  Labor support without medications I/D:  GBS negative Anticipated MOD:  NSVD  Wendee Beaversavid J McMullen, DO, PGY-1 10/20/2016, 10:26 PM

## 2016-10-20 NOTE — Anesthesia Preprocedure Evaluation (Signed)
Anesthesia Evaluation  Patient identified by MRN, date of birth, ID band Patient awake    Reviewed: Allergy & Precautions, H&P , NPO status , Patient's Chart, lab work & pertinent test results  Airway Mallampati: II   Neck ROM: full    Dental   Pulmonary former smoker,    breath sounds clear to auscultation       Cardiovascular negative cardio ROS   Rhythm:regular Rate:Normal     Neuro/Psych    GI/Hepatic   Endo/Other    Renal/GU      Musculoskeletal   Abdominal   Peds  Hematology   Anesthesia Other Findings   Reproductive/Obstetrics (+) Pregnancy                             Anesthesia Physical Anesthesia Plan  ASA: II  Anesthesia Plan: Epidural   Post-op Pain Management:    Induction: Intravenous  Airway Management Planned: Natural Airway  Additional Equipment:   Intra-op Plan:   Post-operative Plan:   Informed Consent: I have reviewed the patients History and Physical, chart, labs and discussed the procedure including the risks, benefits and alternatives for the proposed anesthesia with the patient or authorized representative who has indicated his/her understanding and acceptance.     Plan Discussed with: Anesthesiologist  Anesthesia Plan Comments:         Anesthesia Quick Evaluation

## 2016-10-20 NOTE — Progress Notes (Signed)
   Octavio Gravesiasia E Jardin is a 21 y.o. G1P0000 at 264w4d  admitted for SOL.   Subjective: Patient coping well with contractions; alternating between ambulating and resting in bed.   Objective: Vitals:   10/20/16 1313 10/20/16 1620 10/20/16 1626  BP: 137/76 (!) 147/80   Pulse: 97 100   Resp: 20 20   Temp: 98 F (36.7 C) 98.2 F (36.8 C)   TempSrc: Oral Oral   SpO2: 100%    Weight:   86.6 kg (191 lb)  Height:   5\' 4"  (1.626 m)   No intake/output data recorded.  FHT:  FHR: 135 bpm, variability: moderate,  accelerations:  Present,  decelerations:  Absent UC:   irregular, every 5-7 minutes SVE:   Dilation: 4 Effacement (%): 90 Station: -2 Exam by:: L. Paschal, RN  Labs: Lab Results  Component Value Date   WBC 8.3 10/20/2016   HGB 11.4 (L) 10/20/2016   HCT 34.2 (L) 10/20/2016   MCV 84.4 10/20/2016   PLT 199 10/20/2016    Assessment / Plan: Spontaneous labor, progressing normally  Labor: Progressing normally Fetal Wellbeing:  Category I Pain Control:  Labor support without medications Anticipated MOD:  NSVD  Marylene LandKathryn Lorraine Pj Zehner CNM 10/20/2016, 6:44 PM

## 2016-10-20 NOTE — Telephone Encounter (Signed)
Preadmission screen  

## 2016-10-20 NOTE — Anesthesia Procedure Notes (Signed)
Epidural Patient location during procedure: OB Start time: 10/20/2016 11:28 PM End time: 10/20/2016 11:39 PM  Staffing Anesthesiologist: Chaney MallingHODIERNE, Bedelia Pong Performed: anesthesiologist   Preanesthetic Checklist Completed: patient identified, site marked, pre-op evaluation, timeout performed, IV checked, risks and benefits discussed and monitors and equipment checked  Epidural Patient position: sitting Prep: DuraPrep Patient monitoring: heart rate, cardiac monitor, continuous pulse ox and blood pressure Approach: midline Location: L2-L3 Injection technique: LOR air  Needle:  Needle type: Tuohy  Needle gauge: 17 G Needle length: 9 cm Needle insertion depth: 5 cm Catheter type: closed end flexible Catheter size: 19 Gauge Catheter at skin depth: 11 cm Test dose: negative and Other  Assessment Events: blood not aspirated, injection not painful, no injection resistance and negative IV test  Additional Notes Informed consent obtained prior to proceeding including risk of failure, 1% risk of PDPH, risk of minor discomfort and bruising.  Discussed rare but serious complications including epidural abscess, permanent nerve injury, epidural hematoma.  Discussed alternatives to epidural analgesia and patient desires to proceed.  Timeout performed pre-procedure verifying patient name, procedure, and platelet count.  Patient tolerated procedure well. Reason for block:procedure for pain

## 2016-10-21 ENCOUNTER — Encounter (HOSPITAL_COMMUNITY): Payer: Self-pay | Admitting: *Deleted

## 2016-10-21 DIAGNOSIS — Z3A40 40 weeks gestation of pregnancy: Secondary | ICD-10-CM

## 2016-10-21 MED ORDER — DIBUCAINE 1 % RE OINT: 1.0000 "application " | TOPICAL_OINTMENT | RECTAL | Status: DC | PRN

## 2016-10-21 MED ORDER — ONDANSETRON HCL 4 MG PO TABS: 4.0000 mg | ORAL_TABLET | ORAL | Status: DC | PRN

## 2016-10-21 MED ORDER — PNEUMOCOCCAL VAC POLYVALENT 25 MCG/0.5ML IJ INJ
0.5000 mL | INJECTION | INTRAMUSCULAR | Status: DC
  Filled 2016-10-21: qty 0.5

## 2016-10-21 MED ORDER — TETANUS-DIPHTH-ACELL PERTUSSIS 5-2.5-18.5 LF-MCG/0.5 IM SUSP: 0.5000 mL | Freq: Once | INTRAMUSCULAR | Status: DC

## 2016-10-21 MED ORDER — SENNOSIDES-DOCUSATE SODIUM 8.6-50 MG PO TABS
2.0000 | ORAL_TABLET | ORAL | Status: DC
  Administered 2016-10-22 – 2016-10-23 (×2): 2 via ORAL
  Filled 2016-10-21 (×2): qty 2

## 2016-10-21 MED ORDER — PRENATAL MULTIVITAMIN CH
1.0000 | ORAL_TABLET | Freq: Every day | ORAL | Status: DC
  Administered 2016-10-22: 1 via ORAL
  Filled 2016-10-21 (×2): qty 1

## 2016-10-21 MED ORDER — SIMETHICONE 80 MG PO CHEW: 80.0000 mg | CHEWABLE_TABLET | ORAL | Status: DC | PRN

## 2016-10-21 MED ORDER — ONDANSETRON HCL 4 MG/2ML IJ SOLN: 4.0000 mg | INTRAMUSCULAR | Status: DC | PRN

## 2016-10-21 MED ORDER — DIPHENHYDRAMINE HCL 25 MG PO CAPS: 25.0000 mg | ORAL_CAPSULE | Freq: Four times a day (QID) | ORAL | Status: DC | PRN

## 2016-10-21 MED ORDER — WITCH HAZEL-GLYCERIN EX PADS: 1.0000 "application " | MEDICATED_PAD | CUTANEOUS | Status: DC | PRN

## 2016-10-21 MED ORDER — IBUPROFEN 600 MG PO TABS
600.0000 mg | ORAL_TABLET | Freq: Four times a day (QID) | ORAL | Status: DC
  Administered 2016-10-21 – 2016-10-23 (×7): 600 mg via ORAL
  Filled 2016-10-21 (×8): qty 1

## 2016-10-21 MED ORDER — TERBUTALINE SULFATE 1 MG/ML IJ SOLN
0.2500 mg | Freq: Once | INTRAMUSCULAR | Status: DC | PRN
  Filled 2016-10-21: qty 1

## 2016-10-21 MED ORDER — OXYTOCIN 40 UNITS IN LACTATED RINGERS INFUSION - SIMPLE MED
1.0000 m[IU]/min | INTRAVENOUS | Status: DC
  Administered 2016-10-21: 2 m[IU]/min via INTRAVENOUS
  Filled 2016-10-21: qty 1000

## 2016-10-21 MED ORDER — ZOLPIDEM TARTRATE 5 MG PO TABS
5.0000 mg | ORAL_TABLET | Freq: Every evening | ORAL | Status: DC | PRN
Start: 2016-10-21 — End: 2016-10-23

## 2016-10-21 MED ORDER — COCONUT OIL OIL: 1.0000 "application " | TOPICAL_OIL | Status: DC | PRN

## 2016-10-21 MED ORDER — ACETAMINOPHEN 325 MG PO TABS
650.0000 mg | ORAL_TABLET | ORAL | Status: DC | PRN
Start: 2016-10-21 — End: 2016-10-23

## 2016-10-21 MED ORDER — BENZOCAINE-MENTHOL 20-0.5 % EX AERO
1.0000 "application " | INHALATION_SPRAY | CUTANEOUS | Status: DC | PRN
  Administered 2016-10-23: 1 via TOPICAL
  Filled 2016-10-21: qty 56

## 2016-10-21 NOTE — Progress Notes (Signed)
Patient ID: Lauren Costa, female   DOB: 08-11-1996, 20 y.o.   MRN: [id number] Lauren Costa is a 21 y.o. G1P0000 at 4873w5d.  Subjective: Comfortable w/ epidural  Objective: BP 123/94   Pulse 78   Temp 98.9 F (37.4 C) (Oral)   Resp 18   Ht 5\' 4"  (1.626 m)   Wt 191 lb (86.6 kg)   LMP 01/10/2016 (Within Days)   SpO2 99%   BMI 32.79 kg/m    FHT:  FHR: 125 bpm, variability: mod,  accelerations:  15x15,  decelerations:  Mild variables UC:   Q 2-4 minutes, -mod to strong Dilation: 8 Presentation: Vertex Exam by:: RN  Labs: NA  Assessment / Plan: 273w5d week IUP Labor: transition Fetal Wellbeing:  Category I-II, overall reassuring Pain Control:  epidural Anticipated MOD:  SVD  [place] Kaheem Halleck, CNM 10/21/2016 9:06 AM

## 2016-10-21 NOTE — Progress Notes (Signed)
Patient ID: Lauren Costa, female   DOB: [birth year], 20 y.o.   MRN: [id number] Lauren Costa is a 21 y.o. G1P0000 at 578w5d.  Subjective: Comfortable w/ epidural.   Objective: BP 120/77   Pulse (!) 104   Temp 98.2 F (36.8 C) (Oral)   Resp 18   Ht 5\' 4"  (1.626 m)   Wt 191 lb (86.6 kg)   LMP 01/10/2016 (Within Days)   SpO2 99%   BMI 32.79 kg/m    FHT:  FHR: 130 bpm, variability: mod,  accelerations:  15x15,  decelerations:  Few variables.  UC:   Q 3-6 minutes, moderate Dilation: 7 Effacement (%): 90 Cervical Position: Middle Station: -1 Presentation: Vertex Exam by:: Dorathy KinsmanVirginia Sharonna Costa, CNM  AROM small amount of light MSF  Labs: NA  Assessment / Plan: 448w5d week IUP Labor: Early->active, inadequate contraction pattern Fetal Wellbeing:  Category I-II, overall reassuring Pain Control:  Epidural Anticipated MOD:  SVD Continue titrating pitocin to achieve adequate labor.   [place]ia Briarrose Costa, CNM 10/21/2016 5:05 AM

## 2016-10-21 NOTE — Anesthesia Postprocedure Evaluation (Signed)
Anesthesia Post Note  Patient: Lauren Costa  Procedure(s) Performed: * No procedures listed *  Patient location during evaluation: Mother Baby Anesthesia Type: Epidural Level of consciousness: awake and alert and oriented Pain management: pain level controlled Vital Signs Assessment: post-procedure vital signs reviewed and stable Respiratory status: spontaneous breathing and nonlabored ventilation Cardiovascular status: stable Postop Assessment: no headache, patient able to bend at knees, no backache, no signs of nausea or vomiting, epidural receding and adequate PO intake Anesthetic complications: no        Last Vitals:  Vitals:   10/21/16 1046 10/21/16 1114  BP:    Pulse:    Resp: 16   Temp:  36.9 C    Last Pain:  Vitals:   10/21/16 1114  TempSrc: Oral  PainSc:    Pain Goal:                 Lauren Costa,Lauren Costa

## 2016-10-22 MED ORDER — IBUPROFEN 600 MG PO TABS: 600.0000 mg | ORAL_TABLET | Freq: Four times a day (QID) | ORAL | 2 refills | Status: DC

## 2016-10-22 MED ORDER — SENNOSIDES-DOCUSATE SODIUM 8.6-50 MG PO TABS: 2.0000 | ORAL_TABLET | Freq: Every day | ORAL | 2 refills | Status: DC

## 2016-10-22 MED ORDER — OXYCODONE-ACETAMINOPHEN 5-325 MG PO TABS: 1.0000 | ORAL_TABLET | ORAL | 0 refills | Status: DC | PRN

## 2016-10-22 NOTE — Discharge Summary (Signed)
OB Discharge Summary     Patient Name: Lauren Costa DOB: 09/06/1995 MRN: [id number]  Date of admission: 10/20/2016 Delivering MD: Genice Rouge   Date of discharge: 10/22/2016  Admitting diagnosis: 40WKS,CTX,SPOTTING Intrauterine pregnancy: [redacted]w[redacted]d     Secondary diagnosis:  Active Problems:   Labor and delivery, indication for care  Additional problems: THC use during pregnancy     Discharge diagnosis: Term Pregnancy Delivered                                                                                                Post partum procedures:none  Augmentation: Pitocin  Complications: None  Hospital course:  Onset of Labor With Vaginal Delivery     21 y.o. yo G1P1001 at [redacted]w[redacted]d was admitted in Latent Labor on 10/20/2016. Patient had an uncomplicated labor course as follows:  Membrane Rupture Time/Date: 4:33 AM ,10/21/2016   Intrapartum Procedures: Episiotomy: None [1]                                         Lacerations:  Labial [10];Vaginal [6]  Patient had a delivery of a Viable infant. 10/21/2016  Information for the patient's newborn:  Mercedies, Ganesh [[id number]]  Delivery Method: Vaginal, Spontaneous Delivery (Filed from Delivery Summary)    Pateint had an uncomplicated postpartum course.  She is ambulating, tolerating a regular diet, passing flatus, and urinating well. Patient is discharged home in stable condition on 10/22/16.   Physical exam  Vitals:   10/21/16 1240 10/21/16 1650 10/22/16 0100 10/22/16 0500  BP: (!) 107/53 108/72 137/85 124/68  Pulse: 75 76 88 76  Resp: 18 18 18 18   Temp: 98.4 F (36.9 C) 98.2 F (36.8 C) 98.9 F (37.2 C) 98.4 F (36.9 C)  TempSrc: Oral Oral Oral Oral  SpO2:      Weight:      Height:       General: alert, cooperative and no distress Lochia: appropriate Uterine Fundus: firm Incision: Lacerations: Labial;Vaginal Suture Repair: 3.0 vicryl and 4.0 monocryl DVT Evaluation: No evidence of DVT seen on physical exam. No  cords or calf tenderness. No significant calf/ankle edema. Labs: Lab Results  Component Value Date   WBC 8.3 10/20/2016   HGB 11.4 (L) 10/20/2016   HCT 34.2 (L) 10/20/2016   MCV 84.4 10/20/2016   PLT 199 10/20/2016   CMP Latest Ref Rng & Units 10/04/2016  Glucose 65 - 99 mg/dL 81  BUN 6 - 20 mg/dL 10  Creatinine 7.82 - 9.56 mg/dL 2.13  Sodium 086 - 578 mmol/L 135  Potassium 3.5 - 5.1 mmol/L 3.4(L)  Chloride 101 - 111 mmol/L 105  CO2 22 - 32 mmol/L 21(L)  Calcium 8.9 - 10.3 mg/dL 4.6(N)  Total Protein 6.5 - 8.1 g/dL 7.2  Total Bilirubin 0.3 - 1.2 mg/dL 0.5  Alkaline Phos 38 - 126 U/L 116  AST 15 - 41 U/L 24  ALT 14 - 54 U/L 20    Discharge instruction: per After Visit Summary  and "Baby and Me Booklet".  After visit meds:  Allergies as of 10/22/2016      Reactions   Peanut-containing Drug Products Hives      Medication List    TAKE these medications   ALEVE 220 MG tablet Generic drug:  naproxen sodium Take 220 mg by mouth once.   ibuprofen 600 MG tablet Commonly known as:  ADVIL,MOTRIN Take 1 tablet (600 mg total) by mouth every 6 (six) hours.   oxyCODONE-acetaminophen 5-325 MG tablet Commonly known as:  PERCOCET/ROXICET Take 1 tablet by mouth every 4 (four) hours as needed (for pain scale greater than or equal to 4 and less than 7).   pantoprazole 20 MG tablet Commonly known as:  PROTONIX Take 1 tablet (20 mg total) by mouth daily.   prenatal multivitamin Tabs tablet Take 1 tablet by mouth daily.   senna-docusate 8.6-50 MG tablet Commonly known as:  Senokot-S Take 2 tablets by mouth at bedtime.       Diet: routine diet  Activity: Advance as tolerated. Pelvic rest for 6 weeks.   Outpatient follow up:4 weels Follow up Appt: Future Appointments Date Time Provider Department Center  10/23/2016 7:30 AM [facility] ROOM [facility] None   Follow up Visit:No Follow-up on file.  Postpartum contraception: Depo Provera and IUD Mirena  Newborn Data: Live  born female  Birth Weight: 7 lb 7.6 oz (3391 g) APGAR: 8, 9  Baby Feeding: Bottle and Breast Disposition:home with mother   10/22/2016 Roe Coombsachelle A Jiovanny Burdell, CNM

## 2016-10-22 NOTE — Lactation Note (Signed)
This note was copied from a baby's chart. Lactation Consultation Note: Lactation Brochure given to mother with basic breastfeeding teaching done. Advised mother to breastfeed 8-12 times in 24 hours. Discussed cluster feeding and cue base feeding. Mother advised to do good breast compression when breastfeeding. Mother was given a hand pump with a #27 flange. Mother easily pumped 25 ml . Mother has been bottle feeding formula and breastfeeding. Suggested to mother to avoid formula and use her own pumped breastmilk. Mother advised to do good breast massage and ice breast if infant doesn't softened breast when breastfeeding. Mother is active with WIC . She was informed to call and schedule an appt. [facility] . Informed mother of [facility], and phone line services.   Patient Name: Lauren Margie Billetiasia Rawe RUEAV'WToday's Date: 10/22/2016 Reason for consult: Initial assessment   Maternal Data    Feeding Feeding Type: Breast Fed Length of feed: 15 min  LATCH Score/Interventions                      Lactation Tools Discussed/Used     Consult Status      Lauren Costa, Lauren Costa 10/22/2016, 10:12 AM

## 2016-10-22 NOTE — Discharge Instructions (Signed)

## 2016-10-22 NOTE — Progress Notes (Signed)
Post Partum Day #1 Subjective: no complaints, up ad lib, voiding and tolerating PO  Objective: Blood pressure 124/68, pulse 76, temperature 98.4 F (36.9 C), temperature source Oral, resp. rate 18, height 5\' 4"  (1.626 m), weight 191 lb (86.6 kg), last menstrual period 01/10/2016, SpO2 99 %, unknown if currently breastfeeding.  Physical Exam:  General: alert, cooperative and no distress Lochia: appropriate Uterine Fundus: firm Incision: labial; healing DVT Evaluation: No evidence of DVT seen on physical exam. No cords or calf tenderness. No significant calf/ankle edema.   Recent Labs  10/20/16 1625  HGB 11.4*  HCT 34.2*    Assessment/Plan: Discharge home, Breastfeeding and Contraception Depo or IUD   LOS: 2 days   Roe CoombsRachelle A Marquise Lambson, CNM 10/22/2016, 7:23 AM

## 2016-10-23 ENCOUNTER — Inpatient Hospital Stay (HOSPITAL_COMMUNITY): Admission: RE | Admit: 2016-10-23 | Payer: Medicaid Other | Source: Ambulatory Visit

## 2016-10-23 LAB — RPR: RPR Ser Ql: NONREACTIVE

## 2016-10-23 NOTE — Clinical Social Work Maternal (Signed)
CLINICAL SOCIAL WORK MATERNAL/CHILD NOTE  Patient Details  Name: Lauren Costa MRN: [id number] Date of Birth: 1996/06/03  Date:  10/23/2016  Clinical Social Worker Initiating Note:  Terri Piedra, [place] Date/ Time Initiated:  10/23/16/0930     Child's Name:  Lauren Costa   Legal Guardian:  Other (Comment) (Parents: Lauren Costa and Lauren Costa)   Need for Interpreter:  None   Date of Referral:  10/22/16     Reason for Referral:  Current Substance Use/Substance Use During Pregnancy    Referral Source:  [facility]   Address:  [address] [place], [place], [place] [address] (FOB lives at [address] in [place]).  Phone number:  [phone]   Household Members:  Parents (MOB reports that she lives with her mother who is a good support.  FOB lives with his mother.  He states she is also supportive.)   Natural Supports (not living in the home):  Spouse/significant other, Immediate Family, Extended Family, Friends   Professional Supports: None   Employment:     Type of Work:     Education:      Pensions consultant:  [employer]   Other Resources:      Cultural/Religious Considerations Which May Impact Care: None stated.  MOB's facesheet notes religion as Psychologist, forensic.  Strengths:  Ability to meet basic needs , Pediatrician chosen , Home prepared for child  (MOB wants to take baby to [facility] but states they are not accepting new Medicaid patients.  She plans to take baby to [facility] initially and switch to [facility] when able.)   Risk Factors/Current Problems:  Substance Use  (hx marijuana)   Cognitive State:  Alert , Able to Concentrate , Goal Oriented , Linear Thinking    Mood/Affect:  Calm , Comfortable , Happy    CSW Assessment: CSW met with MOB and FOB in MOB's first floor room/112 to offer support and complete assessment due to hx marijuana use.  MOB was positive on 03/19/16 and 07/11/16.  Baby's UDS is negative.   Parents were pleasant and welcoming of  CSW's visit.  MOB was holding baby and feeding her a bottle when CSW arrived.  She reports feeling happy about baby.  Bonding is evident in the way she held, looked at and spoke about her baby.  She states she lives with her mother and they have everything they need for baby at home.  CSW provided education regarding SIDS precautions.  Parents stated they were not aware of SIDS and were attentive to the information given.  MOB commits to keeping her baby safe.   CSW inquired about hx of marijuana use.  MOB reports she smoked because "I couldn't eat.  I was losing weight."  She reports that her last use was at approximately 3 months pregnant, when she began feeling better.  CSW explained hospital drug screen policy and mandated reporting.  CSW also explained referral that will be made to [facility].  Parents stated understanding and no questions or concerns. CSW provided information regarding PMADs and stressed the importance of reporting any symptoms or concerns to a medical professional.  MOB reports no concern that she will experience this, but was receptive to information and resources given by CSW.   CSW will monitor CDS and make CPS report accordingly.  CSW Plan/Description:  Information/Referral to [employer] , Child Protective Service Report , No Further Intervention Required/No Barriers to Discharge, Patient/Family Education     [name], [place] 10/23/2016, 4:15 PM

## 2016-10-23 NOTE — Lactation Note (Signed)
This note was copied from a baby's chart. Lactation Consultation Note: Mother has been bottle feeding formula. She states that her breast are getting sore. She states that she has an electric pump at home and plans to pump and bottle feed. Reviewed treatment plan to prevent engorgement. Mother advised to do good breast massage and ice breast for 15 mins every 3-4 hours. Mother encouraged to follow up with LC services, OP, BFS[facility]. [facility] mother today [facility] .   Patient Name: Lauren Costa ZOXWR'UToday's Date: 10/23/2016 Reason for consult: Follow-up assessment   Maternal Data    Feeding    LATCH Score/Interventions                      Lactation Tools Discussed/Used     Consult Status Consult Status: Complete    Michel BickersKendrick, Sueanne Maniaci McCoy 10/23/2016, 10:05 AM

## 2016-10-23 NOTE — Progress Notes (Signed)
Post Partum Day #2 Subjective: no complaints, up ad lib, voiding, tolerating PO and + flatus  Objective: Blood pressure 120/72, pulse 76, temperature 98.4 F (36.9 C), temperature source Oral, resp. rate 18, height 5\' 4"  (1.626 m), weight 191 lb (86.6 kg), last menstrual period 01/10/2016, SpO2 99 %, unknown if currently breastfeeding.  Physical Exam:  General: alert, cooperative and no distress Lochia: appropriate Uterine Fundus: firm Incision: labial, vaginal lac: healing DVT Evaluation: No evidence of DVT seen on physical exam. No cords or calf tenderness. No significant calf/ankle edema.   Recent Labs  10/20/16 1625  HGB 11.4*  HCT 34.2*    Assessment/Plan: Discharge home, Breastfeeding and Contraception IUD vs Depo injections   LOS: 3 days   Roe CoombsRachelle A Denney, CNM 10/23/2016, 7:31 AM

## 2017-02-10 ENCOUNTER — Other Ambulatory Visit: Payer: Self-pay | Admitting: Obstetrics and Gynecology

## 2017-03-03 ENCOUNTER — Inpatient Hospital Stay (HOSPITAL_COMMUNITY)
Admission: AD | Admit: 2017-03-03 | Discharge: 2017-03-03 | Discharge status: Against Medical Advice | Payer: Medicaid Other | Source: Ambulatory Visit | Attending: Family Medicine | Admitting: Family Medicine

## 2017-03-03 NOTE — MAU Note (Signed)
Not in lobby #3 

## 2017-03-03 NOTE — MAU Note (Signed)
Not  In lobby, people in lobby said "she gone, she left"

## 2017-03-03 NOTE — MAU Note (Signed)
#  2 not in lobby, again told she left

## 2017-06-21 ENCOUNTER — Emergency Department (HOSPITAL_COMMUNITY): Payer: Medicaid Other

## 2017-06-21 ENCOUNTER — Emergency Department (HOSPITAL_COMMUNITY)
Admission: EM | Admit: 2017-06-21 | Discharge: 2017-06-22 | Disposition: A | Discharge status: Home / Self Care | Payer: Medicaid Other | Attending: Emergency Medicine | Admitting: Emergency Medicine

## 2017-06-21 ENCOUNTER — Encounter (HOSPITAL_COMMUNITY): Payer: Self-pay

## 2017-06-21 DIAGNOSIS — Z91010 Allergy to peanuts: Secondary | ICD-10-CM | POA: Insufficient documentation

## 2017-06-21 DIAGNOSIS — J029 Acute pharyngitis, unspecified: Secondary | ICD-10-CM | POA: Diagnosis present

## 2017-06-21 DIAGNOSIS — Z87891 Personal history of nicotine dependence: Secondary | ICD-10-CM | POA: Insufficient documentation

## 2017-06-21 DIAGNOSIS — Z79899 Other long term (current) drug therapy: Secondary | ICD-10-CM | POA: Diagnosis not present

## 2017-06-21 DIAGNOSIS — J020 Streptococcal pharyngitis: Secondary | ICD-10-CM | POA: Diagnosis not present

## 2017-06-21 LAB — I-STAT CHEM 8, ED
BUN: 9 mg/dL (ref 6–20)
Calcium, Ion: 1.15 mmol/L (ref 1.15–1.40)
Chloride: 103 mmol/L (ref 101–111)
Creatinine, Ser: 0.6 mg/dL (ref 0.44–1.00)
Glucose, Bld: 112 mg/dL — ABNORMAL HIGH (ref 65–99)
HEMATOCRIT: 38 % (ref 36.0–46.0)
HEMOGLOBIN: 12.9 g/dL (ref 12.0–15.0)
Potassium: 3.6 mmol/L (ref 3.5–5.1)
SODIUM: 137 mmol/L (ref 135–145)
TCO2: 24 mmol/L (ref 22–32)

## 2017-06-21 LAB — RAPID STREP SCREEN (MED CTR MEBANE ONLY): Streptococcus, Group A Screen (Direct): POSITIVE — AB

## 2017-06-21 MED ORDER — ONDANSETRON HCL 4 MG/2ML IJ SOLN
4.0000 mg | Freq: Once | INTRAMUSCULAR | Status: AC
  Administered 2017-06-21: 4 mg via INTRAVENOUS
  Filled 2017-06-21: qty 2

## 2017-06-21 MED ORDER — IOPAMIDOL (ISOVUE-300) INJECTION 61%
INTRAVENOUS | Status: AC
  Administered 2017-06-21: 75 mL
  Filled 2017-06-21: qty 75

## 2017-06-21 MED ORDER — OXYCODONE-ACETAMINOPHEN 5-325 MG PO TABS
2.0000 | ORAL_TABLET | Freq: Once | ORAL | Status: AC
  Administered 2017-06-21: 2 via ORAL
  Filled 2017-06-21: qty 2

## 2017-06-21 MED ORDER — SODIUM CHLORIDE 0.9 % IV BOLUS (SEPSIS)
1000.0000 mL | Freq: Once | INTRAVENOUS | Status: AC
  Administered 2017-06-21: 1000 mL via INTRAVENOUS

## 2017-06-21 MED ORDER — IBUPROFEN 800 MG PO TABS
800.0000 mg | ORAL_TABLET | Freq: Once | ORAL | Status: AC
  Administered 2017-06-21: 800 mg via ORAL
  Filled 2017-06-21: qty 1

## 2017-06-21 NOTE — ED Triage Notes (Signed)
Pt states that she woke up today with fevers, chills, and a sore throat, R ear pain, vomiting x 1 and body aches.

## 2017-06-21 NOTE — ED Notes (Signed)
Pt to CT at this time.

## 2017-06-21 NOTE — ED Provider Notes (Signed)
[facility]ENC[facility] Provider Note   CSN: [id number] Arrival date & time: 06/21/17  2217     History   Chief Complaint Chief Complaint  Patient presents with  . Influenza    HPI Lauren Costa is a 21 y.o. female with a hx of marijuana abuse presents to the Emergency Department complaining of gradual, persistent, progressively worsening sore throat, right greater than left onset this morning.  She reports associated fevers, chills, right otalgia and one episode of nonbloody nonbilious emesis.  She also reports generalized body aches.  She reports that her child is sick at home with a fever but has not been diagnosed with a specific infections.  She reports that she is able to swallow but it is significantly painful.  She denies neck pain, headache, neck stiffness, chest pain, shortness of breath, abdominal pain, weakness, dizziness, syncope.  The history is provided by the patient and medical records. No language interpreter was used.    History reviewed. No pertinent past medical history.  Patient Active Problem List   Diagnosis Date Noted  . Labor and delivery, indication for care 10/21/2016  . Mild tetrahydrocannabinol (THC) abuse 07/17/2016  . Supervision of normal first pregnancy 03/19/2016  . Substance abuse affecting pregnancy, antepartum 03/19/2016  . Bilateral breast lump 03/19/2016    Past Surgical History:  Procedure Laterality Date  . DENTAL SURGERY      OB History    Gravida Para Term Preterm AB Living   1 1 1    0 1   SAB TAB Ectopic Multiple Live Births   0     0 1       Home Medications    Prior to Admission medications   Medication Sig Start Date End Date Taking? Authorizing Provider  clindamycin (CLEOCIN) 150 MG capsule Take 3 capsules (450 mg total) 3 (three) times daily by mouth. 06/22/17   Marialy Urbanczyk, Dahlia ClientHannah, PA-C  ibuprofen (ADVIL,MOTRIN) 600 MG tablet Take 1 tablet (600 mg total) by mouth every 6 (six) hours.  10/22/16   Denney, Rachelle A, CNM  naproxen sodium (ALEVE) 220 MG tablet Take 220 mg by mouth once.    [provider]  oxyCODONE-acetaminophen (PERCOCET/ROXICET) 5-325 MG tablet Take 1 tablet by mouth every 4 (four) hours as needed (for pain scale greater than or equal to 4 and less than 7). 10/22/16   Orvilla Cornwallenney, Rachelle A, CNM  pantoprazole (PROTONIX) 20 MG tablet Take 1 tablet (20 mg total) by mouth daily. Patient not taking: Reported on 10/04/2016 09/09/16 09/09/17  Adam PhenixArnold, James G, MD  Prenatal Vit-Fe Fumarate-FA (PRENATAL MULTIVITAMIN) TABS tablet Take 1 tablet by mouth daily.     [provider]  senna-docusate (SENOKOT-S) 8.6-50 MG tablet Take 2 tablets by mouth at bedtime. 10/22/16   Roe Coombsenney, Rachelle A, CNM    Family History Family History  Problem Relation Age of Onset  . Cancer Maternal Aunt   . Cancer Maternal Uncle   . Alcohol abuse Neg Hx   . Arthritis Neg Hx   . Asthma Neg Hx   . Birth defects Neg Hx   . COPD Neg Hx   . Depression Neg Hx   . Diabetes Neg Hx   . Drug abuse Neg Hx   . Early death Neg Hx   . Hearing loss Neg Hx   . Heart disease Neg Hx   . Hyperlipidemia Neg Hx   . Hypertension Neg Hx   . Kidney disease Neg Hx   .  Learning disabilities Neg Hx   . Mental illness Neg Hx   . Mental retardation Neg Hx   . Miscarriages / Stillbirths Neg Hx   . Stroke Neg Hx   . Vision loss Neg Hx   . Varicose Veins Neg Hx     Social History Social History   Tobacco Use  . Smoking status: Former Games developermoker  . Smokeless tobacco: Former Engineer, waterUser  Substance Use Topics  . Alcohol use: No  . Drug use: No     Allergies   Peanut-containing drug products   Review of Systems Review of Systems  Constitutional: Positive for chills and fever. Negative for appetite change, diaphoresis, fatigue and unexpected weight change.  HENT: Positive for ear pain and sore throat. Negative for mouth sores.   Eyes: Negative for visual disturbance.  Respiratory: Negative for  cough, chest tightness, shortness of breath and wheezing.   Cardiovascular: Negative for chest pain.  Gastrointestinal: Positive for vomiting (x1). Negative for abdominal pain, constipation, diarrhea and nausea.  Endocrine: Negative for polydipsia, polyphagia and polyuria.  Genitourinary: Negative for dysuria, frequency, hematuria and urgency.  Musculoskeletal: Positive for myalgias. Negative for back pain and neck stiffness.  Skin: Negative for rash.  Allergic/Immunologic: Negative for immunocompromised state.  Neurological: Negative for syncope, light-headedness and headaches.  Hematological: Does not bruise/bleed easily.  Psychiatric/Behavioral: Negative for sleep disturbance. The patient is not nervous/anxious.      Physical Exam Updated Vital Signs BP (!) 145/67   Pulse (!) 102   Temp (!) 101.1 F (38.4 C)   Resp 18   SpO2 100%   Physical Exam  Constitutional: She appears well-developed and well-nourished. No distress.  HENT:  Head: Normocephalic and atraumatic.  Right Ear: Tympanic membrane, external ear and ear canal normal.  Left Ear: Tympanic membrane, external ear and ear canal normal.  Nose: Nose normal. No mucosal edema or rhinorrhea.  Mouth/Throat: Mucous membranes are normal. Mucous membranes are not dry. No trismus in the jaw. No uvula swelling. Oropharyngeal exudate, posterior oropharyngeal edema and posterior oropharyngeal erythema present. No tonsillar abscesses.  Posterior oropharynx with erythema, edema and scant exudate on the tonsils R tonsil is significantly larger than the left Uvula deviated to the right, but without swelling  Eyes: Conjunctivae are normal.  Neck: Normal range of motion, full passive range of motion without pain and phonation normal. No tracheal tenderness, no spinous process tenderness and no muscular tenderness present. No neck rigidity. No erythema and normal range of motion present. No Brudzinski's sign and no Kernig's sign noted.    Range of motion without pain  No midline or paraspinal tenderness Normal phonation No stridor Handling secretions without difficulty No nuchal rigidity or meningeal signs  Cardiovascular: Regular rhythm and normal heart sounds. Tachycardia present.  Pulses:      Radial pulses are 2+ on the right side, and 2+ on the left side.  Pulmonary/Chest: Effort normal and breath sounds normal. No stridor. No respiratory distress. She has no decreased breath sounds. She has no wheezes.  Equal chest expansion, clear and equal breath sounds without focal wheezes, rhonchi or rales  Musculoskeletal: Normal range of motion.  Lymphadenopathy:       Head (right side): Submandibular and tonsillar adenopathy present. No submental, no preauricular, no posterior auricular and no occipital adenopathy present.       Head (left side): Submandibular and tonsillar adenopathy present. No submental, no preauricular, no posterior auricular and no occipital adenopathy present.    She has cervical adenopathy (moderate).  Right cervical: Superficial cervical adenopathy present. No deep cervical and no posterior cervical adenopathy present.      Left cervical: Superficial cervical adenopathy present. No deep cervical and no posterior cervical adenopathy present.  Neurological: She is alert.  Alert and oriented Moves all extremities without ataxia  Skin: Skin is warm and dry. She is not diaphoretic.  Psychiatric: She has a normal mood and affect.  Nursing note and vitals reviewed.    ED Treatments / Results  Labs (all labs ordered are listed, but only abnormal results are displayed) Labs Reviewed  RAPID STREP SCREEN (NOT AT [facility]) - Abnormal; Notable for the following components:      Result Value   Streptococcus, Group A Screen (Direct) POSITIVE (*)    All other components within normal limits  I-STAT CHEM 8, ED - Abnormal; Notable for the following components:   Glucose, Bld 112 (*)    All other components  within normal limits    Radiology Ct Soft Tissue Neck W Contrast  Result Date: 06/22/2017 CLINICAL DATA:  Fever, chills and sore throat today. RIGHT ear pain and vomiting, body aches. EXAM: CT NECK WITH CONTRAST TECHNIQUE: Multidetector CT imaging of the neck was performed using the standard protocol following the bolus administration of intravenous contrast. CONTRAST:  75mL ISOVUE-300 IOPAMIDOL (ISOVUE-300) INJECTION 61% COMPARISON:  Cervical spine radiograph July 26, 2014 FINDINGS: PHARYNX AND LARYNX: Enlarged bilateral palatine tonsils with striated enhancement. Preservation of parapharyngeal fat planes. No focal fluid collection. Normal epiglottis. Normal larynx. SALIVARY GLANDS: Normal. THYROID: Normal. LYMPH NODES: Borderline cervical reactive lymphadenopathy. VASCULAR: Normal. LIMITED INTRACRANIAL: Normal. VISUALIZED ORBITS: Normal. MASTOIDS AND VISUALIZED PARANASAL SINUSES: Small LEFT sphenoid sinus air-fluid level. SKELETON: Nonacute. UPPER CHEST: Lung apices are clear. No superior mediastinal lymphadenopathy. Residual thymic tissue. OTHER: LEFT periorbital soft tissue piercing. IMPRESSION: 1. Acute tonsillitis without peritonsillar abscess.  Patent airway. 2. Borderline reactive lymphadenopathy. Electronically Signed   By: Awilda Metro M.D.   On: 06/22/2017 00:24    Procedures Procedures (including critical care time)  Medications Ordered in ED Medications  dexamethasone (DECADRON) injection 10 mg (not administered)  clindamycin (CLEOCIN) capsule 300 mg (not administered)  ibuprofen (ADVIL,MOTRIN) tablet 800 mg (800 mg Oral Given 06/21/17 2341)  sodium chloride 0.9 % bolus 1,000 mL (1,000 mLs Intravenous New Bag/Given 06/21/17 2332)  ondansetron (ZOFRAN) injection 4 mg (4 mg Intravenous Given 06/21/17 2340)  oxyCODONE-acetaminophen (PERCOCET/ROXICET) 5-325 MG per tablet 2 tablet (2 tablets Oral Given 06/21/17 2341)  iopamidol (ISOVUE-300) 61 % injection (75 mLs  Contrast Given  06/21/17 2354)     Initial Impression / Assessment and Plan / ED Course  I have reviewed the triage vital signs and the nursing notes.  Pertinent labs & imaging results that were available during my care of the patient were reviewed by me and considered in my medical decision making (see chart for details).     Patient with fever and sore throat.  Patient with positive strep test and significant swelling of the right tonsil and peritonsillar area.  Concern for possible PTA.  CT scan with evidence of tonsillitis without peritonsillar abscess.  Patient given clindamycin and fluids here in the emergency department.  Fever improved, tachycardia resolved.  Discussed symptomatic treatment at home and reasons to return immediately to the emergency department.  Patient states understanding and is in agreement with the plan.   Vitals:   06/21/17 2222 06/22/17 0055  BP: (!) 145/67 (!) 109/52  Pulse: (!) 102 74  Resp: 18 16  Temp: (!) 101.1 F (38.4 C) (!) 100.7 F (38.2 C)  TempSrc:  Oral  SpO2: 100% 100%     Final Clinical Impressions(s) / ED Diagnoses   Final diagnoses:  Strep pharyngitis    ED Discharge Orders        Ordered    clindamycin (CLEOCIN) 150 MG capsule  3 times daily     06/22/17 0046       Johnchristopher Sarvis, Dahlia Client, PA-C 06/22/17 0145    Linwood Dibbles, MD 06/22/17 1930

## 2017-06-22 MED ORDER — DEXAMETHASONE SODIUM PHOSPHATE 10 MG/ML IJ SOLN
10.0000 mg | Freq: Once | INTRAMUSCULAR | Status: AC
  Administered 2017-06-22: 10 mg via INTRAVENOUS
  Filled 2017-06-22: qty 1

## 2017-06-22 MED ORDER — CLINDAMYCIN HCL 150 MG PO CAPS: 450.0000 mg | ORAL_CAPSULE | Freq: Three times a day (TID) | ORAL | 0 refills | Status: DC

## 2017-06-22 MED ORDER — CLINDAMYCIN HCL 150 MG PO CAPS
300.0000 mg | ORAL_CAPSULE | Freq: Once | ORAL | Status: AC
  Administered 2017-06-22: 300 mg via ORAL
  Filled 2017-06-22: qty 2

## 2017-06-22 NOTE — Discharge Instructions (Signed)
1. Medications: Clindamycin, usual home medications 2. Treatment: rest, drink plenty of fluids,  3. Follow Up: Please followup with your primary doctor in 2-3 days for discussion of your diagnoses and further evaluation after today's visit; if you do not have a primary care doctor use the resource guide provided to find one; Please return to the ER for difficulty talking, swallowing, worsening fevers or other concerns

## 2018-03-25 ENCOUNTER — Encounter (HOSPITAL_COMMUNITY): Payer: Self-pay | Admitting: *Deleted

## 2018-03-25 ENCOUNTER — Inpatient Hospital Stay (HOSPITAL_COMMUNITY)
Admission: AD | Admit: 2018-03-25 | Discharge: 2018-03-26 | Disposition: A | Discharge status: Home / Self Care | Payer: Medicaid Other | Source: Ambulatory Visit | Attending: Obstetrics & Gynecology | Admitting: Obstetrics & Gynecology

## 2018-03-25 DIAGNOSIS — O283 Abnormal ultrasonic finding on antenatal screening of mother: Secondary | ICD-10-CM | POA: Diagnosis not present

## 2018-03-25 DIAGNOSIS — O26891 Other specified pregnancy related conditions, first trimester: Secondary | ICD-10-CM | POA: Insufficient documentation

## 2018-03-25 DIAGNOSIS — Z87891 Personal history of nicotine dependence: Secondary | ICD-10-CM | POA: Insufficient documentation

## 2018-03-25 DIAGNOSIS — Z3A01 Less than 8 weeks gestation of pregnancy: Secondary | ICD-10-CM | POA: Diagnosis not present

## 2018-03-25 DIAGNOSIS — R109 Unspecified abdominal pain: Secondary | ICD-10-CM | POA: Diagnosis present

## 2018-03-25 DIAGNOSIS — O3680X0 Pregnancy with inconclusive fetal viability, not applicable or unspecified: Secondary | ICD-10-CM

## 2018-03-25 DIAGNOSIS — O26899 Other specified pregnancy related conditions, unspecified trimester: Secondary | ICD-10-CM

## 2018-03-25 NOTE — MAU Note (Signed)
SAYS  WENT TO HD LAST WEEK-  COLLECTED CX AND SWABS-  AND  GAVE RX- FOR BV- STILL TAKING.

## 2018-03-25 NOTE — MAU Note (Signed)
PT SAYS  SHE STARTED CRAMPING ON 8-12.  HPT - TODAY - POSITIVE  LAST SEX-  1 WEEK AGO.  NO BIRTH CONTROL.

## 2018-03-26 ENCOUNTER — Inpatient Hospital Stay (HOSPITAL_COMMUNITY): Payer: Medicaid Other

## 2018-03-26 DIAGNOSIS — O26891 Other specified pregnancy related conditions, first trimester: Secondary | ICD-10-CM

## 2018-03-26 DIAGNOSIS — R109 Unspecified abdominal pain: Secondary | ICD-10-CM

## 2018-03-26 DIAGNOSIS — O283 Abnormal ultrasonic finding on antenatal screening of mother: Secondary | ICD-10-CM

## 2018-03-26 DIAGNOSIS — Z3A01 Less than 8 weeks gestation of pregnancy: Secondary | ICD-10-CM

## 2018-03-26 LAB — URINALYSIS, ROUTINE W REFLEX MICROSCOPIC
BILIRUBIN URINE: NEGATIVE
GLUCOSE, UA: NEGATIVE mg/dL
HGB URINE DIPSTICK: NEGATIVE
Ketones, ur: 5 mg/dL — AB
Leukocytes, UA: NEGATIVE
Nitrite: NEGATIVE
Protein, ur: NEGATIVE mg/dL
SPECIFIC GRAVITY, URINE: 1.034 — AB (ref 1.005–1.030)
pH: 5 (ref 5.0–8.0)

## 2018-03-26 LAB — POCT PREGNANCY, URINE: PREG TEST UR: POSITIVE — AB

## 2018-03-26 LAB — CBC
HCT: 35.2 % — ABNORMAL LOW (ref 36.0–46.0)
Hemoglobin: 11.7 g/dL — ABNORMAL LOW (ref 12.0–15.0)
MCH: 28.1 pg (ref 26.0–34.0)
MCHC: 33.2 g/dL (ref 30.0–36.0)
MCV: 84.4 fL (ref 78.0–100.0)
Platelets: 232 10*3/uL (ref 150–400)
RBC: 4.17 MIL/uL (ref 3.87–5.11)
RDW: 14.2 % (ref 11.5–15.5)
WBC: 8.1 10*3/uL (ref 4.0–10.5)

## 2018-03-26 LAB — HCG, QUANTITATIVE, PREGNANCY: hCG, Beta Chain, Quant, S: 246 m[IU]/mL — ABNORMAL HIGH (ref <5)

## 2018-03-26 NOTE — Discharge Instructions (Signed)

## 2018-03-26 NOTE — MAU Provider Note (Signed)
History     CSN: [id number]  Arrival date and time: 03/25/18 2318   Chief Complaint  Patient presents with  . Abdominal Pain   HPI Lauren Costa is a 22 y.o. G2P1001 at [redacted]w[redacted]d who presents with cramping. She states she noticed the cramping today and rates it a 5/10. She has not tried anything for the pain. She denies any vaginal bleeding. She states she [facility] last week and had a pelvic exam done with cultures. She reports an LMP of 02/17/18.  OB History    Gravida  2   Para  1   Term  1   Preterm      AB  0   Living  1     SAB  0   TAB      Ectopic      Multiple  0   Live Births  1           History reviewed. No pertinent past medical history.  Past Surgical History:  Procedure Laterality Date  . DENTAL SURGERY      Family History  Problem Relation Age of Onset  . Cancer Maternal Aunt   . Cancer Maternal Uncle   . Alcohol abuse Neg Hx   . Arthritis Neg Hx   . Asthma Neg Hx   . Birth defects Neg Hx   . COPD Neg Hx   . Depression Neg Hx   . Diabetes Neg Hx   . Drug abuse Neg Hx   . Early death Neg Hx   . Hearing loss Neg Hx   . Heart disease Neg Hx   . Hyperlipidemia Neg Hx   . Hypertension Neg Hx   . Kidney disease Neg Hx   . Learning disabilities Neg Hx   . Mental illness Neg Hx   . Mental retardation Neg Hx   . Miscarriages / Stillbirths Neg Hx   . Stroke Neg Hx   . Vision loss Neg Hx   . Varicose Veins Neg Hx     Social History   Tobacco Use  . Smoking status: Former Games developer  . Smokeless tobacco: Former Engineer, water Use Topics  . Alcohol use: No  . Drug use: No    Allergies:  Allergies  Allergen Reactions  . Peanut-Containing Drug Products Hives    Medications Prior to Admission  Medication Sig Dispense Refill Last Dose  . clindamycin (CLEOCIN) 150 MG capsule Take 3 capsules (450 mg total) 3 (three) times daily by mouth. 90 capsule 0 More than a month at Unknown time  . ibuprofen  (ADVIL,MOTRIN) 600 MG tablet Take 1 tablet (600 mg total) by mouth every 6 (six) hours. 120 tablet 2   . naproxen sodium (ALEVE) 220 MG tablet Take 220 mg by mouth once.   Past Week at Unknown time  . oxyCODONE-acetaminophen (PERCOCET/ROXICET) 5-325 MG tablet Take 1 tablet by mouth every 4 (four) hours as needed (for pain scale greater than or equal to 4 and less than 7). 30 tablet 0   . pantoprazole (PROTONIX) 20 MG tablet Take 1 tablet (20 mg total) by mouth daily. (Patient not taking: Reported on 10/04/2016) 30 tablet 1 Not Taking  . Prenatal Vit-Fe Fumarate-FA (PRENATAL MULTIVITAMIN) TABS tablet Take 1 tablet by mouth daily.    Past Week at Unknown time  . senna-docusate (SENOKOT-S) 8.6-50 MG tablet Take 2 tablets by mouth at bedtime. 60 tablet 2     Review of Systems  Constitutional: Negative.  Negative for fatigue and fever.  HENT: Negative.   Respiratory: Negative.  Negative for shortness of breath.   Cardiovascular: Negative.  Negative for chest pain.  Gastrointestinal: Positive for abdominal pain. Negative for constipation, diarrhea, nausea and vomiting.  Genitourinary: Negative.  Negative for dysuria, vaginal bleeding and vaginal discharge.  Neurological: Negative.  Negative for dizziness and headaches.   Physical Exam   Blood pressure 131/65, pulse 95, temperature 98.5 F (36.9 C), temperature source Oral, resp. rate 20, height 5\' 5"  (1.651 m), weight 99.7 kg, last menstrual period 02/17/2018, unknown if currently breastfeeding.  Physical Exam  Nursing note and vitals reviewed. Constitutional: She is oriented to person, place, and time. She appears well-developed and well-nourished. No distress.  HENT:  Head: Normocephalic.  Eyes: Pupils are equal, round, and reactive to light.  Cardiovascular: Normal rate, regular rhythm and normal heart sounds.  Respiratory: Effort normal and breath sounds normal. No respiratory distress.  GI: Soft. Bowel sounds are normal. She exhibits no  distension. There is no tenderness. There is no rebound and no guarding.  Neurological: She is alert and oriented to person, place, and time.  Skin: Skin is warm and dry.  Psychiatric: She has a normal mood and affect. Her behavior is normal. Judgment and thought content normal.    MAU Course  Procedures Results for orders placed or performed during the hospital encounter of 03/25/18 (from the past 24 hour(s))  Pregnancy, urine POC     Status: Abnormal   Collection Time: 03/25/18 11:56 PM  Result Value Ref Range   Preg Test, Ur POSITIVE (A) NEGATIVE  Urinalysis, Routine w reflex microscopic     Status: Abnormal   Collection Time: 03/25/18 11:59 PM  Result Value Ref Range   Color, Urine YELLOW YELLOW   APPearance HAZY (A) CLEAR   Specific Gravity, Urine 1.034 (H) 1.005 - 1.030   pH 5.0 5.0 - 8.0   Glucose, UA NEGATIVE NEGATIVE mg/dL   Hgb urine dipstick NEGATIVE NEGATIVE   Bilirubin Urine NEGATIVE NEGATIVE   Ketones, ur 5 (A) NEGATIVE mg/dL   Protein, ur NEGATIVE NEGATIVE mg/dL   Nitrite NEGATIVE NEGATIVE   Leukocytes, UA NEGATIVE NEGATIVE  CBC     Status: Abnormal   Collection Time: 03/26/18 12:11 AM  Result Value Ref Range   WBC 8.1 4.0 - 10.5 K/uL   RBC 4.17 3.87 - 5.11 MIL/uL   Hemoglobin 11.7 (L) 12.0 - 15.0 g/dL   HCT 13.235.2 (L) 44.036.0 - 10.246.0 %   MCV 84.4 78.0 - 100.0 fL   MCH 28.1 26.0 - 34.0 pg   MCHC 33.2 30.0 - 36.0 g/dL   RDW 72.514.2 36.611.5 - 44.015.5 %   Platelets 232 150 - 400 K/uL  hCG, quantitative, pregnancy     Status: Abnormal   Collection Time: 03/26/18 12:11 AM  Result Value Ref Range   hCG, Beta Chain, Quant, S 246 (H) <5 mIU/mL   [place] Ob Less Than 14 Weeks With Ob Transvaginal  Result Date: 03/26/2018 CLINICAL DATA:  Pain and cramping since 03/22/2018. Estimated gestational age by LMP is 5 weeks 2 days. Pregnancy test results are not reported. EXAM: OBSTETRIC <14 WK US AND TRANSVAGINAL OB US TECHNIQUE: Both transabdominal and transvaginal ultrasound examinations  were performed for complete evaluation of the gestation as well as the maternal uterus, adnexal regions, and pelvic cul-de-sac. Transvaginal technique was performed to assess early pregnancy. COMPARISON:  None. FINDINGS: Intrauterine gestational sac: None Yolk sac:  Not Visualized. Embryo:  Not Visualized. Cardiac Activity: Not Visualized. Maternal uterus/adnexae: Uterus is retroverted. No myometrial mass lesions identified. Small amount of free fluid demonstrated in the endometrium. Endometrial stripe thickness is not expanded. Both ovaries are visualized and appear normal. Corpus luteal cyst demonstrated on the right ovary. Small amount of free fluid in the pelvis. IMPRESSION: No intrauterine gestational sac, yolk sac, or fetal pole identified. Differential considerations include intrauterine pregnancy too early to be sonographically visualized, missed abortion, or ectopic pregnancy. Followup ultrasound is recommended in 10-14 days for further evaluation. Electronically Signed   By: Burman NievesWilliam  Stevens M.D.   On: 03/26/2018 00:55   MDM UA, UPT CBC, HCG, ABO/Rh US OB Comp Less 14 weeks with Transvaginal Patient refused pelvic exam- states she had swabs done at the health department last week and does not want them done again.  Assessment and Plan   1. Pregnancy of unknown anatomic location   2. Abdominal pain affecting pregnancy   3. [redacted] weeks gestation of pregnancy    -Discharge home in stable condition -Strict ectopic precautions discussed -Patient advised to follow-up with MAU on Sunday for repeat blood work -Patient may return to MAU as needed or if her condition were to change or worsen  Rolm BookbinderCaroline M Neill CNM 03/26/2018, 1:00 AM

## 2018-04-19 ENCOUNTER — Inpatient Hospital Stay (HOSPITAL_COMMUNITY)
Admission: AD | Admit: 2018-04-19 | Discharge: 2018-04-19 | Disposition: A | Discharge status: Home / Self Care | Payer: Medicaid Other | Source: Ambulatory Visit | Attending: Obstetrics & Gynecology | Admitting: Obstetrics & Gynecology

## 2018-04-19 ENCOUNTER — Encounter (HOSPITAL_COMMUNITY): Payer: Self-pay | Admitting: *Deleted

## 2018-04-19 ENCOUNTER — Inpatient Hospital Stay (HOSPITAL_COMMUNITY): Payer: Medicaid Other

## 2018-04-19 DIAGNOSIS — F419 Anxiety disorder, unspecified: Secondary | ICD-10-CM | POA: Diagnosis not present

## 2018-04-19 DIAGNOSIS — O23591 Infection of other part of genital tract in pregnancy, first trimester: Secondary | ICD-10-CM | POA: Diagnosis not present

## 2018-04-19 DIAGNOSIS — R109 Unspecified abdominal pain: Secondary | ICD-10-CM | POA: Diagnosis present

## 2018-04-19 DIAGNOSIS — Z3A08 8 weeks gestation of pregnancy: Secondary | ICD-10-CM | POA: Diagnosis not present

## 2018-04-19 DIAGNOSIS — O99341 Other mental disorders complicating pregnancy, first trimester: Secondary | ICD-10-CM | POA: Insufficient documentation

## 2018-04-19 DIAGNOSIS — O219 Vomiting of pregnancy, unspecified: Secondary | ICD-10-CM | POA: Diagnosis present

## 2018-04-19 DIAGNOSIS — Z809 Family history of malignant neoplasm, unspecified: Secondary | ICD-10-CM | POA: Insufficient documentation

## 2018-04-19 DIAGNOSIS — Z3403 Encounter for supervision of normal first pregnancy, third trimester: Secondary | ICD-10-CM

## 2018-04-19 DIAGNOSIS — B9689 Other specified bacterial agents as the cause of diseases classified elsewhere: Secondary | ICD-10-CM | POA: Diagnosis present

## 2018-04-19 DIAGNOSIS — O26831 Pregnancy related renal disease, first trimester: Secondary | ICD-10-CM | POA: Diagnosis not present

## 2018-04-19 DIAGNOSIS — O26891 Other specified pregnancy related conditions, first trimester: Secondary | ICD-10-CM | POA: Diagnosis present

## 2018-04-19 DIAGNOSIS — Z87891 Personal history of nicotine dependence: Secondary | ICD-10-CM | POA: Insufficient documentation

## 2018-04-19 DIAGNOSIS — O210 Mild hyperemesis gravidarum: Secondary | ICD-10-CM | POA: Insufficient documentation

## 2018-04-19 DIAGNOSIS — N189 Chronic kidney disease, unspecified: Secondary | ICD-10-CM | POA: Diagnosis not present

## 2018-04-19 DIAGNOSIS — Z9889 Other specified postprocedural states: Secondary | ICD-10-CM | POA: Diagnosis not present

## 2018-04-19 DIAGNOSIS — O26899 Other specified pregnancy related conditions, unspecified trimester: Secondary | ICD-10-CM

## 2018-04-19 DIAGNOSIS — N760 Acute vaginitis: Secondary | ICD-10-CM

## 2018-04-19 DIAGNOSIS — Z79899 Other long term (current) drug therapy: Secondary | ICD-10-CM | POA: Insufficient documentation

## 2018-04-19 DIAGNOSIS — Z91010 Allergy to peanuts: Secondary | ICD-10-CM | POA: Insufficient documentation

## 2018-04-19 DIAGNOSIS — Z3490 Encounter for supervision of normal pregnancy, unspecified, unspecified trimester: Secondary | ICD-10-CM

## 2018-04-19 HISTORY — DX: Unspecified lump in the left breast, unspecified quadrant: N63.20

## 2018-04-19 HISTORY — DX: Headache: R51

## 2018-04-19 HISTORY — DX: Chronic kidney disease, unspecified: N18.9

## 2018-04-19 HISTORY — DX: Anxiety disorder, unspecified: F41.9

## 2018-04-19 HISTORY — DX: Unspecified lump in the right breast, unspecified quadrant: N63.10

## 2018-04-19 LAB — COMPREHENSIVE METABOLIC PANEL
ALBUMIN: 3.9 g/dL (ref 3.5–5.0)
ALK PHOS: 70 U/L (ref 38–126)
ALT: 12 U/L (ref 0–44)
AST: 16 U/L (ref 15–41)
Anion gap: 11 (ref 5–15)
BILIRUBIN TOTAL: 1 mg/dL (ref 0.3–1.2)
BUN: 6 mg/dL (ref 6–20)
CALCIUM: 9.1 mg/dL (ref 8.9–10.3)
CO2: 20 mmol/L — AB (ref 22–32)
CREATININE: 0.55 mg/dL (ref 0.44–1.00)
Chloride: 104 mmol/L (ref 98–111)
GFR calc Af Amer: >60 mL/min (ref >60)
GFR calc non Af Amer: >60 mL/min (ref >60)
GLUCOSE: 83 mg/dL (ref 70–99)
Potassium: 3.8 mmol/L (ref 3.5–5.1)
SODIUM: 135 mmol/L (ref 135–145)
TOTAL PROTEIN: 7.5 g/dL (ref 6.5–8.1)

## 2018-04-19 LAB — CBC WITH DIFFERENTIAL/PLATELET
BASOS ABS: 0 10*3/uL (ref 0.0–0.1)
BASOS PCT: 0 %
Eosinophils Absolute: 0 10*3/uL (ref 0.0–0.7)
Eosinophils Relative: 0 %
HEMATOCRIT: 36.4 % (ref 36.0–46.0)
HEMOGLOBIN: 12.2 g/dL (ref 12.0–15.0)
Lymphocytes Relative: 28 %
Lymphs Abs: 2.2 10*3/uL (ref 0.7–4.0)
MCH: 27.8 pg (ref 26.0–34.0)
MCHC: 33.5 g/dL (ref 30.0–36.0)
MCV: 82.9 fL (ref 78.0–100.0)
Monocytes Absolute: 0.4 10*3/uL (ref 0.1–1.0)
Monocytes Relative: 5 %
NEUTROS ABS: 5.2 10*3/uL (ref 1.7–7.7)
NEUTROS PCT: 67 %
Platelets: 227 10*3/uL (ref 150–400)
RBC: 4.39 MIL/uL (ref 3.87–5.11)
RDW: 13.6 % (ref 11.5–15.5)
WBC: 7.8 10*3/uL (ref 4.0–10.5)

## 2018-04-19 LAB — WET PREP, GENITAL
Sperm: NONE SEEN
Trich, Wet Prep: NONE SEEN
Yeast Wet Prep HPF POC: NONE SEEN

## 2018-04-19 LAB — URINALYSIS, ROUTINE W REFLEX MICROSCOPIC
Bacteria, UA: NONE SEEN
Bilirubin Urine: NEGATIVE
GLUCOSE, UA: NEGATIVE mg/dL
Hgb urine dipstick: NEGATIVE
Ketones, ur: 80 mg/dL — AB
NITRITE: NEGATIVE
PH: 5 (ref 5.0–8.0)
PROTEIN: 100 mg/dL — AB
Specific Gravity, Urine: 1.032 — ABNORMAL HIGH (ref 1.005–1.030)

## 2018-04-19 LAB — POCT PREGNANCY, URINE: Preg Test, Ur: POSITIVE — AB

## 2018-04-19 MED ORDER — DIPHENHYDRAMINE HCL 50 MG/ML IJ SOLN
25.0000 mg | Freq: Once | INTRAMUSCULAR | Status: AC
  Administered 2018-04-19: 25 mg via INTRAVENOUS
  Filled 2018-04-19: qty 1

## 2018-04-19 MED ORDER — PROMETHAZINE HCL 25 MG RE SUPP: 25.0000 mg | Freq: Four times a day (QID) | RECTAL | 3 refills | Status: DC | PRN

## 2018-04-19 MED ORDER — METRONIDAZOLE 0.75 % VA GEL: 1.0000 | Freq: Every day | VAGINAL | 0 refills | Status: AC

## 2018-04-19 MED ORDER — FAMOTIDINE IN NACL 20-0.9 MG/50ML-% IV SOLN
20.0000 mg | Freq: Once | INTRAVENOUS | Status: AC
  Administered 2018-04-19: 20 mg via INTRAVENOUS
  Filled 2018-04-19: qty 50

## 2018-04-19 MED ORDER — DEXTROSE 5 % IN LACTATED RINGERS IV BOLUS
1000.0000 mL | Freq: Once | INTRAVENOUS | Status: AC
  Administered 2018-04-19: 1000 mL via INTRAVENOUS

## 2018-04-19 MED ORDER — PROMETHAZINE HCL 25 MG/ML IJ SOLN
25.0000 mg | Freq: Once | INTRAMUSCULAR | Status: AC
  Administered 2018-04-19: 25 mg via INTRAVENOUS
  Filled 2018-04-19: qty 1

## 2018-04-19 NOTE — MAU Provider Note (Addendum)
History     CSN: [id number]  Arrival date and time: 04/19/18 1835   None     Chief Complaint  Patient presents with  . Emesis  . Abdominal Cramping   HPI Lauren Costa is 22 y.o. G2P1001 [redacted]w[redacted]d weeks presenting with abdominal cramping and vomiting X 3 weeks.  Worsened today, vomiting 8 times.  Neg for vaginal bleeding. Rates worse pain 8/10 at present 0/10.  Has "slight" headache rating it as 5/10.  She has not taken any medication for cramping/headache. Was seen 8/16 for cramping and had + UPT.  U/S at that time showed her to be [redacted]w[redacted]d by LMP-No IUP, YS or CA seen.  CLC on the right.  BHCG 246.  Blood type O positive.  She was scheduled for f/u but did not come back. States she started a new job and cannot miss work. She is not sure if she will continue the pregnancy.  Plans care at [facility] if continues.She was seen at [facility] in August- dx with BV unable to keep pills down. STI Neg at that visit.   Past Medical History:  Diagnosis Date  . Anxiety    as a child  . Chronic kidney disease    kidney infection  . Headache     Past Surgical History:  Procedure Laterality Date  . DENTAL SURGERY      Family History  Problem Relation Age of Onset  . Cancer Maternal Aunt   . Cancer Maternal Uncle   . Alcohol abuse Neg Hx   . Arthritis Neg Hx   . Asthma Neg Hx   . Birth defects Neg Hx   . COPD Neg Hx   . Depression Neg Hx   . Diabetes Neg Hx   . Drug abuse Neg Hx   . Early death Neg Hx   . Hearing loss Neg Hx   . Heart disease Neg Hx   . Hyperlipidemia Neg Hx   . Hypertension Neg Hx   . Kidney disease Neg Hx   . Learning disabilities Neg Hx   . Mental illness Neg Hx   . Mental retardation Neg Hx   . Miscarriages / Stillbirths Neg Hx   . Stroke Neg Hx   . Vision loss Neg Hx   . Varicose Veins Neg Hx     Social History   Tobacco Use  . Smoking status: Former Smoker    Last attempt to quit: 04/19/2016    Years since quitting: 2.0  . Smokeless tobacco: Former Estate agent Use Topics  . Alcohol use: No  . Drug use: No    Allergies:  Allergies  Allergen Reactions  . Peanut-Containing Drug Products Hives    Medications Prior to Admission  Medication Sig Dispense Refill Last Dose  . pantoprazole (PROTONIX) 20 MG tablet Take 1 tablet (20 mg total) by mouth daily. (Patient not taking: Reported on 10/04/2016) 30 tablet 1 Not Taking  . Prenatal Vit-Fe Fumarate-FA (PRENATAL MULTIVITAMIN) TABS tablet Take 1 tablet by mouth daily.    Past Week at Unknown time  . senna-docusate (SENOKOT-S) 8.6-50 MG tablet Take 2 tablets by mouth at bedtime. 60 tablet 2     Review of Systems  Constitutional: Positive for appetite change. Negative for fever.  Respiratory: Negative for chest tightness and shortness of breath.   Cardiovascular: Negative for chest pain.  Gastrointestinal: Positive for nausea and vomiting.  Genitourinary: Positive for vaginal discharge. Negative for dysuria, flank pain and vaginal bleeding.  Neurological: Positive  for headaches.   Physical Exam   Blood pressure 126/78, pulse 69, temperature 98.5 F (36.9 C), temperature source Oral, resp. rate 18, height 5\' 5"  (1.651 m), weight 97.1 kg, last menstrual period 02/17/2018, SpO2 100 %, unknown if currently breastfeeding.  Physical Exam  Nursing note and vitals reviewed. Constitutional: She is oriented to person, place, and time. She appears well-developed and well-nourished. No distress.  HENT:  Head: Normocephalic.  Neck: Normal range of motion.  Cardiovascular: Normal rate.  Respiratory: Effort normal.  GI: Soft. She exhibits no distension and no mass. There is no tenderness. There is no rebound and no guarding.  Genitourinary:  Genitourinary Comments: Swab collected for wet prep   Pelvic deferred neg for vaginal bleeding.   Neurological: She is alert and oriented to person, place, and time.  Skin: Skin is warm and dry.  Psychiatric: She has a normal mood and affect. Her behavior  is normal. Thought content normal.   Results for orders placed or performed during the hospital encounter of 04/19/18 (from the past 24 hour(s))  Pregnancy, urine POC     Status: Abnormal   Collection Time: 04/19/18  7:47 PM  Result Value Ref Range   Preg Test, Ur POSITIVE (A) NEGATIVE  Urinalysis, Routine w reflex microscopic     Status: Abnormal   Collection Time: 04/19/18  7:49 PM  Result Value Ref Range   Color, Urine AMBER (A) YELLOW   APPearance CLEAR CLEAR   Specific Gravity, Urine 1.032 (H) 1.005 - 1.030   pH 5.0 5.0 - 8.0   Glucose, UA NEGATIVE NEGATIVE mg/dL   Hgb urine dipstick NEGATIVE NEGATIVE   Bilirubin Urine NEGATIVE NEGATIVE   Ketones, ur 80 (A) NEGATIVE mg/dL   Protein, ur 161 (A) NEGATIVE mg/dL   Nitrite NEGATIVE NEGATIVE   Leukocytes, UA SMALL (A) NEGATIVE   RBC / HPF 0-5 0 - 5 RBC/hpf   WBC, UA 0-5 0 - 5 WBC/hpf   Bacteria, UA NONE SEEN NONE SEEN   Squamous Epithelial / LPF 0-5 0 - 5   Mucus PRESENT   Wet prep, genital     Status: Abnormal   Collection Time: 04/19/18  8:42 PM  Result Value Ref Range   Yeast Wet Prep HPF POC NONE SEEN NONE SEEN   Trich, Wet Prep NONE SEEN NONE SEEN   Clue Cells Wet Prep HPF POC PRESENT (A) NONE SEEN   WBC, Wet Prep HPF POC FEW (A) NONE SEEN   Sperm NONE SEEN   CBC with Differential/Platelet     Status: None   Collection Time: 04/19/18  8:55 PM  Result Value Ref Range   WBC 7.8 4.0 - 10.5 K/uL   RBC 4.39 3.87 - 5.11 MIL/uL   Hemoglobin 12.2 12.0 - 15.0 g/dL   HCT 09.6 04.5 - 40.9 %   MCV 82.9 78.0 - 100.0 fL   MCH 27.8 26.0 - 34.0 pg   MCHC 33.5 30.0 - 36.0 g/dL   RDW 81.1 91.4 - 78.2 %   Platelets 227 150 - 400 K/uL   Neutrophils Relative % 67 %   Neutro Abs 5.2 1.7 - 7.7 K/uL   Lymphocytes Relative 28 %   Lymphs Abs 2.2 0.7 - 4.0 K/uL   Monocytes Relative 5 %   Monocytes Absolute 0.4 0.1 - 1.0 K/uL   Eosinophils Relative 0 %   Eosinophils Absolute 0.0 0.0 - 0.7 K/uL   Basophils Relative 0 %   Basophils  Absolute 0.0 0.0 - 0.1  K/uL  Comprehensive metabolic panel     Status: Abnormal   Collection Time: 04/19/18  8:55 PM  Result Value Ref Range   Sodium 135 135 - 145 mmol/L   Potassium 3.8 3.5 - 5.1 mmol/L   Chloride 104 98 - 111 mmol/L   CO2 20 (L) 22 - 32 mmol/L   Glucose, Bld 83 70 - 99 mg/dL   BUN 6 6 - 20 mg/dL   Creatinine, Ser 2.84 0.44 - 1.00 mg/dL   Calcium 9.1 8.9 - 13.2 mg/dL   Total Protein 7.5 6.5 - 8.1 g/dL   Albumin 3.9 3.5 - 5.0 g/dL   AST 16 15 - 41 U/L   ALT 12 0 - 44 U/L   Alkaline Phosphatase 70 38 - 126 U/L   Total Bilirubin 1.0 0.3 - 1.2 mg/dL   GFR calc non Af Amer >60 >60 mL/min   GFR calc Af Amer >60 >60 mL/min   Anion gap 11 5 - 15   US Ob Transvaginal  Result Date: 04/19/2018 CLINICAL DATA:  Cramping EXAM: TRANSVAGINAL OB ULTRASOUND TECHNIQUE: Transvaginal ultrasound was performed for complete evaluation of the gestation as well as the maternal uterus, adnexal regions, and pelvic cul-de-sac. COMPARISON:  03/26/2018 FINDINGS: Intrauterine gestational sac: Visible Yolk sac:  Visible Embryo:  Visible Cardiac Activity: Visible Heart Rate: 157 bpm CRL: 16.2 mm   8 w 0 d                  [place] EDC: 11/29/2018 Subchorionic hemorrhage:  None visualized. Maternal uterus/adnexae: Ovaries are within normal limits. The right ovary measures 3.9 x 3.1 by 2.8 cm and contains a corpus luteal cyst. The left ovary measures 1.2 x 2.9 x 1.4 cm. Trace free fluid in the pelvis IMPRESSION: Single viable intrauterine pregnancy as above. Trace free fluid in the pelvis. Electronically Signed   By: Jasmine Pang M.D.   On: 04/19/2018 21:56    MAU Course  Procedures  Recent STI screening at [facility] Neg  Not repeated tonight  MDM MSE Exam Labs IV LR hydration with Phenergan 25mg  [place] Care turned over to E. Lyman Bishop, NP Eve M Key 04/19/2018 8:51 PM  Ultrasound shows IUP w/cardiac activity Patient vomiting after IV phenergan. 2nd bag of IV fluids ordered as well as pepcid & benadryl -- "feels  better"  Care turned over to [facility], NP 04/19/2018 10:02 PM   Assessment and Plan  Nausea/vomiting in pregnancy  - Rx for Phenergan 25 mg supp vaginally prn N/V - Information provided on morning sickness   Bacterial vaginitis - Rx for Metrogel vaginally hs x 5 days - Information provided on BV  - Discharge home - Schedule appt with [facility] - Patient verbalized an understanding of the plan of care and agrees.   Raelyn Mora, CNM 04/19/2018 11:54 PM

## 2018-04-19 NOTE — MAU Note (Signed)
Pt here with c/o vomiting for the past 3 weeks and having cramping. Denies any bleeding. Was here before and pregnancy test was positive, was supposed to come back for F/U labs and did not.

## 2018-08-12 ENCOUNTER — Inpatient Hospital Stay (HOSPITAL_COMMUNITY)
Admission: AD | Admit: 2018-08-12 | Discharge: 2018-08-12 | Disposition: A | Discharge status: Home / Self Care | Payer: Medicaid Other | Attending: Obstetrics & Gynecology | Admitting: Obstetrics & Gynecology

## 2018-08-12 ENCOUNTER — Encounter (HOSPITAL_COMMUNITY): Payer: Self-pay | Admitting: *Deleted

## 2018-08-12 ENCOUNTER — Other Ambulatory Visit: Payer: Self-pay

## 2018-08-12 DIAGNOSIS — Z87891 Personal history of nicotine dependence: Secondary | ICD-10-CM | POA: Diagnosis not present

## 2018-08-12 DIAGNOSIS — N760 Acute vaginitis: Secondary | ICD-10-CM | POA: Diagnosis not present

## 2018-08-12 DIAGNOSIS — B9689 Other specified bacterial agents as the cause of diseases classified elsewhere: Secondary | ICD-10-CM

## 2018-08-12 DIAGNOSIS — N898 Other specified noninflammatory disorders of vagina: Secondary | ICD-10-CM | POA: Diagnosis present

## 2018-08-12 DIAGNOSIS — Z3202 Encounter for pregnancy test, result negative: Secondary | ICD-10-CM | POA: Insufficient documentation

## 2018-08-12 LAB — URINALYSIS, ROUTINE W REFLEX MICROSCOPIC
Bilirubin Urine: NEGATIVE
Glucose, UA: NEGATIVE mg/dL
HGB URINE DIPSTICK: NEGATIVE
Ketones, ur: NEGATIVE mg/dL
Leukocytes, UA: NEGATIVE
Nitrite: NEGATIVE
Protein, ur: NEGATIVE mg/dL
SPECIFIC GRAVITY, URINE: 1.03 (ref 1.005–1.030)
pH: 5 (ref 5.0–8.0)

## 2018-08-12 LAB — WET PREP, GENITAL
CLUE CELLS WET PREP: NONE SEEN
Sperm: NONE SEEN
Trich, Wet Prep: NONE SEEN
YEAST WET PREP: NONE SEEN

## 2018-08-12 LAB — POCT PREGNANCY, URINE: PREG TEST UR: NEGATIVE

## 2018-08-12 MED ORDER — METRONIDAZOLE 500 MG PO TABS: 500.0000 mg | ORAL_TABLET | Freq: Two times a day (BID) | ORAL | 0 refills | Status: DC

## 2018-08-12 NOTE — MAU Note (Signed)
Had an abortion 2 months..  Has been irregularly bleeding since.  Is not bleeding now.  Has a d/c now, is white/clear with a funny d/c.

## 2018-08-12 NOTE — Discharge Instructions (Signed)
Contraception Choices  Contraception, also called birth control, refers to methods or devices that prevent pregnancy.  Hormonal methods  Contraceptive implant    A contraceptive implant is a thin, plastic tube that contains a hormone. It is inserted into the upper part of the arm. It can remain in place for up to 3 years.  Progestin-only injections  Progestin-only injections are injections of progestin, a synthetic form of the hormone progesterone. They are given every 3 months by a health care provider.  Birth control pills    Birth control pills are pills that contain hormones that prevent pregnancy. They must be taken once a day, preferably at the same time each day.  Birth control patch    The birth control patch contains hormones that prevent pregnancy. It is placed on the skin and must be changed once a week for three weeks and removed on the fourth week. A prescription is needed to use this method of contraception.  Vaginal ring    A vaginal ring contains hormones that prevent pregnancy. It is placed in the vagina for three weeks and removed on the fourth week. After that, the process is repeated with a new ring. A prescription is needed to use this method of contraception.  Emergency contraceptive  Emergency contraceptives prevent pregnancy after unprotected sex. They come in pill form and can be taken up to 5 days after sex. They work best the sooner they are taken after having sex. Most emergency contraceptives are available without a prescription. This method should not be used as your only form of birth control.  Barrier methods  Female condom    A female condom is a thin sheath that is worn over the penis during sex. Condoms keep sperm from going inside a woman's body. They can be used with a spermicide to increase their effectiveness. They should be disposed after a single use.  Female condom    A female condom is a soft, loose-fitting sheath that is put into the vagina before sex. The condom keeps sperm  from going inside a woman's body. They should be disposed after a single use.  Diaphragm    A diaphragm is a soft, dome-shaped barrier. It is inserted into the vagina before sex, along with a spermicide. The diaphragm blocks sperm from entering the uterus, and the spermicide kills sperm. A diaphragm should be left in the vagina for 6-8 hours after sex and removed within 24 hours.  A diaphragm is prescribed and fitted by a health care provider. A diaphragm should be replaced every 1-2 years, after giving birth, after gaining more than 15 lb (6.8 kg), and after pelvic surgery.  Cervical cap    A cervical cap is a round, soft latex or plastic cup that fits over the cervix. It is inserted into the vagina before sex, along with spermicide. It blocks sperm from entering the uterus. The cap should be left in place for 6-8 hours after sex and removed within 48 hours. A cervical cap must be prescribed and fitted by a health care provider. It should be replaced every 2 years.  Sponge    A sponge is a soft, circular piece of polyurethane foam with spermicide on it. The sponge helps block sperm from entering the uterus, and the spermicide kills sperm. To use it, you make it wet and then insert it into the vagina. It should be inserted before sex, left in for at least 6 hours after sex, and removed and thrown away within   30 hours.  Spermicides  Spermicides are chemicals that kill or block sperm from entering the cervix and uterus. They can come as a cream, jelly, suppository, foam, or tablet. A spermicide should be inserted into the vagina with an applicator at least 10-15 minutes before sex to allow time for it to work. The process must be repeated every time you have sex. Spermicides do not require a prescription.  Intrauterine contraception  Intrauterine device (IUD)  An IUD is a T-shaped device that is put in a woman's uterus. There are two types:   Hormone IUD.This type contains progestin, a synthetic form of the hormone  progesterone. This type can stay in place for 3-5 years.   Copper IUD.This type is wrapped in copper wire. It can stay in place for 10 years.    Permanent methods of contraception  Female tubal ligation  In this method, a woman's fallopian tubes are sealed, tied, or blocked during surgery to prevent eggs from traveling to the uterus.  Hysteroscopic sterilization  In this method, a small, flexible insert is placed into each fallopian tube. The inserts cause scar tissue to form in the fallopian tubes and block them, so sperm cannot reach an egg. The procedure takes about 3 months to be effective. Another form of birth control must be used during those 3 months.  Female sterilization  This is a procedure to tie off the tubes that carry sperm (vasectomy). After the procedure, the man can still ejaculate fluid (semen).  Natural planning methods  Natural family planning  In this method, a couple does not have sex on days when the woman could become pregnant.  Calendar method  This means keeping track of the length of each menstrual cycle, identifying the days when pregnancy can happen, and not having sex on those days.  Ovulation method  In this method, a couple avoids sex during ovulation.  Symptothermal method  This method involves not having sex during ovulation. The woman typically checks for ovulation by watching changes in her temperature and in the consistency of cervical mucus.  Post-ovulation method  In this method, a couple waits to have sex until after ovulation.  Summary   Contraception, also called birth control, means methods or devices that prevent pregnancy.   Hormonal methods of contraception include implants, injections, pills, patches, vaginal rings, and emergency contraceptives.   Barrier methods of contraception can include female condoms, female condoms, diaphragms, cervical caps, sponges, and spermicides.   There are two types of IUDs (intrauterine devices). An IUD can be put in a woman's uterus to  prevent pregnancy for 3-5 years.   Permanent sterilization can be done through a procedure for males, females, or both.   Natural family planning methods involve not having sex on days when the woman could become pregnant.  This information is not intended to replace advice given to you by your health care provider. Make sure you discuss any questions you have with your health care provider.  Document Released: 07/28/2005 Document Revised: 07/30/2017 Document Reviewed: 08/30/2016  Elsevier Interactive Patient Education  2019 [employer]

## 2018-08-12 NOTE — MAU Provider Note (Signed)
Chief Complaint: Vaginal Discharge   First Provider Initiated Contact with Patient 08/12/18 1645     SUBJECTIVE HPI: Lauren Costa is a 23 y.o. G2P1011 non pregnant female who presents to Maternity Admissions reporting vaginal discharge. Had an abortion a few months ago. Was prescribed OCPs but discontinued them after a few weeks. Reports some irregular bleeding since then. No bleeding since the beginning of December. Since then has had foul smelling discharge that she is adamant is bacterial vaginosis. Reports begin dx with BV at the beginning of that pregnancy but didn't take the treatment d/t events surrounding the TAB.  Denies abdominal pain, dysuria, dyspareunia, postcoital bleeding. Is in monogamous relationship with partner of years and does not use condoms.    Past Medical History:  Diagnosis Date  . Anxiety    as a child  . Bilateral breast lump 03/19/2016  . Chronic kidney disease    kidney infection  . Headache    OB History  Gravida Para Term Preterm AB Living  2 1 1   1 1   SAB TAB Ectopic Multiple Live Births  0 1   0 1    # Outcome Date GA Lbr Len/2nd Weight Sex Delivery Anes PTL Lv  2 TAB 05/2018          1 Term 10/21/16 [redacted]w[redacted]d 28:00 / 00:28 3391 g F Vag-Spont EPI  LIV   Past Surgical History:  Procedure Laterality Date  . DENTAL SURGERY     Social History   Socioeconomic History  . Marital status: Single    Spouse name: Not on file  . Number of children: Not on file  . Years of education: Not on file  . Highest education level: Not on file  Occupational History  . Not on file  Social Needs  . Financial resource strain: Not on file  . Food insecurity:    Worry: Not on file    Inability: Not on file  . Transportation needs:    Medical: Not on file    Non-medical: Not on file  Tobacco Use  . Smoking status: Former Smoker    Last attempt to quit: 04/19/2016    Years since quitting: 2.3  . Smokeless tobacco: Former Engineer, water and Sexual Activity  .  Alcohol use: No  . Drug use: No  . Sexual activity: Yes    Partners: Male    Birth control/protection: None    Comment: last sex 13 Apr 2018  Lifestyle  . Physical activity:    Days per week: Not on file    Minutes per session: Not on file  . Stress: Not on file  Relationships  . Social connections:    Talks on phone: Not on file    Gets together: Not on file    Attends religious service: Not on file    Active member of club or organization: Not on file    Attends meetings of clubs or organizations: Not on file    Relationship status: Not on file  . Intimate partner violence:    Fear of current or ex partner: Not on file    Emotionally abused: Not on file    Physically abused: Not on file    Forced sexual activity: Not on file  Other Topics Concern  . Not on file  Social History Narrative  . Not on file   Family History  Problem Relation Age of Onset  . Cancer Maternal Aunt   . Cancer Maternal Uncle   .  Alcohol abuse Neg Hx   . Arthritis Neg Hx   . Asthma Neg Hx   . Birth defects Neg Hx   . COPD Neg Hx   . Depression Neg Hx   . Diabetes Neg Hx   . Drug abuse Neg Hx   . Early death Neg Hx   . Hearing loss Neg Hx   . Heart disease Neg Hx   . Hyperlipidemia Neg Hx   . Hypertension Neg Hx   . Kidney disease Neg Hx   . Learning disabilities Neg Hx   . Mental illness Neg Hx   . Mental retardation Neg Hx   . Miscarriages / Stillbirths Neg Hx   . Stroke Neg Hx   . Vision loss Neg Hx   . Varicose Veins Neg Hx    No current facility-administered medications on file prior to encounter.    Current Outpatient Medications on File Prior to Encounter  Medication Sig Dispense Refill  . Prenatal Vit-Fe Fumarate-FA (PRENATAL MULTIVITAMIN) TABS tablet Take 1 tablet by mouth daily.     . promethazine (PHENERGAN) 25 MG suppository Place 1 suppository (25 mg total) rectally every 6 (six) hours as needed for nausea or vomiting. Use vaginally and not rectally 12 each 3   Allergies   Allergen Reactions  . Peanut-Containing Drug Products Hives    I have reviewed patient's Past Medical Hx, Surgical Hx, Family Hx, Social Hx, medications and allergies.   Review of Systems  Constitutional: Negative.   Gastrointestinal: Negative.   Genitourinary: Positive for vaginal discharge. Negative for dyspareunia, dysuria and vaginal bleeding.    OBJECTIVE Patient Vitals for the past 24 hrs:  BP Temp Temp src Pulse Resp SpO2 Weight  08/12/18 1617 129/66 98.2 F (36.8 C) Oral 76 16 100 % 94.9 kg   Constitutional: Well-developed, well-nourished female in no acute distress.  Cardiovascular: normal rate & rhythm, no murmur Respiratory: normal rate and effort. Lung sounds clear throughout GI: Abd soft, non-tender, Pos BS x 4. No guarding or rebound tenderness MS: Extremities nontender, no edema, normal ROM Neurologic: Alert and oriented x 4.     LAB RESULTS Results for orders placed or performed during the hospital encounter of 08/12/18 (from the past 24 hour(s))  Urinalysis, Routine w reflex microscopic     Status: None   Collection Time: 08/12/18  4:33 PM  Result Value Ref Range   Color, Urine YELLOW YELLOW   APPearance CLEAR CLEAR   Specific Gravity, Urine 1.030 1.005 - 1.030   pH 5.0 5.0 - 8.0   Glucose, UA NEGATIVE NEGATIVE mg/dL   Hgb urine dipstick NEGATIVE NEGATIVE   Bilirubin Urine NEGATIVE NEGATIVE   Ketones, ur NEGATIVE NEGATIVE mg/dL   Protein, ur NEGATIVE NEGATIVE mg/dL   Nitrite NEGATIVE NEGATIVE   Leukocytes, UA NEGATIVE NEGATIVE  Pregnancy, urine POC     Status: None   Collection Time: 08/12/18  4:44 PM  Result Value Ref Range   Preg Test, Ur NEGATIVE NEGATIVE  Wet prep, genital     Status: Abnormal   Collection Time: 08/12/18  5:10 PM  Result Value Ref Range   Yeast Wet Prep HPF POC NONE SEEN NONE SEEN   Trich, Wet Prep NONE SEEN NONE SEEN   Clue Cells Wet Prep HPF POC NONE SEEN NONE SEEN   WBC, Wet Prep HPF POC FEW (A) NONE SEEN   Sperm NONE  SEEN     IMAGING No results found.  MAU COURSE Orders Placed This Encounter  Procedures  .  Wet prep, genital  . Urinalysis, Routine w reflex microscopic  . Pregnancy, urine POC  . Discharge patient   Meds ordered this encounter  Medications  . metroNIDAZOLE (FLAGYL) 500 MG tablet    Sig: Take 1 tablet (500 mg total) by mouth 2 (two) times daily.    Dispense:  14 tablet    Refill:  0    Order Specific Question:   Supervising Provider    Answer:   Jaynie CollinsANYANWU, UGONNA A [[id number]]    MDM UPT negative GC/CT & wet prep collected  Pt interested in nexplanon but has trouble getting appointments d/t work schedule. Discussed after hours clinic downstairs. Given list of contraceptive options and encouraged patient to schedule f/u  ASSESSMENT 1. BV (bacterial vaginosis)   2. Negative pregnancy test     PLAN Discharge home in stable condition. GC/CT pending Follow-up Information    [facility]   Specialty:  Obstetrics and Gynecology Contact information: [address] [place] [id number] [phone]         Allergies as of 08/12/2018      Reactions   Peanut-containing Drug Products Hives      Medication List    STOP taking these medications   pantoprazole 20 MG tablet Commonly known as:  PROTONIX     TAKE these medications   metroNIDAZOLE 500 MG tablet Commonly known as:  FLAGYL Take 1 tablet (500 mg total) by mouth 2 (two) times daily.   prenatal multivitamin Tabs tablet Take 1 tablet by mouth daily.   promethazine 25 MG suppository Commonly known as:  PHENERGAN Place 1 suppository (25 mg total) rectally every 6 (six) hours as needed for nausea or vomiting. Use vaginally and not rectally        Judeth HornLawrence, Shravya Wickwire, NP 08/12/2018  9:23 PM

## 2018-08-13 LAB — GC/CHLAMYDIA PROBE AMP (~~LOC~~) NOT AT ARMC
CHLAMYDIA, DNA PROBE: NEGATIVE
NEISSERIA GONORRHEA: NEGATIVE

## 2018-12-30 ENCOUNTER — Other Ambulatory Visit: Payer: Self-pay

## 2018-12-30 ENCOUNTER — Inpatient Hospital Stay (HOSPITAL_COMMUNITY): Payer: Medicaid Other

## 2018-12-30 ENCOUNTER — Inpatient Hospital Stay (HOSPITAL_COMMUNITY)
Admission: AD | Admit: 2018-12-30 | Discharge: 2018-12-30 | Disposition: A | Discharge status: Home / Self Care | Payer: Medicaid Other | Attending: Obstetrics and Gynecology | Admitting: Obstetrics and Gynecology

## 2018-12-30 ENCOUNTER — Encounter (HOSPITAL_COMMUNITY): Payer: Self-pay | Admitting: *Deleted

## 2018-12-30 DIAGNOSIS — O208 Other hemorrhage in early pregnancy: Secondary | ICD-10-CM | POA: Insufficient documentation

## 2018-12-30 DIAGNOSIS — R102 Pelvic and perineal pain: Secondary | ICD-10-CM | POA: Diagnosis not present

## 2018-12-30 DIAGNOSIS — O3680X0 Pregnancy with inconclusive fetal viability, not applicable or unspecified: Secondary | ICD-10-CM | POA: Diagnosis not present

## 2018-12-30 DIAGNOSIS — O26891 Other specified pregnancy related conditions, first trimester: Secondary | ICD-10-CM

## 2018-12-30 DIAGNOSIS — Z87891 Personal history of nicotine dependence: Secondary | ICD-10-CM | POA: Diagnosis not present

## 2018-12-30 DIAGNOSIS — N189 Chronic kidney disease, unspecified: Secondary | ICD-10-CM | POA: Diagnosis not present

## 2018-12-30 DIAGNOSIS — O26831 Pregnancy related renal disease, first trimester: Secondary | ICD-10-CM | POA: Insufficient documentation

## 2018-12-30 DIAGNOSIS — Z3A01 Less than 8 weeks gestation of pregnancy: Secondary | ICD-10-CM | POA: Diagnosis not present

## 2018-12-30 DIAGNOSIS — R109 Unspecified abdominal pain: Secondary | ICD-10-CM

## 2018-12-30 LAB — COMPREHENSIVE METABOLIC PANEL
ALT: 14 U/L (ref 0–44)
AST: 15 U/L (ref 15–41)
Albumin: 3.7 g/dL (ref 3.5–5.0)
Alkaline Phosphatase: 75 U/L (ref 38–126)
Anion gap: 8 (ref 5–15)
BUN: 8 mg/dL (ref 6–20)
CO2: 26 mmol/L (ref 22–32)
Calcium: 9.3 mg/dL (ref 8.9–10.3)
Chloride: 104 mmol/L (ref 98–111)
Creatinine, Ser: 0.71 mg/dL (ref 0.44–1.00)
GFR calc Af Amer: >60 mL/min (ref >60)
GFR calc non Af Amer: >60 mL/min (ref >60)
Glucose, Bld: 86 mg/dL (ref 70–99)
Potassium: 4 mmol/L (ref 3.5–5.1)
Sodium: 138 mmol/L (ref 135–145)
Total Bilirubin: 0.5 mg/dL (ref 0.3–1.2)
Total Protein: 7 g/dL (ref 6.5–8.1)

## 2018-12-30 LAB — WET PREP, GENITAL
Clue Cells Wet Prep HPF POC: NONE SEEN
Sperm: NONE SEEN
Trich, Wet Prep: NONE SEEN
Yeast Wet Prep HPF POC: NONE SEEN

## 2018-12-30 LAB — CBC
HCT: 36.8 % (ref 36.0–46.0)
Hemoglobin: 11.7 g/dL — ABNORMAL LOW (ref 12.0–15.0)
MCH: 26 pg (ref 26.0–34.0)
MCHC: 31.8 g/dL (ref 30.0–36.0)
MCV: 81.8 fL (ref 80.0–100.0)
Platelets: 249 10*3/uL (ref 150–400)
RBC: 4.5 MIL/uL (ref 3.87–5.11)
RDW: 14 % (ref 11.5–15.5)
WBC: 8.1 10*3/uL (ref 4.0–10.5)
nRBC: 0 % (ref 0.0–0.2)

## 2018-12-30 LAB — URINALYSIS, ROUTINE W REFLEX MICROSCOPIC
Bilirubin Urine: NEGATIVE
Glucose, UA: NEGATIVE mg/dL
Hgb urine dipstick: NEGATIVE
Ketones, ur: NEGATIVE mg/dL
Leukocytes,Ua: NEGATIVE
Nitrite: NEGATIVE
Protein, ur: NEGATIVE mg/dL
Specific Gravity, Urine: 1.025 (ref 1.005–1.030)
pH: 6 (ref 5.0–8.0)

## 2018-12-30 LAB — POCT PREGNANCY, URINE: Preg Test, Ur: POSITIVE — AB

## 2018-12-30 LAB — HCG, QUANTITATIVE, PREGNANCY: hCG, Beta Chain, Quant, S: 3732 m[IU]/mL — ABNORMAL HIGH (ref <5)

## 2018-12-30 NOTE — Discharge Instructions (Signed)
First Trimester of Pregnancy  The first trimester of pregnancy is from week 1 until the end of week 13 (months 1 through 3). A week after a sperm fertilizes an egg, the egg will implant on the wall of the uterus. This embryo will begin to develop into a baby. Genes from you and your partner will form the baby. The female genes will determine whether the baby will be a boy or a girl. At 6-8 weeks, the eyes and face will be formed, and the heartbeat can be seen on ultrasound. At the end of 12 weeks, all the baby's organs will be formed.  Now that you are pregnant, you will want to do everything you can to have a healthy baby. Two of the most important things are to get good prenatal care and to follow your health care provider's instructions. Prenatal care is all the medical care you receive before the baby's birth. This care will help prevent, find, and treat any problems during the pregnancy and childbirth.  Body changes during your first trimester  Your body goes through many changes during pregnancy. The changes vary from woman to woman.   You may gain or lose a couple of pounds at first.   You may feel sick to your stomach (nauseous) and you may throw up (vomit). If the vomiting is uncontrollable, call your health care provider.   You may tire easily.   You may develop headaches that can be relieved by medicines. All medicines should be approved by your health care provider.   You may urinate more often. Painful urination may mean you have a bladder infection.   You may develop heartburn as a result of your pregnancy.   You may develop constipation because certain hormones are causing the muscles that push stool through your intestines to slow down.   You may develop hemorrhoids or swollen veins (varicose veins).   Your breasts may begin to grow larger and become tender. Your nipples may stick out more, and the tissue that surrounds them (areola) may become darker.   Your gums may bleed and may be  sensitive to brushing and flossing.   Dark spots or blotches (chloasma, mask of pregnancy) may develop on your face. This will likely fade after the baby is born.   Your menstrual periods will stop.   You may have a loss of appetite.   You may develop cravings for certain kinds of food.   You may have changes in your emotions from day to day, such as being excited to be pregnant or being concerned that something may go wrong with the pregnancy and baby.   You may have more vivid and strange dreams.   You may have changes in your hair. These can include thickening of your hair, rapid growth, and changes in texture. Some women also have hair loss during or after pregnancy, or hair that feels dry or thin. Your hair will most likely return to normal after your baby is born.  What to expect at prenatal visits  During a routine prenatal visit:   You will be weighed to make sure you and the baby are growing normally.   Your blood pressure will be taken.   Your abdomen will be measured to track your baby's growth.   The fetal heartbeat will be listened to between weeks 10 and 14 of your pregnancy.   Test results from any previous visits will be discussed.  Your health care provider may ask you:     How you are feeling.   If you are feeling the baby move.   If you have had any abnormal symptoms, such as leaking fluid, bleeding, severe headaches, or abdominal cramping.   If you are using any tobacco products, including cigarettes, chewing tobacco, and electronic cigarettes.   If you have any questions.  Other tests that may be performed during your first trimester include:   Blood tests to find your blood type and to check for the presence of any previous infections. The tests will also be used to check for low iron levels (anemia) and protein on red blood cells (Rh antibodies). Depending on your risk factors, or if you previously had diabetes during pregnancy, you may have tests to check for high blood sugar  that affects pregnant women (gestational diabetes).   Urine tests to check for infections, diabetes, or protein in the urine.   An ultrasound to confirm the proper growth and development of the baby.   Fetal screens for spinal cord problems (spina bifida) and Down syndrome.   HIV (human immunodeficiency virus) testing. Routine prenatal testing includes screening for HIV, unless you choose not to have this test.   You may need other tests to make sure you and the baby are doing well.  Follow these instructions at home:  Medicines   Follow your health care provider's instructions regarding medicine use. Specific medicines may be either safe or unsafe to take during pregnancy.   Take a prenatal vitamin that contains at least 600 micrograms (mcg) of folic acid.   If you develop constipation, try taking a stool softener if your health care provider approves.  Eating and drinking     Eat a balanced diet that includes fresh fruits and vegetables, whole grains, good sources of protein such as meat, eggs, or tofu, and low-fat dairy. Your health care provider will help you determine the amount of weight gain that is right for you.   Avoid raw meat and uncooked cheese. These carry germs that can cause birth defects in the baby.   Eating four or five small meals rather than three large meals a day may help relieve nausea and vomiting. If you start to feel nauseous, eating a few soda crackers can be helpful. Drinking liquids between meals, instead of during meals, also seems to help ease nausea and vomiting.   Limit foods that are high in fat and processed sugars, such as fried and sweet foods.   To prevent constipation:  ? Eat foods that are high in fiber, such as fresh fruits and vegetables, whole grains, and beans.  ? Drink enough fluid to keep your urine clear or pale yellow.  Activity   Exercise only as directed by your health care provider. Most women can continue their usual exercise routine during  pregnancy. Try to exercise for 30 minutes at least 5 days a week. Exercising will help you:  ? Control your weight.  ? Stay in shape.  ? Be prepared for labor and delivery.   Experiencing pain or cramping in the lower abdomen or lower back is a good sign that you should stop exercising. Check with your health care provider before continuing with normal exercises.   Try to avoid standing for long periods of time. Move your legs often if you must stand in one place for a long time.   Avoid heavy lifting.   Wear low-heeled shoes and practice good posture.   You may continue to have sex unless your health care   provider tells you not to.  Relieving pain and discomfort   Wear a good support bra to relieve breast tenderness.   Take warm sitz baths to soothe any pain or discomfort caused by hemorrhoids. Use hemorrhoid cream if your health care provider approves.   Rest with your legs elevated if you have leg cramps or low back pain.   If you develop varicose veins in your legs, wear support hose. Elevate your feet for 15 minutes, 3-4 times a day. Limit salt in your diet.  Prenatal care   Schedule your prenatal visits by the twelfth week of pregnancy. They are usually scheduled monthly at first, then more often in the last 2 months before delivery.   Write down your questions. Take them to your prenatal visits.   Keep all your prenatal visits as told by your health care provider. This is important.  Safety   Wear your seat belt at all times when driving.   Make a list of emergency phone numbers, including numbers for family, friends, the hospital, and police and fire departments.  General instructions   Ask your health care provider for a referral to a local prenatal education class. Begin classes no later than the beginning of month 6 of your pregnancy.   Ask for help if you have counseling or nutritional needs during pregnancy. Your health care provider can offer advice or refer you to specialists for help  with various needs.   Do not use hot tubs, steam rooms, or saunas.   Do not douche or use tampons or scented sanitary pads.   Do not cross your legs for long periods of time.   Avoid cat litter boxes and soil used by cats. These carry germs that can cause birth defects in the baby and possibly loss of the fetus by miscarriage or stillbirth.   Avoid all smoking, herbs, alcohol, and medicines not prescribed by your health care provider. Chemicals in these products affect the formation and growth of the baby.   Do not use any products that contain nicotine or tobacco, such as cigarettes and e-cigarettes. If you need help quitting, ask your health care provider. You may receive counseling support and other resources to help you quit.   Schedule a dentist appointment. At home, brush your teeth with a soft toothbrush and be gentle when you floss.  Contact a health care provider if:   You have dizziness.   You have mild pelvic cramps, pelvic pressure, or nagging pain in the abdominal area.   You have persistent nausea, vomiting, or diarrhea.   You have a bad smelling vaginal discharge.   You have pain when you urinate.   You notice increased swelling in your face, hands, legs, or ankles.   You are exposed to fifth disease or chickenpox.   You are exposed to German measles (rubella) and have never had it.  Get help right away if:   You have a fever.   You are leaking fluid from your vagina.   You have spotting or bleeding from your vagina.   You have severe abdominal cramping or pain.   You have rapid weight gain or loss.   You vomit blood or material that looks like coffee grounds.   You develop a severe headache.   You have shortness of breath.   You have any kind of trauma, such as from a fall or a car accident.  Summary   The first trimester of pregnancy is from week 1 until   the end of week 13 (months 1 through 3).   Your body goes through many changes during pregnancy. The changes vary from  woman to woman.   You will have routine prenatal visits. During those visits, your health care provider will examine you, discuss any test results you may have, and talk with you about how you are feeling.  This information is not intended to replace advice given to you by your health care provider. Make sure you discuss any questions you have with your health care provider.  Document Released: 07/22/2001 Document Revised: 07/09/2016 Document Reviewed: 07/09/2016  Elsevier Interactive Patient Education  2019 [employer]

## 2018-12-30 NOTE — MAU Provider Note (Signed)
Chief Complaint: Abdominal Pain and Vaginal Discharge   First Provider Initiated Contact with Patient 12/30/18 2134        SUBJECTIVE  HPI: Lauren Costa is a 23 y.o. G3P1011 at [redacted]w[redacted]d by LMP who presents to maternity admissions reporting pelvic cramping and vaginal discharge with odor.  No bleeding. . She denies vaginal bleeding, vaginal itching/burning, urinary symptoms, h/a, dizziness, n/v, or fever/chills.    States LMP is sure.  Had 4-5 months of regular periods before missing one   Has not had OB care yet.  Went to Dr Gaynell Face before and has not gotten a new provider yet.  RN Note: Few days ago I found out I was pregnant. LMP 11/22/18. Having some abd cramping and having white vag d/c with "funny" odor. NO itching. No bleeding  Past Medical History:  Diagnosis Date  . Anxiety    as a child  . Bilateral breast lump 03/19/2016  . Chronic kidney disease    kidney infection  . Headache    Past Surgical History:  Procedure Laterality Date  . DENTAL SURGERY     Social History   Socioeconomic History  . Marital status: Single    Spouse name: Not on file  . Number of children: Not on file  . Years of education: Not on file  . Highest education level: Not on file  Occupational History  . Not on file  Social Needs  . Financial resource strain: Not on file  . Food insecurity:    Worry: Not on file    Inability: Not on file  . Transportation needs:    Medical: Not on file    Non-medical: Not on file  Tobacco Use  . Smoking status: Former Smoker    Last attempt to quit: 04/19/2016    Years since quitting: 2.6  . Smokeless tobacco: Former Engineer, water and Sexual Activity  . Alcohol use: No  . Drug use: No  . Sexual activity: Yes    Partners: Male    Birth control/protection: None  Lifestyle  . Physical activity:    Days per week: Not on file    Minutes per session: Not on file  . Stress: Not on file  Relationships  . Social connections:    Talks on phone: Not on  file    Gets together: Not on file    Attends religious service: Not on file    Active member of club or organization: Not on file    Attends meetings of clubs or organizations: Not on file    Relationship status: Not on file  . Intimate partner violence:    Fear of current or ex partner: Not on file    Emotionally abused: Not on file    Physically abused: Not on file    Forced sexual activity: Not on file  Other Topics Concern  . Not on file  Social History Narrative  . Not on file   No current facility-administered medications on file prior to encounter.    Current Outpatient Medications on File Prior to Encounter  Medication Sig Dispense Refill  . metroNIDAZOLE (FLAGYL) 500 MG tablet Take 1 tablet (500 mg total) by mouth 2 (two) times daily. 14 tablet 0  . Prenatal Vit-Fe Fumarate-FA (PRENATAL MULTIVITAMIN) TABS tablet Take 1 tablet by mouth daily.     . promethazine (PHENERGAN) 25 MG suppository Place 1 suppository (25 mg total) rectally every 6 (six) hours as needed for nausea or vomiting. Use vaginally and not  rectally 12 each 3   Allergies  Allergen Reactions  . Peanut-Containing Drug Products Hives    I have reviewed patient's Past Medical Hx, Surgical Hx, Family Hx, Social Hx, medications and allergies.   ROS:  Review of Systems  Constitutional: Negative for chills and fever.  Gastrointestinal: Positive for abdominal pain. Negative for constipation, diarrhea and nausea.  Genitourinary: Positive for pelvic pain and vaginal discharge. Negative for vaginal bleeding.   Review of Systems  Other systems negative   Physical Exam  Patient Vitals for the past 24 hrs:  BP Temp Pulse Resp Height Weight  12/30/18 1930 (!) 128/56 - 74 - - -  12/30/18 1926 - 98.4 F (36.9 C) - 18 5\' 5"  (1.651 m) 101.6 kg   Constitutional: Well-developed, well-nourished female in no acute distress.  Cardiovascular: normal rate Respiratory: normal effort GI: Abd soft, non-tender. Pos BS x  4 MS: Extremities nontender, no edema, normal ROM Neurologic: Alert and oriented x 4.  GU: Neg CVAT.  PELVIC EXAM: Cervix pink, visually closed, without lesion, small white creamy discharge, vaginal walls and external genitalia normal Bimanual exam: Cervix 0/long/high, firm, anterior, neg CMT, uterus slightly tender, slightly enlarged, adnexa without tenderness, enlargement, or mass   LAB RESULTS Results for orders placed or performed during the hospital encounter of 12/30/18 (from the past 24 hour(s))  Urinalysis, Routine w reflex microscopic     Status: None   Collection Time: 12/30/18  7:32 PM  Result Value Ref Range   Color, Urine YELLOW YELLOW   APPearance CLEAR CLEAR   Specific Gravity, Urine 1.025 1.005 - 1.030   pH 6.0 5.0 - 8.0   Glucose, UA NEGATIVE NEGATIVE mg/dL   Hgb urine dipstick NEGATIVE NEGATIVE   Bilirubin Urine NEGATIVE NEGATIVE   Ketones, ur NEGATIVE NEGATIVE mg/dL   Protein, ur NEGATIVE NEGATIVE mg/dL   Nitrite NEGATIVE NEGATIVE   Leukocytes,Ua NEGATIVE NEGATIVE  Pregnancy, urine POC     Status: Abnormal   Collection Time: 12/30/18  8:14 PM  Result Value Ref Range   Preg Test, Ur POSITIVE (A) NEGATIVE  CBC     Status: Abnormal   Collection Time: 12/30/18  8:50 PM  Result Value Ref Range   WBC 8.1 4.0 - 10.5 K/uL   RBC 4.50 3.87 - 5.11 MIL/uL   Hemoglobin 11.7 (L) 12.0 - 15.0 g/dL   HCT 03.1 59.4 - 58.5 %   MCV 81.8 80.0 - 100.0 fL   MCH 26.0 26.0 - 34.0 pg   MCHC 31.8 30.0 - 36.0 g/dL   RDW 92.9 24.4 - 62.8 %   Platelets 249 150 - 400 K/uL   nRBC 0.0 0.0 - 0.2 %  Comprehensive metabolic panel     Status: None   Collection Time: 12/30/18  8:50 PM  Result Value Ref Range   Sodium 138 135 - 145 mmol/L   Potassium 4.0 3.5 - 5.1 mmol/L   Chloride 104 98 - 111 mmol/L   CO2 26 22 - 32 mmol/L   Glucose, Bld 86 70 - 99 mg/dL   BUN 8 6 - 20 mg/dL   Creatinine, Ser 6.38 0.44 - 1.00 mg/dL   Calcium 9.3 8.9 - 17.7 mg/dL   Total Protein 7.0 6.5 - 8.1 g/dL    Albumin 3.7 3.5 - 5.0 g/dL   AST 15 15 - 41 U/L   ALT 14 0 - 44 U/L   Alkaline Phosphatase 75 38 - 126 U/L   Total Bilirubin 0.5 0.3 - 1.2 mg/dL  GFR calc non Af Amer >60 >60 mL/min   GFR calc Af Amer >60 >60 mL/min   Anion gap 8 5 - 15  hCG, quantitative, pregnancy     Status: Abnormal   Collection Time: 12/30/18  8:50 PM  Result Value Ref Range   hCG, Beta Chain, Quant, S 3,732 (H) <5 mIU/mL  Wet prep, genital     Status: Abnormal   Collection Time: 12/30/18  9:44 PM  Result Value Ref Range   Yeast Wet Prep HPF POC NONE SEEN NONE SEEN   Trich, Wet Prep NONE SEEN NONE SEEN   Clue Cells Wet Prep HPF POC NONE SEEN NONE SEEN   WBC, Wet Prep HPF POC MODERATE (A) NONE SEEN   Sperm NONE SEEN       IMAGING [place] Ob Comp Less 14 Wks  Result Date: 12/30/2018 CLINICAL DATA:  Initial evaluation for acute cramping, early pregnancy. EXAM: OBSTETRIC <14 WK US AND TRANSVAGINAL OB US TECHNIQUE: Both transabdominal and transvaginal ultrasound examinations were performed for complete evaluation of the gestation as well as the maternal uterus, adnexal regions, and pelvic cul-de-sac. Transvaginal technique was performed to assess early pregnancy. COMPARISON:  None available. FINDINGS: Intrauterine gestational sac: Single Yolk sac:  Present Embryo:  Not visualized. Cardiac Activity: N/A Heart Rate: N/A  bpm MSD: 4.7 mm   5 w   1 d Subchorionic hemorrhage: Small subchorionic hemorrhage without mass effect. Maternal uterus/adnexae: Left ovary normal in appearance. 1.4 x 1.2 x 1.4 cm right ovarian corpus luteal cyst noted. Additional 2.9 x 1.8 x 3.0 cm simple right paraovarian cyst noted as well. No free fluid within the pelvis. IMPRESSION: 1. Probable early intrauterine gestational sac with internal yolk sac, but no fetal pole or cardiac activity yet visualized. Estimated gestational age [redacted] weeks and 1 day by mean sac diameter. Recommend follow-up quantitative B-HCG levels and follow-up US in 14 days to confirm  and assess viability. 2. Small perigestational hemorrhage without associated mass effect. 3. 1.4 cm right ovarian corpus luteal cyst, with additional 3 cm simple right paraovarian cyst. Electronically Signed   By: Rise MuBenjamin  McClintock M.D.   On: 12/30/2018 21:34   [place] Ob Transvaginal  Result Date: 12/30/2018 CLINICAL DATA:  Initial evaluation for acute cramping, early pregnancy. EXAM: OBSTETRIC <14 WK US AND TRANSVAGINAL OB US TECHNIQUE: Both transabdominal and transvaginal ultrasound examinations were performed for complete evaluation of the gestation as well as the maternal uterus, adnexal regions, and pelvic cul-de-sac. Transvaginal technique was performed to assess early pregnancy. COMPARISON:  None available. FINDINGS: Intrauterine gestational sac: Single Yolk sac:  Present Embryo:  Not visualized. Cardiac Activity: N/A Heart Rate: N/A  bpm MSD: 4.7 mm   5 w   1 d Subchorionic hemorrhage: Small subchorionic hemorrhage without mass effect. Maternal uterus/adnexae: Left ovary normal in appearance. 1.4 x 1.2 x 1.4 cm right ovarian corpus luteal cyst noted. Additional 2.9 x 1.8 x 3.0 cm simple right paraovarian cyst noted as well. No free fluid within the pelvis. IMPRESSION: 1. Probable early intrauterine gestational sac with internal yolk sac, but no fetal pole or cardiac activity yet visualized. Estimated gestational age [redacted] weeks and 1 day by mean sac diameter. Recommend follow-up quantitative B-HCG levels and follow-up US in 14 days to confirm and assess viability. 2. Small perigestational hemorrhage without associated mass effect. 3. 1.4 cm right ovarian corpus luteal cyst, with additional 3 cm simple right paraovarian cyst. Electronically Signed   By: Rise MuBenjamin  McClintock M.D.   On: 12/30/2018  21:34    MAU Management/MDM: Ordered usual first trimester r/o ectopic labs.   Pelvic exam and cultures done Will check baseline Ultrasound to rule out ectopic.  This bleeding/pain can represent a normal  pregnancy with bleeding, spontaneous abortion or even an ectopic which can be life-threatening.  The process as listed above helps to determine which of these is present.  Reviewed results DIscussed that seeing yolk sac effectively rules out the ectopic pregnancy option Encouraged her to seek [facility], probably at [facility] (she was interested due to seeing Dr Gaynell Face in prior pregnancy)  ASSESSMENT 1. Abdominal pain   2.     Pregnancy of unknown anatomic location 3.     Single intrauterine gestational sac with yolk sac at [redacted]w[redacted]d   PLAN Discharge home Encouraged to seek prenatal care  Pt stable at time of discharge. Encouraged to return here or to other Urgent Care/ED if she develops worsening of symptoms, increase in pain, fever, or other concerning symptoms.    Wynelle Bourgeois CNM, MSN Certified Nurse-Midwife 12/30/2018  10:33 PM

## 2018-12-30 NOTE — MAU Note (Signed)
Few days ago I found out I was pregnant. LMP 11/22/18. Having some abd cramping and having white vag d/c with "funny" odor. NO itching. No bleeding

## 2018-12-31 LAB — GC/CHLAMYDIA PROBE AMP (~~LOC~~) NOT AT ARMC
Chlamydia: NEGATIVE
Neisseria Gonorrhea: NEGATIVE

## 2019-01-11 ENCOUNTER — Other Ambulatory Visit: Payer: Self-pay

## 2019-01-11 ENCOUNTER — Inpatient Hospital Stay (HOSPITAL_COMMUNITY)
Admission: AD | Admit: 2019-01-11 | Discharge: 2019-01-11 | Disposition: A | Discharge status: Home / Self Care | Payer: Medicaid Other | Attending: Obstetrics and Gynecology | Admitting: Obstetrics and Gynecology

## 2019-01-11 ENCOUNTER — Encounter (HOSPITAL_COMMUNITY): Payer: Self-pay | Admitting: Obstetrics and Gynecology

## 2019-01-11 DIAGNOSIS — O210 Mild hyperemesis gravidarum: Secondary | ICD-10-CM | POA: Diagnosis not present

## 2019-01-11 DIAGNOSIS — Z87891 Personal history of nicotine dependence: Secondary | ICD-10-CM | POA: Insufficient documentation

## 2019-01-11 DIAGNOSIS — Z3A01 Less than 8 weeks gestation of pregnancy: Secondary | ICD-10-CM | POA: Diagnosis not present

## 2019-01-11 LAB — URINALYSIS, ROUTINE W REFLEX MICROSCOPIC
Bilirubin Urine: NEGATIVE
Glucose, UA: NEGATIVE mg/dL
Hgb urine dipstick: NEGATIVE
Ketones, ur: 20 mg/dL — AB
Nitrite: NEGATIVE
Protein, ur: 30 mg/dL — AB
Specific Gravity, Urine: 1.021 (ref 1.005–1.030)
pH: 5 (ref 5.0–8.0)

## 2019-01-11 MED ORDER — PROMETHAZINE HCL 25 MG/ML IJ SOLN
25.0000 mg | Freq: Once | INTRAVENOUS | Status: AC
  Administered 2019-01-11: 25 mg via INTRAVENOUS
  Filled 2019-01-11 (×2): qty 1

## 2019-01-11 MED ORDER — PROMETHAZINE HCL 12.5 MG PO TABS: 12.5000 mg | ORAL_TABLET | Freq: Four times a day (QID) | ORAL | 0 refills | Status: DC | PRN

## 2019-01-11 MED ORDER — M.V.I. ADULT IV INJ
Freq: Once | INTRAVENOUS | Status: AC
  Administered 2019-01-11: 15:00:00 via INTRAVENOUS
  Filled 2019-01-11: qty 1000

## 2019-01-11 NOTE — MAU Provider Note (Signed)
History     CSN: [id number]  Arrival date and time: 01/11/19 1107   First Provider Initiated Contact with Patient 01/11/19 1215      Chief Complaint  Patient presents with  . Emesis   HPI  Ms.  Lauren Costa is a 23 y.o. year old G60P1011 female at [redacted]w[redacted]d weeks gestation who presents to MAU reporting N/V since eating hibachi style food 3 days ago, lower abdominal pain/cramping (rated 8/10), and now diarrhea. She states she has not had any morning sickness during this pregnancy. She has no anti-emetic medications at home. She has an appointment scheduled for her NOB appt with [facility] in 3 wks.   Past Medical History:  Diagnosis Date  . Anxiety    as a child  . Bilateral breast lump 03/19/2016  . Chronic kidney disease    kidney infection    Past Surgical History:  Procedure Laterality Date  . DENTAL SURGERY      Family History  Problem Relation Age of Onset  . Cancer Maternal Aunt   . Cancer Maternal Uncle   . Alcohol abuse Neg Hx   . Arthritis Neg Hx   . Asthma Neg Hx   . Birth defects Neg Hx   . COPD Neg Hx   . Depression Neg Hx   . Diabetes Neg Hx   . Drug abuse Neg Hx   . Early death Neg Hx   . Hearing loss Neg Hx   . Heart disease Neg Hx   . Hyperlipidemia Neg Hx   . Hypertension Neg Hx   . Kidney disease Neg Hx   . Learning disabilities Neg Hx   . Mental illness Neg Hx   . Mental retardation Neg Hx   . Miscarriages / Stillbirths Neg Hx   . Stroke Neg Hx   . Vision loss Neg Hx   . Varicose Veins Neg Hx     Social History   Tobacco Use  . Smoking status: Former Smoker    Last attempt to quit: 04/19/2016    Years since quitting: 2.7  . Smokeless tobacco: Former Engineer, water Use Topics  . Alcohol use: No  . Drug use: No    Allergies:  Allergies  Allergen Reactions  . Peanut-Containing Drug Products Hives    Medications Prior to Admission  Medication Sig Dispense Refill Last Dose  . metroNIDAZOLE (FLAGYL) 500 MG tablet Take 1 tablet (500  mg total) by mouth 2 (two) times daily. 14 tablet 0   . Prenatal Vit-Fe Fumarate-FA (PRENATAL MULTIVITAMIN) TABS tablet Take 1 tablet by mouth daily.    Past Week at Unknown time  . promethazine (PHENERGAN) 25 MG suppository Place 1 suppository (25 mg total) rectally every 6 (six) hours as needed for nausea or vomiting. Use vaginally and not rectally 12 each 3     Review of Systems  Constitutional: Negative.   HENT: Negative.   Eyes: Negative.   Respiratory: Negative.   Cardiovascular: Negative.   Gastrointestinal: Positive for nausea and vomiting.  Endocrine: Negative.   Genitourinary: Negative.   Musculoskeletal: Negative.   Skin: Negative.   Allergic/Immunologic: Negative.   Neurological: Negative.   Hematological: Negative.   Psychiatric/Behavioral: Negative.    Physical Exam   Blood pressure (!) 145/87, pulse 77, temperature 98.4 F (36.9 C), temperature source Oral, resp. rate 18, weight 97.5 kg, last menstrual period 11/22/2018, SpO2 100 %, unknown if currently breastfeeding.  Physical Exam  Nursing note and vitals reviewed. Constitutional: She is oriented  to person, place, and time. She appears well-developed and well-nourished.  HENT:  Head: Normocephalic and atraumatic.  Eyes: Pupils are equal, round, and reactive to light.  Neck: Normal range of motion.  Cardiovascular: Normal rate.  Respiratory: Effort normal.  GI: Soft.  Genitourinary:    Genitourinary Comments: Pelvic deferred   Musculoskeletal: Normal range of motion.  Neurological: She is alert and oriented to person, place, and time.  Skin: Skin is warm and dry.  Psychiatric: She has a normal mood and affect. Her behavior is normal. Judgment and thought content normal.    MAU Course  Procedures  MDM IVFs: Phenergan 25 mg in LR 1000 ml @ 999 ml/hr; followed by MVI in LR 1000 ml @ 500 ml/hr -- resolved nausea/vomiting PO Challenge -- patient tolerated well  Results for orders placed or performed  during the hospital encounter of 01/11/19 (from the past 24 hour(s))  Urinalysis, Routine w reflex microscopic     Status: Abnormal   Collection Time: 01/11/19 11:22 AM  Result Value Ref Range   Color, Urine YELLOW YELLOW   APPearance HAZY (A) CLEAR   Specific Gravity, Urine 1.021 1.005 - 1.030   pH 5.0 5.0 - 8.0   Glucose, UA NEGATIVE NEGATIVE mg/dL   Hgb urine dipstick NEGATIVE NEGATIVE   Bilirubin Urine NEGATIVE NEGATIVE   Ketones, ur 20 (A) NEGATIVE mg/dL   Protein, ur 30 (A) NEGATIVE mg/dL   Nitrite NEGATIVE NEGATIVE   Leukocytes,Ua SMALL (A) NEGATIVE   RBC / HPF 0-5 0 - 5 RBC/hpf   WBC, UA 0-5 0 - 5 WBC/hpf   Bacteria, UA RARE (A) NONE SEEN   Squamous Epithelial / LPF 11-20 0 - 5   Mucus PRESENT     Assessment and Plan  Morning sickness - Plan: Discharge patient - Rx for Phenergan 12.5 mg po or vaginally every 6 hrs prn N/V - Information provided on N/V - Advised to call [facility] to get NOB appt scheduled - Patient verbalized an understanding of the plan of care and agrees.    Raelyn Moraolitta Rudransh Bellanca, MSN, CNM 01/11/2019, 12:15 PM

## 2019-01-11 NOTE — Discharge Instructions (Signed)

## 2019-01-11 NOTE — MAU Note (Addendum)
Pt says she keeps throwing up and hasn't eaten in 3 days. Has had diarrhea also. Could possibly be from something she ate, happened after she ate and has continued.  Having lower abdominal cramping 8/10. No bleeding.

## 2019-06-23 IMAGING — US OBSTETRIC <14 WK ULTRASOUND
1 series · 15 of 28 positions shown · non-contrast
Comparison: None available.

CLINICAL DATA: Initial evaluation for acute cramping, early
pregnancy.

EXAM:
OBSTETRIC <14 WK US AND TRANSVAGINAL OB US
TECHNIQUE: Both transabdominal and transvaginal ultrasound examinations were
performed for complete evaluation of the gestation as well as the
maternal uterus, adnexal regions, and pelvic cul-de-sac.
Transvaginal technique was performed to assess early pregnancy.

[Series 1: obstetric <14 wk ultrasound · 15 of 66 slices shown]
[im 1/66]
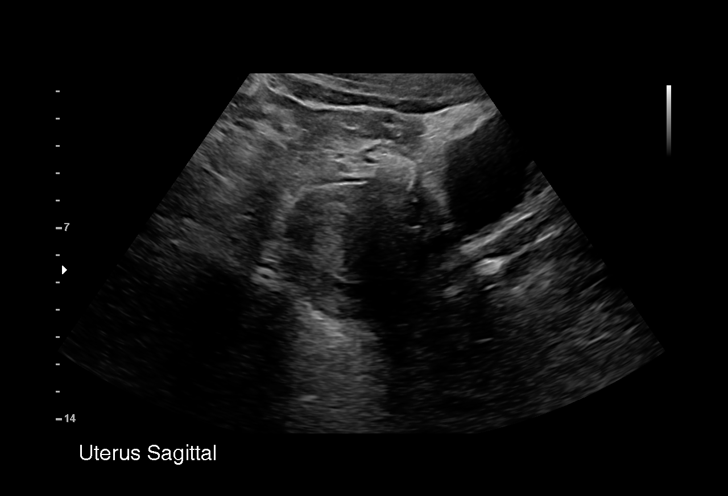
[im 5/66]
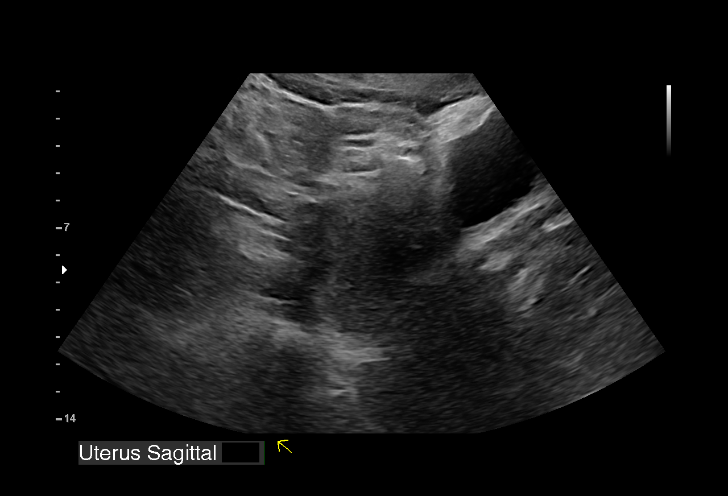
[im 10/66]
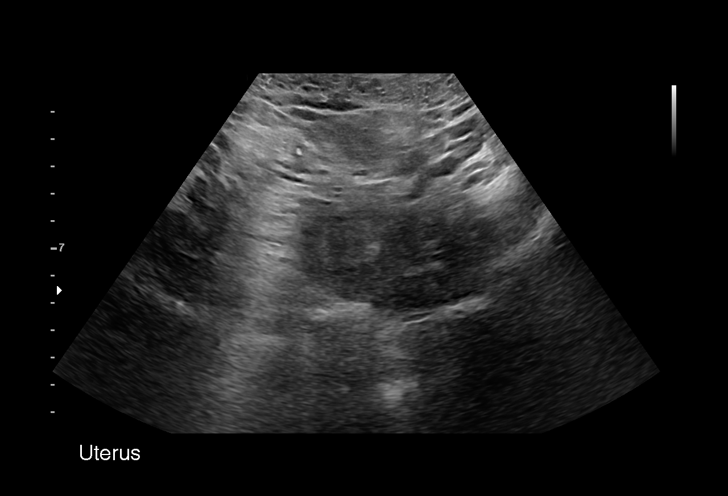
[im 15/66]
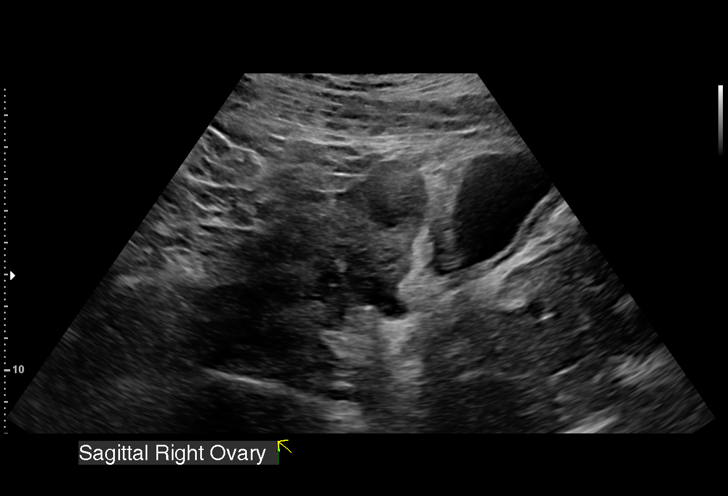
[im 20/66]
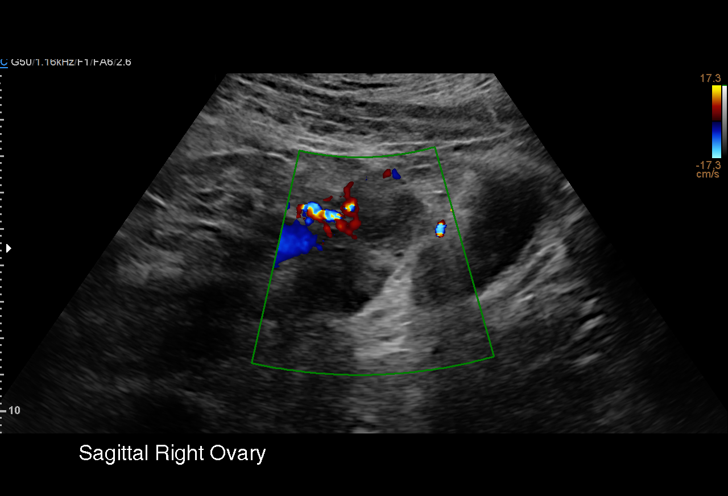
[im 25/66]
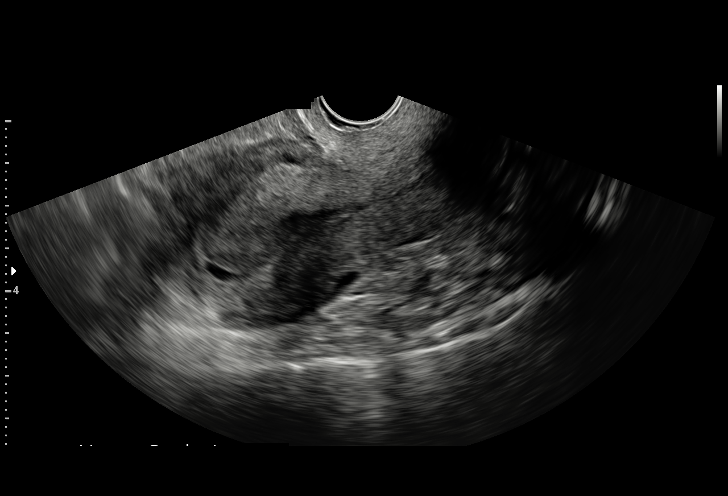
[im 29/66]
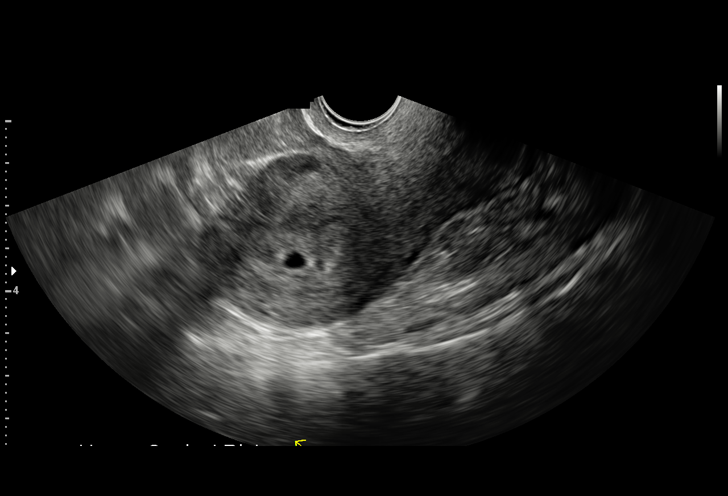
[im 34/66]
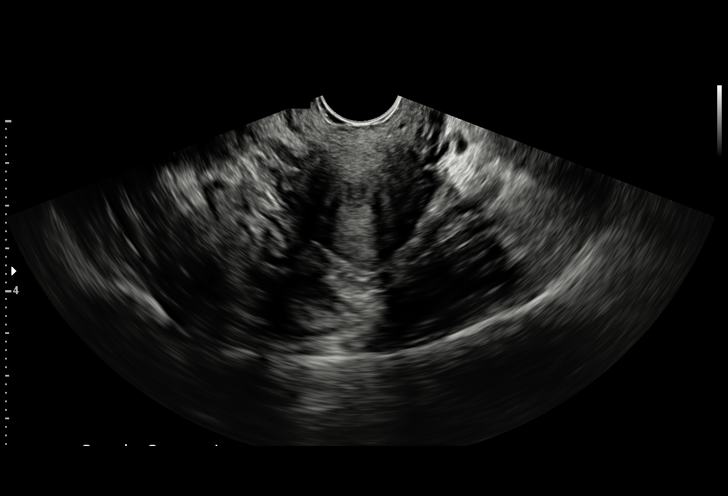
[im 37/66]
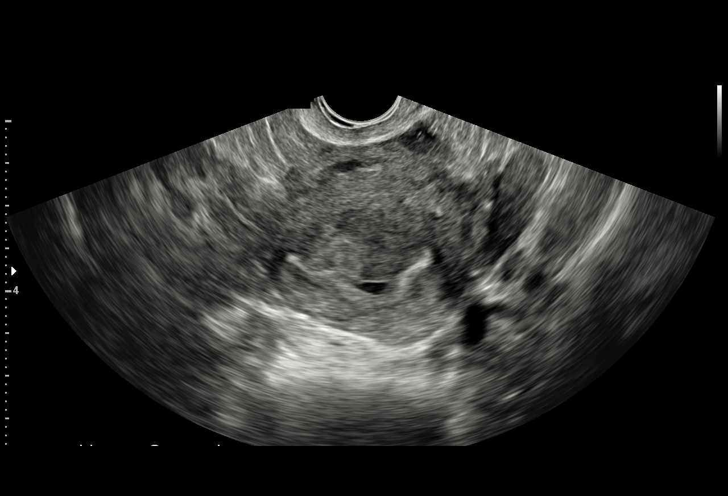
[im 41/66]
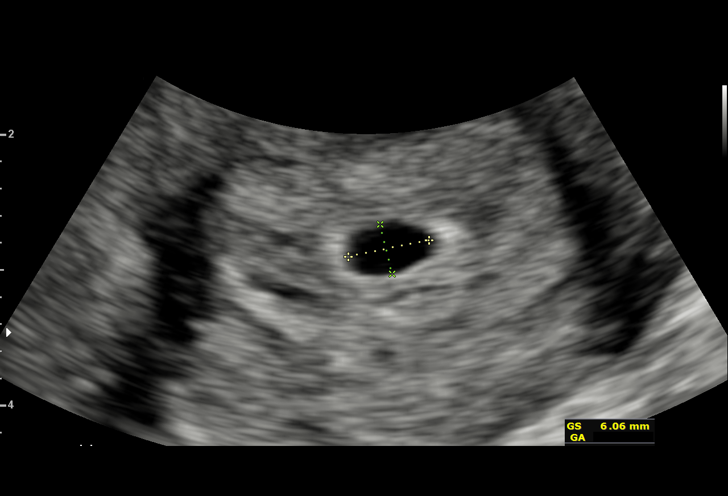
[im 46/66]
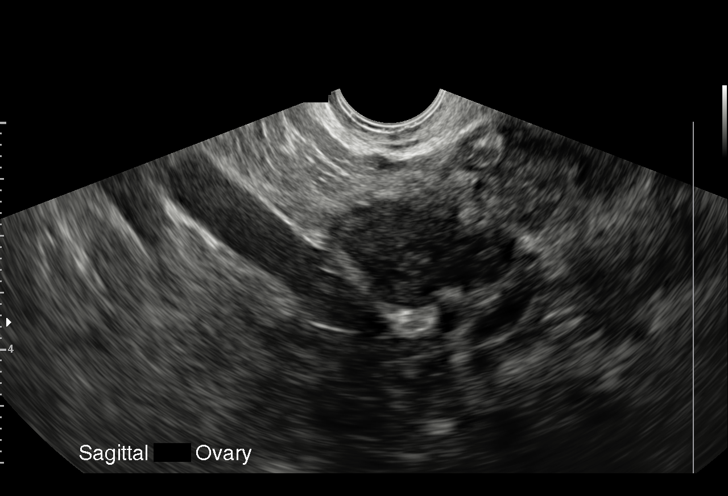
[im 51/66]
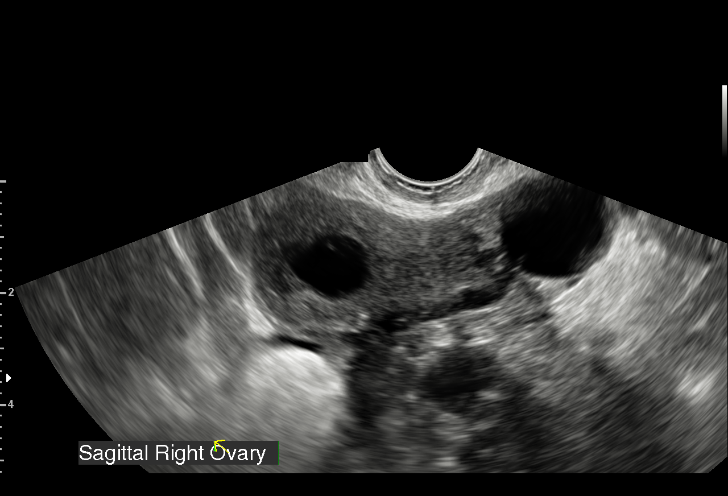
[im 56/66]
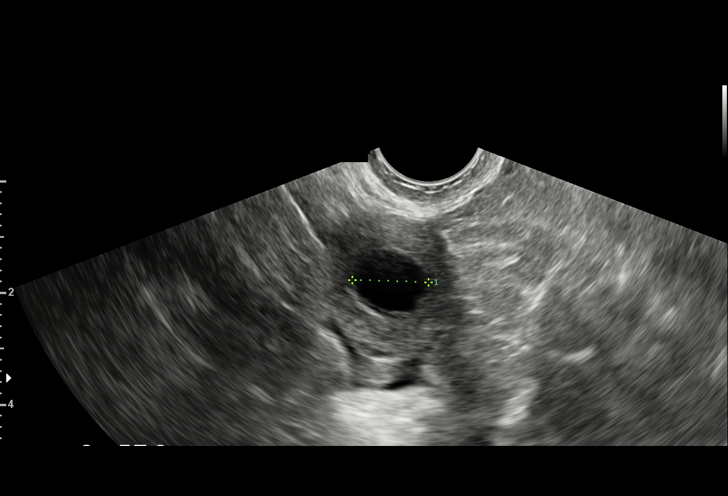
[im 61/66]
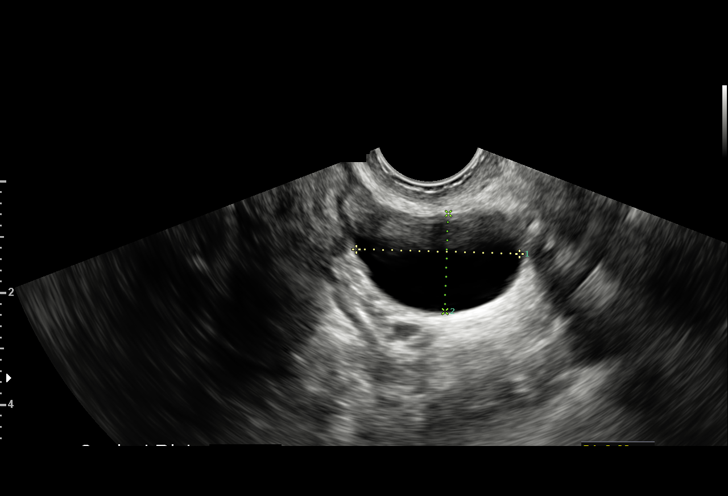
[im 66/66]
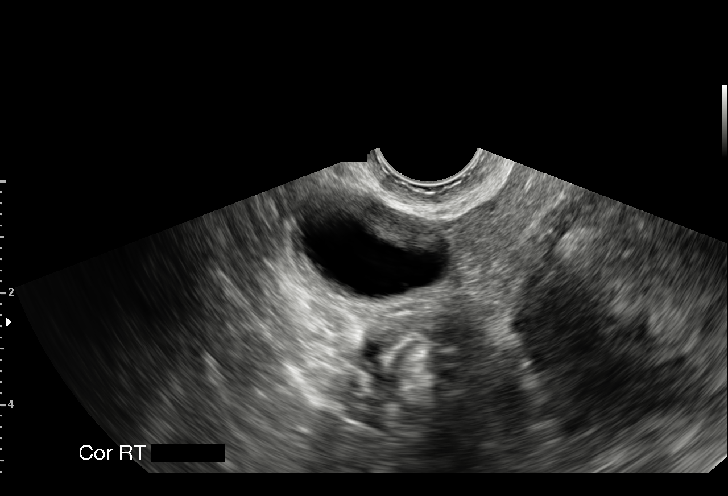

[15 of 28 positions shown; findings below may reference images not displayed]

FINDINGS: Intrauterine gestational sac: Single

Yolk sac:  Present

Embryo:  Not visualized.

Cardiac Activity: N/A

Heart Rate: N/A  bpm

MSD: 4.7 mm   5 w   1 d

Subchorionic hemorrhage: Small subchorionic hemorrhage without mass
effect.

Maternal uterus/adnexae: Left ovary normal in appearance. 1.4 x
x 1.4 cm right ovarian corpus luteal cyst noted. Additional 2.9 x
1.8 x 3.0 cm simple right paraovarian cyst noted as well. No free
fluid within the pelvis.
IMPRESSION: 1. Probable early intrauterine gestational sac with internal yolk
sac, but no fetal pole or cardiac activity yet visualized. Estimated
gestational age 5 weeks and 1 day by mean sac diameter. Recommend
follow-up quantitative B-HCG levels and follow-up US in 14 days to
confirm and assess viability.
2. Small perigestational hemorrhage without associated mass effect.
3. 1.4 cm right ovarian corpus luteal cyst, with additional 3 cm
simple right paraovarian cyst.

## 2019-06-29 ENCOUNTER — Ambulatory Visit: Payer: Medicaid Other | Admitting: Family Medicine

## 2019-10-05 ENCOUNTER — Encounter (HOSPITAL_COMMUNITY): Payer: Self-pay | Admitting: Emergency Medicine

## 2019-10-05 ENCOUNTER — Ambulatory Visit (HOSPITAL_COMMUNITY)
Admission: EM | Admit: 2019-10-05 | Discharge: 2019-10-05 | Disposition: A | Discharge status: Home / Self Care | Payer: Medicaid Other | Attending: Family Medicine | Admitting: Family Medicine

## 2019-10-05 ENCOUNTER — Other Ambulatory Visit: Payer: Self-pay

## 2019-10-05 DIAGNOSIS — Z202 Contact with and (suspected) exposure to infections with a predominantly sexual mode of transmission: Secondary | ICD-10-CM | POA: Diagnosis not present

## 2019-10-05 DIAGNOSIS — N898 Other specified noninflammatory disorders of vagina: Secondary | ICD-10-CM | POA: Diagnosis not present

## 2019-10-05 DIAGNOSIS — Z3202 Encounter for pregnancy test, result negative: Secondary | ICD-10-CM

## 2019-10-05 LAB — POCT URINALYSIS DIP (DEVICE)
Bilirubin Urine: NEGATIVE
Glucose, UA: NEGATIVE mg/dL
Hgb urine dipstick: NEGATIVE
Ketones, ur: NEGATIVE mg/dL
Leukocytes,Ua: NEGATIVE
Nitrite: NEGATIVE
Protein, ur: NEGATIVE mg/dL
Specific Gravity, Urine: >=1.03 (ref 1.005–1.030)
Urobilinogen, UA: 0.2 mg/dL (ref 0.0–1.0)
pH: 5.5 (ref 5.0–8.0)

## 2019-10-05 LAB — POCT PREGNANCY, URINE: Preg Test, Ur: NEGATIVE

## 2019-10-05 LAB — POC URINE PREG, ED: Preg Test, Ur: NEGATIVE

## 2019-10-05 MED ORDER — DOXYCYCLINE HYCLATE 100 MG PO CAPS: 100.0000 mg | ORAL_CAPSULE | Freq: Two times a day (BID) | ORAL | 0 refills | Status: DC

## 2019-10-05 NOTE — Discharge Instructions (Addendum)
You have been given the following today for treatment of suspected chlamydia:  Please pick up your prescription for doxycycline 100 mg and begin taking twice daily for the next seven (7) days.  Even though we have treated you today, we have sent testing for sexually transmitted infections. We will notify you of any positive results once they are received. If required, we will prescribe any medications you might need.  Please refrain from all sexual activity for at least the next seven days.  

## 2019-10-05 NOTE — ED Triage Notes (Signed)
Sexual partner tested positive for chlamydia.  Patient has vaginal irritation.  Patient has vaginal discharge, period just ended.  Slight pain with urination.  Denies pain in abdomen.  Patient complains of some pain in lower back

## 2019-10-08 NOTE — ED Provider Notes (Signed)
[facility]   [id number] 10/05/19 Arrival Time: 1843  ASSESSMENT & PLAN:  1. Possible exposure to STD   2. Vaginal irritation     Declines empiric gonorrhea treatment.   Discharge Instructions     You have been given the following today for treatment of suspected chlamydia.  Please pick up your prescription for doxycycline 100 mg and begin taking twice daily for the next seven (7) days.  Even though we have treated you today, we have sent testing for sexually transmitted infections. We will notify you of any positive results once they are received. If required, we will prescribe any medications you might need.  Please refrain from all sexual activity for at least the next seven days.     Without s/s of PID.  Labs Reviewed  POCT URINALYSIS DIP (DEVICE)  POCT PREGNANCY, URINE  POC URINE PREG, ED  CERVICOVAGINAL ANCILLARY ONLY    Pending: Unresulted Labs (From admission, onward)   None       Will notify of any positive results. Instructed to refrain from sexual activity for at least seven days.  Reviewed expectations re: course of current medical issues. Questions answered. Outlined signs and symptoms indicating need for more acute intervention. Patient verbalized understanding. After Visit Summary given.   SUBJECTIVE:  Lauren Costa is a 24 y.o. female who reports a sexual partner testing positive for Chlamydia. Patient reports vaginal irritation; unsure of exact duration. Mild irritation with urination; without freq. No specific aggravating or alleviating factors reported.  Afebrile. No abdominal or pelvic pain. Normal PO intake wihout n/v. No genital rashes or lesions.  OTC treatment: none reported. History of STI: none reported.  Patient's last menstrual period was 09/24/2019.   OBJECTIVE:  Vitals:   10/05/19 1915  BP: 118/76  Pulse: 84  Resp: 18  Temp: 98.3 F (36.8 C)  TempSrc: Oral  SpO2: 98%     General appearance: alert,  cooperative, appears stated age and no distress Lungs: unlabored respirations; speaks full sentences without difficulty Back: no CVA tenderness; FROM at waist Abdomen: soft, non-tender GU: deferred Skin: warm and dry Psychological: alert and cooperative; normal mood and affect.  Results for orders placed or performed during the hospital encounter of 10/05/19  POCT urinalysis dip (device)  Result Value Ref Range   Glucose, UA NEGATIVE NEGATIVE mg/dL   Bilirubin Urine NEGATIVE NEGATIVE   Ketones, ur NEGATIVE NEGATIVE mg/dL   Specific Gravity, Urine >=1.030 1.005 - 1.030   Hgb urine dipstick NEGATIVE NEGATIVE   pH 5.5 5.0 - 8.0   Protein, ur NEGATIVE NEGATIVE mg/dL   Urobilinogen, UA 0.2 0.0 - 1.0 mg/dL   Nitrite NEGATIVE NEGATIVE   Leukocytes,Ua NEGATIVE NEGATIVE  Pregnancy, urine POC  Result Value Ref Range   Preg Test, Ur NEGATIVE NEGATIVE  POC urine preg, ED (not at [facility])  Result Value Ref Range   Preg Test, Ur NEGATIVE NEGATIVE    Labs Reviewed  POCT URINALYSIS DIP (DEVICE)  POCT PREGNANCY, URINE  POC URINE PREG, ED  CERVICOVAGINAL ANCILLARY ONLY    Allergies  Allergen Reactions  . Peanut-Containing Drug Products Hives    Past Medical History:  Diagnosis Date  . Anxiety    as a child  . Bilateral breast lump 03/19/2016  . Chronic kidney disease    kidney infection   Family History  Problem Relation Age of Onset  . Cancer Maternal Aunt   . Cancer Maternal Uncle   . Alcohol abuse Neg Hx   .  Arthritis Neg Hx   . Asthma Neg Hx   . Birth defects Neg Hx   . COPD Neg Hx   . Depression Neg Hx   . Diabetes Neg Hx   . Drug abuse Neg Hx   . Early death Neg Hx   . Hearing loss Neg Hx   . Heart disease Neg Hx   . Hyperlipidemia Neg Hx   . Hypertension Neg Hx   . Kidney disease Neg Hx   . Learning disabilities Neg Hx   . Mental illness Neg Hx   . Mental retardation Neg Hx   . Miscarriages / Stillbirths Neg Hx   . Stroke Neg Hx   . Vision loss Neg Hx   .  Varicose Veins Neg Hx    Social History   Socioeconomic History  . Marital status: Single    Spouse name: Not on file  . Number of children: Not on file  . Years of education: Not on file  . Highest education level: Not on file  Occupational History  . Not on file  Tobacco Use  . Smoking status: Current Some Day Smoker    Last attempt to quit: 04/19/2016    Years since quitting: 3.4  . Smokeless tobacco: Former Network engineer and Sexual Activity  . Alcohol use: No  . Drug use: Yes    Types: Marijuana  . Sexual activity: Yes    Partners: Male    Birth control/protection: None  Other Topics Concern  . Not on file  Social History Narrative  . Not on file   Social Determinants of Health   Financial Resource Strain:   . Difficulty of Paying Living Expenses: Not on file  Food Insecurity:   . Worried About Charity fundraiser in the Last Year: Not on file  . Ran Out of Food in the Last Year: Not on file  Transportation Needs:   . Lack of Transportation (Medical): Not on file  . Lack of Transportation (Non-Medical): Not on file  Physical Activity:   . Days of Exercise per Week: Not on file  . Minutes of Exercise per Session: Not on file  Stress:   . Feeling of Stress : Not on file  Social Connections:   . Frequency of Communication with Friends and Family: Not on file  . Frequency of Social Gatherings with Friends and Family: Not on file  . Attends Religious Services: Not on file  . Active Member of Clubs or Organizations: Not on file  . Attends Archivist Meetings: Not on file  . Marital Status: Not on file  Intimate Partner Violence:   . Fear of Current or Ex-Partner: Not on file  . Emotionally Abused: Not on file  . Physically Abused: Not on file  . Sexually Abused: Not on file          Vanessa Kick, MD 10/08/19 [phone]

## 2019-10-10 ENCOUNTER — Telehealth (HOSPITAL_COMMUNITY): Payer: Self-pay | Admitting: Emergency Medicine

## 2019-10-10 LAB — CERVICOVAGINAL ANCILLARY ONLY
Bacterial vaginitis: POSITIVE — AB
Candida vaginitis: NEGATIVE
Chlamydia: NEGATIVE
Neisseria Gonorrhea: NEGATIVE
Trichomonas: NEGATIVE

## 2019-10-10 MED ORDER — METRONIDAZOLE 500 MG PO TABS: 500.0000 mg | ORAL_TABLET | Freq: Two times a day (BID) | ORAL | 0 refills | Status: AC

## 2019-10-10 NOTE — Telephone Encounter (Signed)
Bacterial vaginosis is positive. Pt needs treatment. Flagyl 500 mg BID x 7 days #14 no refills sent to patients pharmacy of choice.    Patient contacted by phone and made aware of    results. Pt verbalized understanding and had all questions answered.    

## 2019-10-20 ENCOUNTER — Ambulatory Visit (HOSPITAL_COMMUNITY)
Admission: EM | Admit: 2019-10-20 | Discharge: 2019-10-20 | Disposition: A | Discharge status: Home / Self Care | Payer: Medicaid Other | Attending: Family Medicine | Admitting: Family Medicine

## 2019-10-20 ENCOUNTER — Other Ambulatory Visit: Payer: Self-pay

## 2019-10-20 ENCOUNTER — Encounter (HOSPITAL_COMMUNITY): Payer: Self-pay

## 2019-10-20 ENCOUNTER — Ambulatory Visit (INDEPENDENT_AMBULATORY_CARE_PROVIDER_SITE_OTHER): Payer: Medicaid Other

## 2019-10-20 DIAGNOSIS — S99921A Unspecified injury of right foot, initial encounter: Secondary | ICD-10-CM | POA: Diagnosis not present

## 2019-10-20 DIAGNOSIS — M79672 Pain in left foot: Secondary | ICD-10-CM

## 2019-10-20 MED ORDER — CYCLOBENZAPRINE HCL 10 MG PO TABS: 10.0000 mg | ORAL_TABLET | Freq: Two times a day (BID) | ORAL | 0 refills | Status: DC | PRN

## 2019-10-20 MED ORDER — MELOXICAM 15 MG PO TABS: 15.0000 mg | ORAL_TABLET | Freq: Every day | ORAL | 1 refills | Status: DC

## 2019-10-20 NOTE — Discharge Instructions (Signed)
Take medication as directed.

## 2019-10-20 NOTE — ED Provider Notes (Signed)
[facility]    CSN: [id number] Arrival date & time: 10/20/19  1759      History   Chief Complaint Chief Complaint  Patient presents with  . Toe Injury    HPI Lauren Costa is a 24 y.o. female.   Medical history significant for morbid obesity and anxiety.  HPI  Patient presents today with left foot and left third toe pain following an injury in which she fell with for weight landing on her foot while playing with her children yesterday.  She endorses some swelling upon awakening today.  She denies any previous injury or fractures involving her left foot or left toe.  She has not applied ice although has taken some ibuprofen with improvement of pain.  She has her foot placed in a postop shoe today which is allowing her to walk without pain.  She is negative for any bruising involving her foot.  She denies any ankle pain.  She endorses generalized achiness involving her back and arms following the fall.   Past Medical History:  Diagnosis Date  . Anxiety    as a child  . Bilateral breast lump 03/19/2016  . Chronic kidney disease    kidney infection    Patient Active Problem List   Diagnosis Date Noted  . Morning sickness 01/11/2019  . Intrauterine pregnancy 04/19/2018  . Nausea/vomiting in pregnancy 04/19/2018  . Bacterial vaginitis 04/19/2018  . Mild tetrahydrocannabinol (THC) abuse 07/17/2016  . Supervision of normal first pregnancy 03/19/2016  . Bilateral breast lump 03/19/2016    Past Surgical History:  Procedure Laterality Date  . DENTAL SURGERY      OB History    Gravida  3   Para  1   Term  1   Preterm      AB  1   Living  1     SAB  0   TAB  1   Ectopic      Multiple  0   Live Births  1            Home Medications    Prior to Admission medications   Medication Sig Start Date End Date Taking? Authorizing Provider  doxycycline (VIBRAMYCIN) 100 MG capsule Take 1 capsule (100 mg total) by mouth 2 (two) times daily. 10/05/19    Mardella Layman, MD  promethazine (PHENERGAN) 12.5 MG tablet Take 1 tablet (12.5 mg total) by mouth every 6 (six) hours as needed for up to 30 days for nausea or vomiting. Can use vaginally if unable to keep anything down 01/11/19 10/05/19  Raelyn Mora, CNM    Family History Family History  Problem Relation Age of Onset  . Cancer Maternal Aunt   . Cancer Maternal Uncle   . Alcohol abuse Neg Hx   . Arthritis Neg Hx   . Asthma Neg Hx   . Birth defects Neg Hx   . COPD Neg Hx   . Depression Neg Hx   . Diabetes Neg Hx   . Drug abuse Neg Hx   . Early death Neg Hx   . Hearing loss Neg Hx   . Heart disease Neg Hx   . Hyperlipidemia Neg Hx   . Hypertension Neg Hx   . Kidney disease Neg Hx   . Learning disabilities Neg Hx   . Mental illness Neg Hx   . Mental retardation Neg Hx   . Miscarriages / Stillbirths Neg Hx   . Stroke Neg Hx   . Vision  loss Neg Hx   . Varicose Veins Neg Hx     Social History Social History   Tobacco Use  . Smoking status: Current Some Day Smoker    Last attempt to quit: 04/19/2016    Years since quitting: 3.5  . Smokeless tobacco: Former Network engineer Use Topics  . Alcohol use: No  . Drug use: Yes    Types: Marijuana     Allergies   Peanut-containing drug products   Review of Systems Review of Systems Pertinent negatives listed in HPI Physical Exam Triage Vital Signs ED Triage Vitals  Enc Vitals Group     BP      Pulse      Resp      Temp      Temp src      SpO2      Weight      Height      Head Circumference      Peak Flow      Pain Score      Pain Loc      Pain Edu?      Excl. in [place]?    No data found.  Updated Vital Signs BP 134/78 (BP Location: Left Arm)   Pulse 92   Temp 98.2 F (36.8 C) (Oral)   Resp 18   LMP 09/24/2019   SpO2 99%   Visual Acuity Right Eye Distance:   Left Eye Distance:   Bilateral Distance:    Right Eye Near:   Left Eye Near:    Bilateral Near:     Physical Exam General appearance: alert,  well developed, well nourished, cooperative and in no distress Head: Normocephalic, without obvious abnormality, atraumatic Respiratory: Respirations even and unlabored, normal respiratory rate Heart: rate and rhythm normal. No gallop or murmurs noted on exam  Extremities: Left foot-tenderness, trace edema, third toe positive bony  Tenderness, negative deformity  Skin: Skin color, texture, turgor normal. No rashes seen  Psych: Appropriate mood and affect. Neurologic:Alert, oriented to person, place, and time, thought content appropriate.  UC Treatments / Results  Labs (all labs ordered are listed, but only abnormal results are displayed) Labs Reviewed - No data to display  EKG   Radiology DG Foot Complete Left  Result Date: 10/20/2019 CLINICAL DATA:  Foot injury EXAM: LEFT FOOT - COMPLETE 3+ VIEW COMPARISON:  None. FINDINGS: There is no evidence of fracture or dislocation. There is no evidence of arthropathy or other focal bone abnormality. Soft tissues are unremarkable. IMPRESSION: Negative. Electronically Signed   By: Donavan Foil M.D.   On: 10/20/2019 19:11    Procedures Procedures (including critical care time)  Medications Ordered in UC Medications - No data to display  Initial Impression / Assessment and Plan / UC Course  I have reviewed the triage vital signs and the nursing notes.  Pertinent labs & imaging results that were available during my care of the patient were reviewed by me and considered in my medical decision making (see chart for details).    Injury of right foot, initial encounter -Negative for acute fracture -Ace bandage applied left foot  -Continue post op shoe -Start Meloxicam daily as needed for pain -Start Cyclobenzaprine 10 mg 1-2 times daily as needed  -If no improvement return here for follow-up. Final Clinical Impressions(s) / UC Diagnoses   Final diagnoses:  Injury of right foot, initial encounter     Discharge Instructions     Take  medication as directed.  ED Prescriptions    Medication Sig Dispense Auth. Provider   meloxicam (MOBIC) 15 MG tablet Take 1 tablet (15 mg total) by mouth daily. 30 tablet Bing Neighbors, FNP   cyclobenzaprine (FLEXERIL) 10 MG tablet Take 1 tablet (10 mg total) by mouth 2 (two) times daily as needed for muscle spasms. 20 tablet Bing Neighbors, FNP     PDMP not reviewed this encounter.   Bing Neighbors, FNP 10/22/19 1032

## 2019-10-20 NOTE — ED Triage Notes (Signed)
Pt presents with left third toe injury from yesterday while at an arcade with her kids.

## 2020-04-12 IMAGING — DX DG FOOT COMPLETE 3+V*L*
3 series · 3 of 3 positions shown · non-contrast
Comparison: None.

CLINICAL DATA: Foot injury

EXAM:
LEFT FOOT - COMPLETE 3+ VIEW

[foot ap]
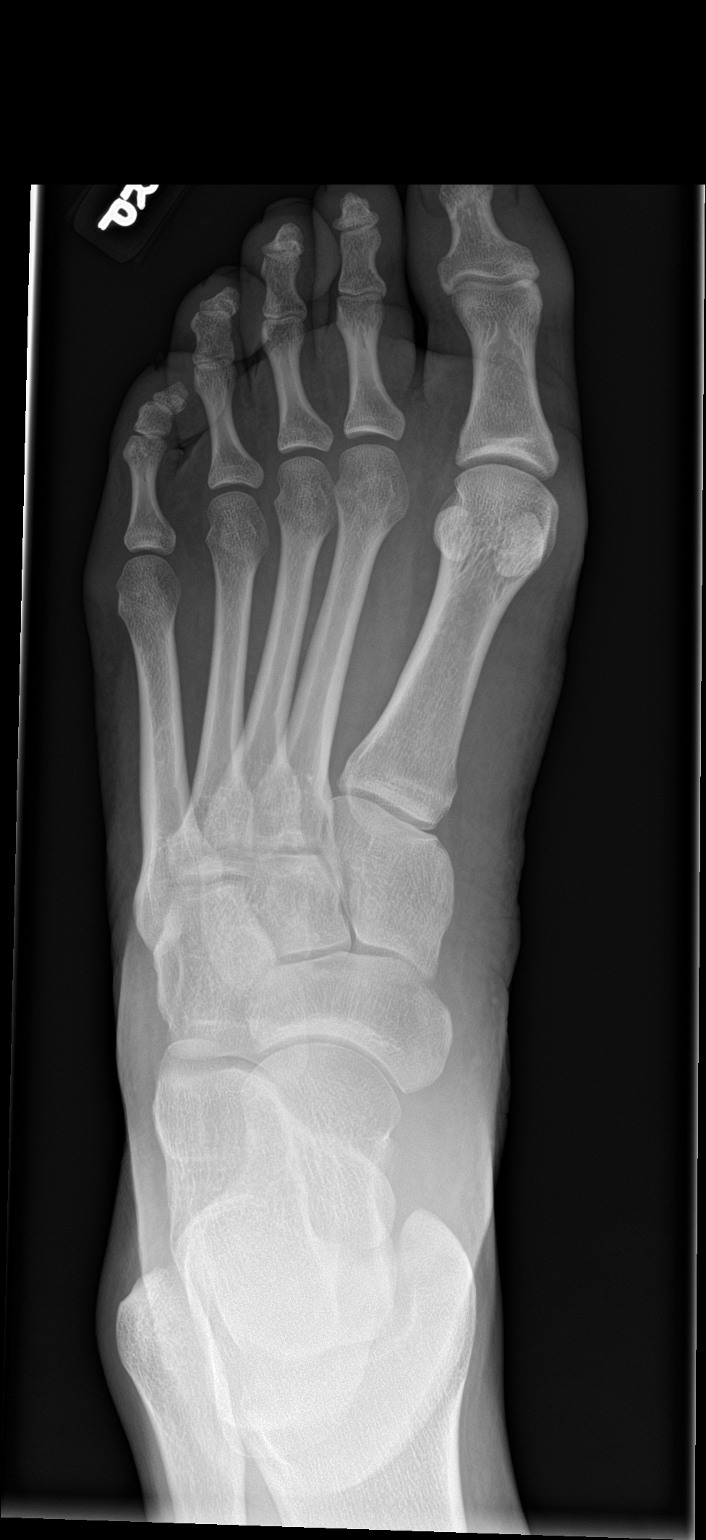

[foot obl]
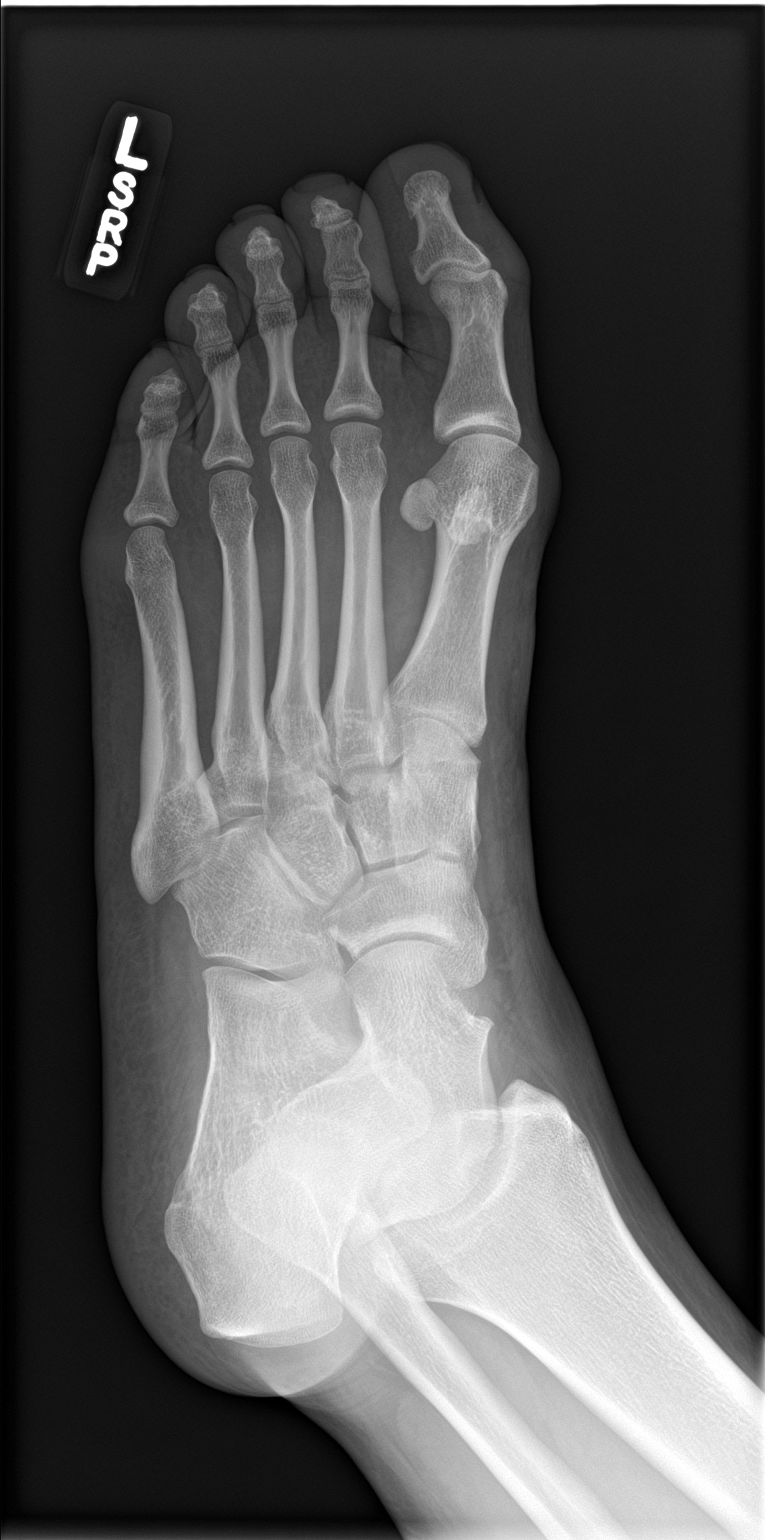

[foot lat]
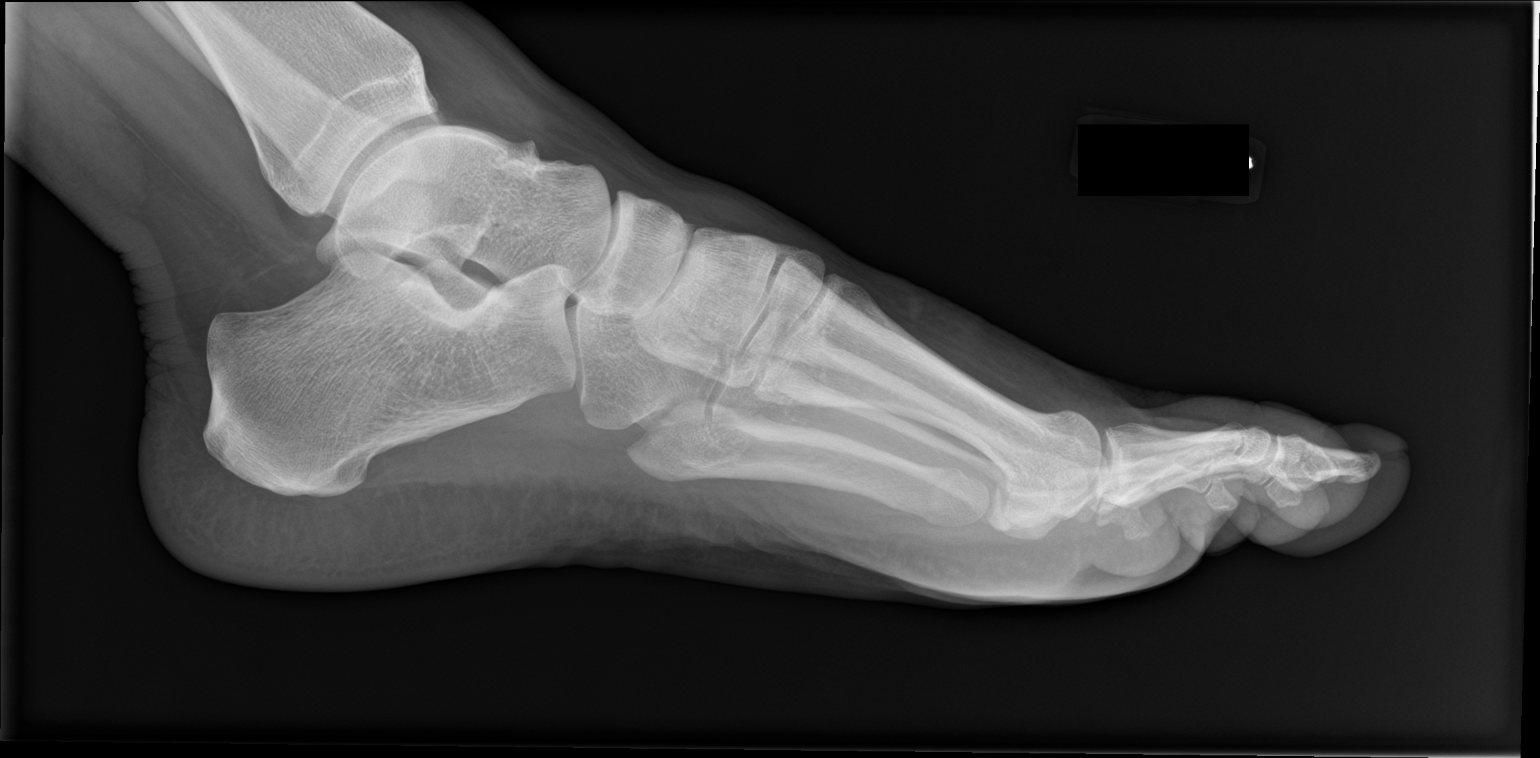

[3 of 3 positions shown; findings below may reference images not displayed]

FINDINGS: There is no evidence of fracture or dislocation. There is no
evidence of arthropathy or other focal bone abnormality. Soft
tissues are unremarkable.
IMPRESSION: Negative.

## 2020-04-25 ENCOUNTER — Ambulatory Visit: Payer: Medicaid Other | Admitting: Obstetrics and Gynecology

## 2020-05-31 ENCOUNTER — Ambulatory Visit (HOSPITAL_COMMUNITY)
Admission: EM | Admit: 2020-05-31 | Discharge: 2020-05-31 | Disposition: A | Discharge status: Home / Self Care | Payer: Medicaid Other | Attending: Family Medicine | Admitting: Family Medicine

## 2020-05-31 ENCOUNTER — Other Ambulatory Visit: Payer: Self-pay

## 2020-05-31 ENCOUNTER — Encounter (HOSPITAL_COMMUNITY): Payer: Self-pay | Admitting: *Deleted

## 2020-05-31 DIAGNOSIS — J039 Acute tonsillitis, unspecified: Secondary | ICD-10-CM | POA: Insufficient documentation

## 2020-05-31 LAB — POCT RAPID STREP A, ED / UC: Streptococcus, Group A Screen (Direct): NEGATIVE

## 2020-05-31 MED ORDER — HYDROCODONE-ACETAMINOPHEN 7.5-325 MG/15ML PO SOLN
15.0000 mL | Freq: Four times a day (QID) | ORAL | 0 refills | Status: DC | PRN
Start: 2020-05-31 — End: 2021-03-24

## 2020-05-31 MED ORDER — ONDANSETRON 4 MG PO TBDP
4.0000 mg | ORAL_TABLET | Freq: Once | ORAL | Status: AC
  Administered 2020-05-31: 4 mg via ORAL

## 2020-05-31 MED ORDER — KETOROLAC TROMETHAMINE 30 MG/ML IJ SOLN
INTRAMUSCULAR | Status: AC
  Filled 2020-05-31: qty 1

## 2020-05-31 MED ORDER — ONDANSETRON 4 MG PO TBDP
ORAL_TABLET | ORAL | Status: AC
  Filled 2020-05-31: qty 1

## 2020-05-31 MED ORDER — KETOROLAC TROMETHAMINE 30 MG/ML IJ SOLN
30.0000 mg | Freq: Once | INTRAMUSCULAR | Status: AC
  Administered 2020-05-31: 30 mg via INTRAMUSCULAR

## 2020-05-31 MED ORDER — ACETAMINOPHEN 325 MG PO TABS
650.0000 mg | ORAL_TABLET | Freq: Once | ORAL | Status: AC
  Administered 2020-05-31: 650 mg via ORAL

## 2020-05-31 MED ORDER — AMOXICILLIN 250 MG/5ML PO SUSR: 500.0000 mg | Freq: Two times a day (BID) | ORAL | 0 refills | Status: AC

## 2020-05-31 MED ORDER — ACETAMINOPHEN 325 MG PO TABS
ORAL_TABLET | ORAL | Status: AC
  Filled 2020-05-31: qty 2

## 2020-05-31 NOTE — ED Triage Notes (Signed)
Patient in with complaints of SOB, weakness and body aches and right side of throat soreness that started this morning. One episode of emesis while in room during triage. Patient received negative COVID-19 results while in the room and was told results were negative. Patient went for rapid testing today on [address] across from the old [facility] hospital.

## 2020-05-31 NOTE — Discharge Instructions (Addendum)
Home to rest [place] of fluids Take antibiotic 2 times a day Take pain medicine as needed Do not drive on pain medicine Expect improvement over the next 2 to 3 days

## 2020-05-31 NOTE — ED Provider Notes (Signed)
[facility]    CSN: [id number] Arrival date & time: 05/31/20  1114      History   Chief Complaint Chief Complaint  Patient presents with   Sore Throat   Generalized Body Aches   Shortness of Breath    HPI Lauren Costa is a 24 y.o. female.   HPI Patient states she had the acute onset today of severe sore throat, nausea and vomiting, weakness.  She went and got a Covid test which was negative.  She is here for further evaluation.  She has not been exposed to anyone with Covid or strep.  She has not had Covid vaccinations.  She does have a history of tonsillitis, strep in the past. Past Medical History:  Diagnosis Date   Anxiety    as a child   Bilateral breast lump 03/19/2016   Chronic kidney disease    kidney infection    Patient Active Problem List   Diagnosis Date Noted   Morning sickness 01/11/2019   Intrauterine pregnancy 04/19/2018   Nausea/vomiting in pregnancy 04/19/2018   Bacterial vaginitis 04/19/2018   Mild tetrahydrocannabinol (THC) abuse 07/17/2016   Supervision of normal first pregnancy 03/19/2016   Bilateral breast lump 03/19/2016    Past Surgical History:  Procedure Laterality Date   DENTAL SURGERY      OB History    Gravida  3   Para  1   Term  1   Preterm      AB  1   Living  1     SAB  0   TAB  1   Ectopic      Multiple  0   Live Births  1            Home Medications    Prior to Admission medications   Medication Sig Start Date End Date Taking? Authorizing Provider  amoxicillin (AMOXIL) 250 MG/5ML suspension Take 10 mLs (500 mg total) by mouth 2 (two) times daily for 10 days. 05/31/20 06/10/20  Eustace Moore, MD  HYDROcodone-acetaminophen (HYCET) 7.5-325 mg/15 ml solution Take 15 mLs by mouth every 6 (six) hours as needed for moderate pain. 05/31/20 05/31/21  Eustace Moore, MD  promethazine (PHENERGAN) 12.5 MG tablet Take 1 tablet (12.5 mg total) by mouth every 6 (six) hours as  needed for up to 30 days for nausea or vomiting. Can use vaginally if unable to keep anything down 01/11/19 10/05/19  Raelyn Mora, CNM    Family History Family History  Problem Relation Age of Onset   Cancer Maternal Aunt    Cancer Maternal Uncle    Alcohol abuse Neg Hx    Arthritis Neg Hx    Asthma Neg Hx    Birth defects Neg Hx    COPD Neg Hx    Depression Neg Hx    Diabetes Neg Hx    Drug abuse Neg Hx    Early death Neg Hx    Hearing loss Neg Hx    Heart disease Neg Hx    Hyperlipidemia Neg Hx    Hypertension Neg Hx    Kidney disease Neg Hx    Learning disabilities Neg Hx    Mental illness Neg Hx    Mental retardation Neg Hx    Miscarriages / Stillbirths Neg Hx    Stroke Neg Hx    Vision loss Neg Hx    Varicose Veins Neg Hx     Social History Social History   Tobacco Use  Smoking status: Current Some Day Smoker    Last attempt to quit: 04/19/2016    Years since quitting: 4.1   Smokeless tobacco: Former Forensic psychologist Use: Never used  Substance Use Topics   Alcohol use: No   Drug use: Not Currently    Types: Marijuana     Allergies   Peanut-containing drug products   Review of Systems Review of Systems See HPI  Physical Exam Triage Vital Signs ED Triage Vitals  Enc Vitals Group     BP 05/31/20 1214 140/81     Pulse Rate 05/31/20 1214 91     Resp --      Temp 05/31/20 1214 (!) 100.5 F (38.1 C)     Temp Source 05/31/20 1214 Oral     SpO2 05/31/20 1214 98 %     Weight --      Height --      Head Circumference --      Peak Flow --      Pain Score 05/31/20 1218 10     Pain Loc --      Pain Edu? --      Excl. in GC? --    No data found.  Updated Vital Signs BP 140/81    Pulse 91    Temp (!) 100.5 F (38.1 C) (Oral)    LMP 05/28/2020    SpO2 98%      Physical Exam Constitutional:      General: She is not in acute distress.    Appearance: She is well-developed.     Comments: Appears uncomfortable,  tired, moderately ill  HENT:     Head: Normocephalic and atraumatic.     Right Ear: Tympanic membrane and ear canal normal.     Left Ear: Tympanic membrane and ear canal normal.     Nose: Nose normal.     Mouth/Throat:     Mouth: Mucous membranes are moist.     Pharynx: Pharyngeal swelling and oropharyngeal exudate present.     Comments: Tonsils are enlarged.  Erythematous.  Covered in yellow exudate. Eyes:     Conjunctiva/sclera: Conjunctivae normal.     Pupils: Pupils are equal, round, and reactive to light.  Neck:     Thyroid: No thyromegaly.  Cardiovascular:     Rate and Rhythm: Regular rhythm. Tachycardia present.     Heart sounds: Normal heart sounds.  Pulmonary:     Effort: Pulmonary effort is normal. No respiratory distress.     Breath sounds: Normal breath sounds.  Abdominal:     Palpations: Abdomen is soft.  Musculoskeletal:        General: Normal range of motion.     Cervical back: Normal range of motion.  Lymphadenopathy:     Cervical: Cervical adenopathy present.  Skin:    General: Skin is warm and dry.  Neurological:     Mental Status: She is alert.  Psychiatric:        Mood and Affect: Mood normal.        Behavior: Behavior normal.     Comments: Tearful      UC Treatments / Results  Labs (all labs ordered are listed, but only abnormal results are displayed) Labs Reviewed  CULTURE, GROUP A STREP [facility])  POCT RAPID STREP A, ED / UC    EKG   Radiology No results found.  Procedures Procedures (including critical care time)  Medications Ordered in UC Medications  acetaminophen (TYLENOL) tablet 650 mg (  650 mg Oral Given 05/31/20 1331)  ondansetron (ZOFRAN-ODT) disintegrating tablet 4 mg (4 mg Oral Given 05/31/20 1228)  ketorolac (TORADOL) 30 MG/ML injection 30 mg (30 mg Intramuscular Given 05/31/20 1330)    Initial Impression / Assessment and Plan / UC Course  I have reviewed the triage vital signs and the nursing notes.  Pertinent labs &  imaging results that were available during my care of the patient were reviewed by me and considered in my medical decision making (see chart for details).    Patient is acutely uncomfortable.  Tearful.  States that she feels weak.  I gave her a shot of Toradol for the pain.  She states this did give her some improvement.  Was sent home with oral antibiotics and pain medicine.  These are both in liquid form at her request.  Although her strep test is negative, her tonsils are very enlarged, painful, red and covered with exudate.  This along with her fever Nexium will cover the antibiotics just in case her strep test comes back positive on culture. Final Clinical Impressions(s) / UC Diagnoses   Final diagnoses:  Acute tonsillitis, unspecified etiology     Discharge Instructions     Home to rest Plenty of fluids Take antibiotic 2 times a day Take pain medicine as needed Do not drive on pain medicine Expect improvement over the next 2 to 3 days   ED Prescriptions    Medication Sig Dispense Auth. Provider   amoxicillin (AMOXIL) 250 MG/5ML suspension Take 10 mLs (500 mg total) by mouth 2 (two) times daily for 10 days. 200 mL Eustace Moore, MD   HYDROcodone-acetaminophen (HYCET) 7.5-325 mg/15 ml solution Take 15 mLs by mouth every 6 (six) hours as needed for moderate pain. 120 mL Eustace Moore, MD     I have reviewed the PDMP during this encounter.   Eustace Moore, MD 05/31/20 [id number]

## 2020-06-01 ENCOUNTER — Telehealth (HOSPITAL_COMMUNITY): Payer: Self-pay

## 2020-06-01 LAB — CULTURE, GROUP A STREP (THRC)

## 2020-06-01 MED ORDER — LIDOCAINE VISCOUS HCL 2 % MT SOLN: 10.0000 mL | OROMUCOSAL | 0 refills | Status: DC | PRN

## 2020-06-01 MED ORDER — IBUPROFEN 600 MG PO TABS: 600.0000 mg | ORAL_TABLET | Freq: Four times a day (QID) | ORAL | 0 refills | Status: DC | PRN

## 2020-06-07 ENCOUNTER — Ambulatory Visit: Payer: Medicaid Other | Admitting: Obstetrics and Gynecology

## 2020-12-03 ENCOUNTER — Encounter (HOSPITAL_COMMUNITY): Payer: Self-pay

## 2020-12-03 ENCOUNTER — Ambulatory Visit (HOSPITAL_COMMUNITY)
Admission: EM | Admit: 2020-12-03 | Discharge: 2020-12-03 | Disposition: A | Discharge status: Home / Self Care | Payer: Medicaid Other | Attending: Physician Assistant | Admitting: Physician Assistant

## 2020-12-03 ENCOUNTER — Other Ambulatory Visit: Payer: Self-pay

## 2020-12-03 DIAGNOSIS — J029 Acute pharyngitis, unspecified: Secondary | ICD-10-CM | POA: Diagnosis present

## 2020-12-03 LAB — POCT RAPID STREP A, ED / UC: Streptococcus, Group A Screen (Direct): NEGATIVE

## 2020-12-03 MED ORDER — AMOXICILLIN 500 MG PO CAPS: 500.0000 mg | ORAL_CAPSULE | Freq: Two times a day (BID) | ORAL | 0 refills | Status: AC

## 2020-12-03 NOTE — ED Triage Notes (Signed)
Pt reports swelling tonsils and neck x 3 days; chills x 1 day. Ibuprofen gives relief. Pt reports she is taking leftover amoxicillin, today is day #2.  Pain 10/10 when not taking ibuprofen. Denies pain at this moment.

## 2020-12-03 NOTE — Discharge Instructions (Signed)
Test results pending Will change treatment plan if needed Recommend salt water gargles Take Ibuprofen as needed for pain

## 2020-12-03 NOTE — ED Provider Notes (Signed)
[facility]    CSN: [id number] Arrival date & time: 12/03/20  1833      History   Chief Complaint Chief Complaint  Patient presents with  . Sore Throat    HPI Lauren Costa is a 25 y.o. female.   Pt complains of sore throat that started three days ago.  Denies congestion, fever, chills, cough.  Reports exudate which she can wipe off.  She has tried ibuprofen with some relief.  Pt is sexually active, reports oral sex.       Past Medical History:  Diagnosis Date  . Anxiety    as a child  . Bilateral breast lump 03/19/2016  . Chronic kidney disease    kidney infection    Patient Active Problem List   Diagnosis Date Noted  . Morning sickness 01/11/2019  . Intrauterine pregnancy 04/19/2018  . Nausea/vomiting in pregnancy 04/19/2018  . Bacterial vaginitis 04/19/2018  . Mild tetrahydrocannabinol (THC) abuse 07/17/2016  . Supervision of normal first pregnancy 03/19/2016  . Bilateral breast lump 03/19/2016    Past Surgical History:  Procedure Laterality Date  . DENTAL SURGERY      OB History    Gravida  3   Para  1   Term  1   Preterm      AB  1   Living  1     SAB  0   IAB  1   Ectopic      Multiple  0   Live Births  1            Home Medications    Prior to Admission medications   Medication Sig Start Date End Date Taking? Authorizing Provider  amoxicillin (AMOXIL) 500 MG capsule Take 1 capsule (500 mg total) by mouth 2 (two) times daily for 10 days. 12/03/20 12/13/20 Yes Corky Blumstein, Shanda Bumps, PA-C  HYDROcodone-acetaminophen (HYCET) 7.5-325 mg/15 ml solution Take 15 mLs by mouth every 6 (six) hours as needed for moderate pain. 05/31/20 05/31/21  Eustace Moore, MD  ibuprofen (ADVIL) 600 MG tablet Take 1 tablet (600 mg total) by mouth every 6 (six) hours as needed. 06/01/20   Particia Nearing, PA-C  lidocaine (XYLOCAINE) 2 % solution Use as directed 10 mLs in the mouth or throat as needed for mouth pain. 06/01/20   Particia Nearing, PA-C  promethazine (PHENERGAN) 12.5 MG tablet Take 1 tablet (12.5 mg total) by mouth every 6 (six) hours as needed for up to 30 days for nausea or vomiting. Can use vaginally if unable to keep anything down 01/11/19 10/05/19  Raelyn Mora, CNM    Family History Family History  Problem Relation Age of Onset  . Cancer Maternal Aunt   . Cancer Maternal Uncle   . Alcohol abuse Neg Hx   . Arthritis Neg Hx   . Asthma Neg Hx   . Birth defects Neg Hx   . COPD Neg Hx   . Depression Neg Hx   . Diabetes Neg Hx   . Drug abuse Neg Hx   . Early death Neg Hx   . Hearing loss Neg Hx   . Heart disease Neg Hx   . Hyperlipidemia Neg Hx   . Hypertension Neg Hx   . Kidney disease Neg Hx   . Learning disabilities Neg Hx   . Mental illness Neg Hx   . Mental retardation Neg Hx   . Miscarriages / Stillbirths Neg Hx   . Stroke Neg Hx   .  Vision loss Neg Hx   . Varicose Veins Neg Hx     Social History Social History   Tobacco Use  . Smoking status: Current Some Day Smoker    Last attempt to quit: 04/19/2016    Years since quitting: 4.6  . Smokeless tobacco: Former Clinical biochemist  . Vaping Use: Never used  Substance Use Topics  . Alcohol use: No  . Drug use: Not Currently    Types: Marijuana     Allergies   Peanut-containing drug products   Review of Systems Review of Systems  Constitutional: Negative for chills and fever.  HENT: Positive for sore throat. Negative for ear pain.   Eyes: Negative for pain and visual disturbance.  Respiratory: Negative for cough and shortness of breath.   Cardiovascular: Negative for chest pain and palpitations.  Gastrointestinal: Negative for abdominal pain and vomiting.  Genitourinary: Negative for dysuria and hematuria.  Musculoskeletal: Negative for arthralgias and back pain.  Skin: Negative for color change and rash.  Neurological: Negative for seizures and syncope.  All other systems reviewed and are negative.    Physical  Exam Triage Vital Signs ED Triage Vitals  Enc Vitals Group     BP 12/03/20 1948 129/78     Pulse Rate 12/03/20 1948 74     Resp 12/03/20 1948 17     Temp 12/03/20 1948 98.7 F (37.1 C)     Temp Source 12/03/20 1948 Oral     SpO2 12/03/20 1948 100 %     Weight --      Height --      Head Circumference --      Peak Flow --      Pain Score 12/03/20 1946 0     Pain Loc --      Pain Edu? --      Excl. in GC? --    No data found.  Updated Vital Signs BP 129/78 (BP Location: Left Arm)   Pulse 74   Temp 98.7 F (37.1 C) (Oral)   Resp 17   LMP 11/19/2020 (Exact Date)   SpO2 100%   Breastfeeding No   Visual Acuity Right Eye Distance:   Left Eye Distance:   Bilateral Distance:    Right Eye Near:   Left Eye Near:    Bilateral Near:     Physical Exam Vitals and nursing note reviewed.  Constitutional:      General: She is not in acute distress.    Appearance: She is well-developed.  HENT:     Head: Normocephalic and atraumatic.     Mouth/Throat:     Pharynx: Pharyngeal swelling and oropharyngeal exudate present.     Tonsils: Tonsillar exudate present. No tonsillar abscesses.  Eyes:     Conjunctiva/sclera: Conjunctivae normal.  Cardiovascular:     Rate and Rhythm: Normal rate and regular rhythm.     Heart sounds: No murmur heard.   Pulmonary:     Effort: Pulmonary effort is normal. No respiratory distress.     Breath sounds: Normal breath sounds.  Abdominal:     Palpations: Abdomen is soft.     Tenderness: There is no abdominal tenderness.  Musculoskeletal:     Cervical back: Neck supple.  Skin:    General: Skin is warm and dry.  Neurological:     Mental Status: She is alert.      UC Treatments / Results  Labs (all labs ordered are listed, but only abnormal results are displayed) Labs  Reviewed  CULTURE, GROUP A STREP [facility])  POCT RAPID STREP A, ED / UC  CYTOLOGY, (ORAL, ANAL, URETHRAL) ANCILLARY ONLY    EKG   Radiology No results  found.  Procedures Procedures (including critical care time)  Medications Ordered in UC Medications - No data to display  Initial Impression / Assessment and Plan / UC Course  I have reviewed the triage vital signs and the nursing notes.  Pertinent labs & imaging results that were available during my care of the patient were reviewed by me and considered in my medical decision making (see chart for details).     Pharyngitis. Negative rapid strep here in clinic.  Will treat for presumptive strep.  Throat culture sent.  Also test for gonorrhea of the throat, will treat if necessary based on labs.   Final Clinical Impressions(s) / UC Diagnoses   Final diagnoses:  Pharyngitis, unspecified etiology     Discharge Instructions     Test results pending Will change treatment plan if needed Recommend salt water gargles Take Ibuprofen as needed for pain    ED Prescriptions    Medication Sig Dispense Auth. Provider   amoxicillin (AMOXIL) 500 MG capsule Take 1 capsule (500 mg total) by mouth 2 (two) times daily for 10 days. 20 capsule Jodell Cipro, PA-C     PDMP not reviewed this encounter.   Jodell Cipro, PA-C 12/03/20 2017

## 2020-12-04 LAB — CYTOLOGY, (ORAL, ANAL, URETHRAL) ANCILLARY ONLY
Chlamydia: NEGATIVE
Comment: NEGATIVE
Comment: NEGATIVE
Comment: NORMAL
Neisseria Gonorrhea: NEGATIVE
Trichomonas: NEGATIVE

## 2020-12-06 LAB — CULTURE, GROUP A STREP (THRC)

## 2021-03-24 ENCOUNTER — Other Ambulatory Visit: Payer: Self-pay

## 2021-03-24 ENCOUNTER — Inpatient Hospital Stay (HOSPITAL_COMMUNITY)
Admission: AD | Admit: 2021-03-24 | Discharge: 2021-03-24 | Disposition: A | Discharge status: Home / Self Care | Payer: Medicaid Other | Attending: Family Medicine | Admitting: Family Medicine

## 2021-03-24 ENCOUNTER — Inpatient Hospital Stay (HOSPITAL_COMMUNITY): Payer: Medicaid Other

## 2021-03-24 ENCOUNTER — Encounter (HOSPITAL_COMMUNITY): Payer: Self-pay | Admitting: Family Medicine

## 2021-03-24 DIAGNOSIS — O99891 Other specified diseases and conditions complicating pregnancy: Secondary | ICD-10-CM | POA: Diagnosis not present

## 2021-03-24 DIAGNOSIS — N8311 Corpus luteum cyst of right ovary: Secondary | ICD-10-CM | POA: Insufficient documentation

## 2021-03-24 DIAGNOSIS — O219 Vomiting of pregnancy, unspecified: Secondary | ICD-10-CM | POA: Diagnosis not present

## 2021-03-24 DIAGNOSIS — R112 Nausea with vomiting, unspecified: Secondary | ICD-10-CM

## 2021-03-24 DIAGNOSIS — O3481 Maternal care for other abnormalities of pelvic organs, first trimester: Secondary | ICD-10-CM | POA: Insufficient documentation

## 2021-03-24 DIAGNOSIS — R109 Unspecified abdominal pain: Secondary | ICD-10-CM

## 2021-03-24 DIAGNOSIS — O3680X0 Pregnancy with inconclusive fetal viability, not applicable or unspecified: Secondary | ICD-10-CM | POA: Diagnosis not present

## 2021-03-24 DIAGNOSIS — Z3A01 Less than 8 weeks gestation of pregnancy: Secondary | ICD-10-CM | POA: Diagnosis not present

## 2021-03-24 DIAGNOSIS — O26891 Other specified pregnancy related conditions, first trimester: Secondary | ICD-10-CM

## 2021-03-24 LAB — CBC
HCT: 38.5 % (ref 36.0–46.0)
Hemoglobin: 12.2 g/dL (ref 12.0–15.0)
MCH: 26.5 pg (ref 26.0–34.0)
MCHC: 31.7 g/dL (ref 30.0–36.0)
MCV: 83.7 fL (ref 80.0–100.0)
Platelets: 244 10*3/uL (ref 150–400)
RBC: 4.6 MIL/uL (ref 3.87–5.11)
RDW: 14.4 % (ref 11.5–15.5)
WBC: 8.6 10*3/uL (ref 4.0–10.5)
nRBC: 0 % (ref 0.0–0.2)

## 2021-03-24 LAB — URINALYSIS, ROUTINE W REFLEX MICROSCOPIC
Bilirubin Urine: NEGATIVE
Glucose, UA: NEGATIVE mg/dL
Hgb urine dipstick: NEGATIVE
Ketones, ur: NEGATIVE mg/dL
Leukocytes,Ua: NEGATIVE
Nitrite: NEGATIVE
Protein, ur: NEGATIVE mg/dL
Specific Gravity, Urine: 1.023 (ref 1.005–1.030)
pH: 6 (ref 5.0–8.0)

## 2021-03-24 LAB — POCT PREGNANCY, URINE: Preg Test, Ur: POSITIVE — AB

## 2021-03-24 LAB — WET PREP, GENITAL
Clue Cells Wet Prep HPF POC: NONE SEEN
Sperm: NONE SEEN
Trich, Wet Prep: NONE SEEN
Yeast Wet Prep HPF POC: NONE SEEN

## 2021-03-24 LAB — HCG, QUANTITATIVE, PREGNANCY: hCG, Beta Chain, Quant, S: 1484 m[IU]/mL — ABNORMAL HIGH (ref <5)

## 2021-03-24 MED ORDER — METOCLOPRAMIDE HCL 10 MG PO TABS
10.0000 mg | ORAL_TABLET | Freq: Once | ORAL | Status: AC
  Administered 2021-03-24: 10 mg via ORAL
  Filled 2021-03-24: qty 1

## 2021-03-24 MED ORDER — METOCLOPRAMIDE HCL 10 MG PO TABS
10.0000 mg | ORAL_TABLET | Freq: Three times a day (TID) | ORAL | 0 refills | Status: DC | PRN
Start: 2021-03-24 — End: 2022-12-17

## 2021-03-24 NOTE — MAU Provider Note (Signed)
History     CSN: [id number]  Arrival date and time: 03/24/21 7341   Event Date/Time   First Provider Initiated Contact with Patient 03/24/21 1033      Chief Complaint  Patient presents with   Abdominal Pain   Emesis   Nausea   HPI Lauren Costa is a 25 y.o. [id number] at [redacted]w[redacted]d who presents with abdominal cramping & nausea. Has had some nausea for the last week. Symptoms worsened last night & is concerned that it is related to what she ate - honey chipotle chicken wings.  Reports mid lower abdominal cramping. Denies diarrhea, constipation, vaginal bleeding, dysuria, or vaginal discharge. Rates pain 8/10. Hasn't treated symptoms. Hasn't vomited since this morning. Plans on going to [facility].   OB History     Gravida  4   Para  1   Term  1   Preterm      AB  2   Living  1      SAB  0   IAB  2   Ectopic      Multiple  0   Live Births  1           Past Medical History:  Diagnosis Date   Anxiety    as a child   Bilateral breast lump 03/19/2016   Chronic kidney disease    kidney infection    Past Surgical History:  Procedure Laterality Date   DENTAL SURGERY      Family History  Problem Relation Age of Onset   Cancer Maternal Aunt    Cancer Maternal Uncle    Alcohol abuse Neg Hx    Arthritis Neg Hx    Asthma Neg Hx    Birth defects Neg Hx    COPD Neg Hx    Depression Neg Hx    Diabetes Neg Hx    Drug abuse Neg Hx    Early death Neg Hx    Hearing loss Neg Hx    Heart disease Neg Hx    Hyperlipidemia Neg Hx    Hypertension Neg Hx    Kidney disease Neg Hx    Learning disabilities Neg Hx    Mental illness Neg Hx    Mental retardation Neg Hx    Miscarriages / Stillbirths Neg Hx    Stroke Neg Hx    Vision loss Neg Hx    Varicose Veins Neg Hx     Social History   Tobacco Use   Smoking status: Some Days    Types: Cigarettes    Last attempt to quit: 04/19/2016    Years since quitting: 4.9   Smokeless tobacco: Former  Building services engineer Use:  Never used  Substance Use Topics   Alcohol use: No   Drug use: Not Currently    Types: Marijuana    Allergies:  Allergies  Allergen Reactions   Peanut-Containing Drug Products Hives    Medications Prior to Admission  Medication Sig Dispense Refill Last Dose   HYDROcodone-acetaminophen (HYCET) 7.5-325 mg/15 ml solution Take 15 mLs by mouth every 6 (six) hours as needed for moderate pain. 120 mL 0    ibuprofen (ADVIL) 600 MG tablet Take 1 tablet (600 mg total) by mouth every 6 (six) hours as needed. 30 tablet 0    lidocaine (XYLOCAINE) 2 % solution Use as directed 10 mLs in the mouth or throat as needed for mouth pain. 100 mL 0     Review of Systems  Constitutional: Negative.  Gastrointestinal:  Positive for abdominal pain, nausea and vomiting. Negative for constipation and diarrhea.  Genitourinary: Negative.   Physical Exam   Blood pressure 124/71, pulse 67, temperature 98.6 F (37 C), temperature source Oral, resp. rate 18, height 5\' 5"  (1.651 m), weight 108 kg, last menstrual period 02/15/2021, SpO2 100 %.  Physical Exam Vitals and nursing note reviewed.  Constitutional:      Appearance: She is well-developed. She is not toxic-appearing.  HENT:     Head: Normocephalic and atraumatic.  Eyes:     General: No scleral icterus. Pulmonary:     Effort: Pulmonary effort is normal. No respiratory distress.  Abdominal:     General: Abdomen is flat.     Palpations: Abdomen is soft.     Tenderness: There is no abdominal tenderness.  Skin:    General: Skin is warm and dry.  Neurological:     Mental Status: She is alert.  Psychiatric:        Mood and Affect: Mood normal.        Behavior: Behavior normal.    MAU Course  Procedures Results for orders placed or performed during the hospital encounter of 03/24/21 (from the past 24 hour(s))  Pregnancy, urine POC     Status: Abnormal   Collection Time: 03/24/21  9:42 AM  Result Value Ref Range   Preg Test, Ur POSITIVE (A)  NEGATIVE  Urinalysis, Routine w reflex microscopic Urine, Clean Catch     Status: None   Collection Time: 03/24/21  9:50 AM  Result Value Ref Range   Color, Urine YELLOW YELLOW   APPearance CLEAR CLEAR   Specific Gravity, Urine 1.023 1.005 - 1.030   pH 6.0 5.0 - 8.0   Glucose, UA NEGATIVE NEGATIVE mg/dL   Hgb urine dipstick NEGATIVE NEGATIVE   Bilirubin Urine NEGATIVE NEGATIVE   Ketones, ur NEGATIVE NEGATIVE mg/dL   Protein, ur NEGATIVE NEGATIVE mg/dL   Nitrite NEGATIVE NEGATIVE   Leukocytes,Ua NEGATIVE NEGATIVE  Wet prep, genital     Status: Abnormal   Collection Time: 03/24/21 10:47 AM   Specimen: PATH Cytology Cervicovaginal Ancillary Only  Result Value Ref Range   Yeast Wet Prep HPF POC NONE SEEN NONE SEEN   Trich, Wet Prep NONE SEEN NONE SEEN   Clue Cells Wet Prep HPF POC NONE SEEN NONE SEEN   WBC, Wet Prep HPF POC MODERATE (A) NONE SEEN   Sperm NONE SEEN   CBC     Status: None   Collection Time: 03/24/21 11:07 AM  Result Value Ref Range   WBC 8.6 4.0 - 10.5 K/uL   RBC 4.60 3.87 - 5.11 MIL/uL   Hemoglobin 12.2 12.0 - 15.0 g/dL   HCT 03/26/21 42.7 - 06.2 %   MCV 83.7 80.0 - 100.0 fL   MCH 26.5 26.0 - 34.0 pg   MCHC 31.7 30.0 - 36.0 g/dL   RDW 37.6 28.3 - 15.1 %   Platelets 244 150 - 400 K/uL   nRBC 0.0 0.0 - 0.2 %  hCG, quantitative, pregnancy     Status: Abnormal   Collection Time: 03/24/21 11:07 AM  Result Value Ref Range   hCG, Beta Chain, Quant, S 1,484 (H) <5 mIU/mL   03/26/21 OB LESS THAN 14 WEEKS WITH OB TRANSVAGINAL  Result Date: 03/24/2021 CLINICAL DATA:  Abdominal pain EXAM: OBSTETRIC <14 WK 03/26/2021 AND TRANSVAGINAL OB US TECHNIQUE: Both transabdominal and transvaginal ultrasound examinations were performed for complete evaluation of the gestation as well as the maternal  uterus, adnexal regions, and pelvic cul-de-sac. Transvaginal technique was performed to assess early pregnancy. COMPARISON:  None. FINDINGS: Intrauterine gestational sac: Single Yolk sac:  Not Visualized.  Embryo:  Not Visualized. Cardiac Activity: Not Visualized. MSD: 3.3 mm   5 w   0 d Subchorionic hemorrhage:  None visualized. Maternal uterus/adnexae: Left ovary measures 2.5 x 1.2 x 2.0 cm and appears unremarkable. The right ovary measures 4.0 x 2.7 x 3.6 cm. Right ovarian corpus luteal cyst is noted. There is an adjacent well-circumscribed anechoic cyst abutting the right ovary measuring up to 3.2 cm. Small volume free fluid within the pelvis. IMPRESSION: 1. Probable early intrauterine gestational sac, but no yolk sac, fetal pole, or cardiac activity yet visualized. Recommend follow-up quantitative B-HCG levels and follow-up US in 14 days to assess viability. This recommendation follows SRU consensus guidelines: Diagnostic Criteria for Nonviable Pregnancy Early in the First Trimester. Malva Limes Med 2013; 924:2683-41. 2. Simple appearing 3.2 cm right adnexal cyst, likely a paraovarian cyst. 3. Small volume free fluid within the pelvis. Electronically Signed   By: Duanne Guess D.O.   On: 03/24/2021 12:35    MDM +UPT UA, wet prep, GC/chlamydia, CBC, ABO/Rh, quant hCG, and [place] today to rule out ectopic pregnancy which can be life threatening.   Wet prep negative  Reglan given for nausea - pt reports improvement in symptoms & would like rx  Ultrasound shows ?IUGS, no yolk sac. Right parovarian cyst. HCG today is 1484. Reviewed with Dr. Shawnie Pons who recommends HCG in 48 hrs & ectopic precautions  Assessment and Plan   1. Pregnancy of unknown anatomic location   2. Abdominal pain during pregnancy in first trimester   3. Nausea and vomiting during pregnancy   4. [redacted] weeks gestation of pregnancy    -Patient to go to [facility] for stat HCG Tuesday (unable to schedule myself, message sent to office) -Rx reglan -Reviewed reasons to return to MAU including s/s of ectopic pregnancy -GC/CT pending  Judeth Horn 03/24/2021, 10:33 AM

## 2021-03-24 NOTE — MAU Note (Signed)
Pt asking registration staff if she can wait for results in car due to child care.  Pt advised she can wait in car but not to leave hospital

## 2021-03-24 NOTE — Discharge Instructions (Signed)
Return to care  If you have heavier bleeding that soaks through more than 2 pads per hour for an hour or more If you bleed so much that you feel like you might pass out or you do pass out If you have significant abdominal pain that is not improved with Tylenol   

## 2021-03-24 NOTE — MAU Note (Signed)
Presents with c/o abdominal cramping since last night.  States unsure if cramping d/t something she ate.  Reports has N/V last night too, also unsure if d/t food eaten.  Denies VB.  LMP 02/15/2021.

## 2021-03-25 LAB — GC/CHLAMYDIA PROBE AMP (~~LOC~~) NOT AT ARMC
Chlamydia: NEGATIVE
Comment: NEGATIVE
Comment: NORMAL
Neisseria Gonorrhea: NEGATIVE

## 2021-03-26 ENCOUNTER — Ambulatory Visit: Payer: Medicaid Other

## 2021-05-13 ENCOUNTER — Encounter (HOSPITAL_COMMUNITY): Payer: Self-pay | Admitting: Radiology

## 2021-06-25 ENCOUNTER — Encounter: Payer: Self-pay | Admitting: Family Medicine

## 2021-06-25 ENCOUNTER — Ambulatory Visit: Payer: Medicaid Other | Attending: Family Medicine | Admitting: Family Medicine

## 2021-06-25 ENCOUNTER — Other Ambulatory Visit: Payer: Self-pay

## 2021-06-25 ENCOUNTER — Other Ambulatory Visit (HOSPITAL_COMMUNITY)
Admission: RE | Admit: 2021-06-25 | Discharge: 2021-06-25 | Disposition: A | Discharge status: Home / Self Care | Payer: Medicaid Other | Source: Ambulatory Visit | Attending: Family Medicine | Admitting: Family Medicine

## 2021-06-25 VITALS — BP 127/81 | HR 85 | Ht 65.0 in | Wt 234.8 lb

## 2021-06-25 DIAGNOSIS — Z124 Encounter for screening for malignant neoplasm of cervix: Secondary | ICD-10-CM | POA: Diagnosis not present

## 2021-06-25 DIAGNOSIS — Z13228 Encounter for screening for other metabolic disorders: Secondary | ICD-10-CM

## 2021-06-25 DIAGNOSIS — Z1159 Encounter for screening for other viral diseases: Secondary | ICD-10-CM

## 2021-06-25 DIAGNOSIS — Z23 Encounter for immunization: Secondary | ICD-10-CM | POA: Diagnosis not present

## 2021-06-25 DIAGNOSIS — Z113 Encounter for screening for infections with a predominantly sexual mode of transmission: Secondary | ICD-10-CM | POA: Diagnosis present

## 2021-06-25 DIAGNOSIS — Z0000 Encounter for general adult medical examination without abnormal findings: Secondary | ICD-10-CM | POA: Diagnosis not present

## 2021-06-25 DIAGNOSIS — L732 Hidradenitis suppurativa: Secondary | ICD-10-CM

## 2021-06-25 MED ORDER — CLINDAMYCIN PHOSPHATE 1 % EX SOLN: Freq: Two times a day (BID) | CUTANEOUS | 1 refills | Status: DC

## 2021-06-25 MED ORDER — HIBICLENS 4 % EX LIQD: Freq: Every day | CUTANEOUS | 1 refills | Status: DC | PRN

## 2021-06-25 NOTE — Patient Instructions (Addendum)
Hidradenitis Suppurativa Hidradenitis suppurativa is a long-term (chronic) skin disease. It is similar to a severe form of acne, but it affects areas of the body where acne would be unusual, especially areas of the body where skin rubs against skin and becomes moist. These include: Underarms. Groin. Genital area. Buttocks. Upper thighs. Breasts. Hidradenitis suppurativa may start out as small lumps or pimples caused by blocked sweat glands or hair follicles. Pimples may develop into deep sores that break open (rupture) and drain pus. Over time, affected areas of skin may thicken and become scarred. This condition is rare and does not spread from person to person (non-contagious). What are the causes? The exact cause of this condition is not known. It may be related to: Female and female hormones. An overactive disease-fighting system (immune system). The immune system may over-react to blocked hair follicles or sweat glands and cause swelling and pus-filled sores. What increases the risk? You are more likely to develop this condition if you: Are female. Are 11-55 years old. Have a family history of hidradenitis suppurativa. Have a personal history of acne. Are overweight. Smoke. Take the medicine lithium. What are the signs or symptoms? The first symptoms are usually painful bumps in the skin, similar to pimples. The condition may get worse over time (progress), or it may only cause mild symptoms. If the disease progresses, symptoms may include: Skin bumps getting bigger and growing deeper into the skin. Bumps rupturing and draining pus. Itchy, infected skin. Skin getting thicker and scarred. Tunnels under the skin (fistulas) where pus drains from a bump. Pain during daily activities, such as pain during walking if your groin area is affected. Emotional problems, such as stress or depression. This condition may affect your appearance and your ability or willingness to wear certain clothes  or do certain activities. How is this diagnosed? This condition is diagnosed by a health care provider who specializes in skin diseases (dermatologist). You may be diagnosed based on: Your symptoms and medical history. A physical exam. Testing a pus sample for infection. Blood tests. How is this treated? Your treatment will depend on how severe your symptoms are. The same treatment will not work for everybody with this condition. You may need to try several treatments to find what works best for you. Treatment may include: Cleaning and bandaging (dressing) your wounds as needed. Lifestyle changes, such as new skin care routines. Taking medicines, such as: Antibiotics. Acne medicines. Medicines to reduce the activity of the immune system. A diabetes medicine (metformin). Birth control pills, for women. Steroids to reduce swelling and pain. Working with a mental health care provider, if you experience emotional distress due to this condition. If you have severe symptoms that do not get better with medicine, you may need surgery. Surgery may involve: Using a laser to clear the skin and remove hair follicles. Opening and draining deep sores. Removing the areas of skin that are diseased and scarred. Follow these instructions at home: Medicines  Take over-the-counter and prescription medicines only as told by your health care provider. If you were prescribed an antibiotic medicine, take it as told by your health care provider. Do not stop taking the antibiotic even if your condition improves. Skin care If you have open wounds, cover them with a clean dressing as told by your health care provider. Keep wounds clean by washing them gently with soap and water when you bathe. Do not shave the areas where you get hidradenitis suppurativa. Do not wear deodorant. Wear loose-fitting   clothes. Try to avoid getting overheated or sweaty. If you get sweaty or wet, change into clean, dry clothes as soon  as you can. To help relieve pain and itchiness, cover sore areas with a warm, clean washcloth (warm compress) for 5-10 minutes as often as needed. If told by your health care provider, take a bleach bath twice a week: Fill your bathtub halfway with water. Pour in  cup of unscented household bleach. Soak in the tub for 5-10 minutes. Only soak from the neck down. Avoid water on your face and hair. Shower to rinse off the bleach from your skin. General instructions Learn as much as you can about your disease so that you have an active role in your treatment. Work closely with your health care provider to find treatments that work for you. If you are overweight, work with your health care provider to lose weight as recommended. Do not use any products that contain nicotine or tobacco, such as cigarettes and e-cigarettes. If you need help quitting, ask your health care provider. If you struggle with living with this condition, talk with your health care provider or work with a mental health care provider as recommended. Keep all follow-up visits as told by your health care provider. This is important. Where to find more information Hidradenitis Suppurativa Foundation, Inc.: https://www.hs-foundation.org/ American Academy of Dermatology: https://www.aad.org Contact a health care provider if you have: A flare-up of hidradenitis suppurativa. A fever or chills. Trouble controlling your symptoms at home. Trouble doing your daily activities because of your symptoms. Trouble dealing with emotional problems related to your condition. Summary Hidradenitis suppurativa is a long-term (chronic) skin disease. It is similar to a severe form of acne, but it affects areas of the body where acne would be unusual. The first symptoms are usually painful bumps in the skin, similar to pimples. The condition may only cause mild symptoms, or it may get worse over time (progress). If you have open wounds, cover them  with a clean dressing as told by your health care provider. Keep wounds clean by washing them gently with soap and water when you bathe. Besides skin care, treatment may include medicines, laser treatment, and surgery. This information is not intended to replace advice given to you by your health care provider. Make sure you discuss any questions you have with your health care provider. Document Revised: 05/22/2020 Document Reviewed: 05/22/2020 Elsevier Patient Education  2022 Elsevier Inc.  

## 2021-06-25 NOTE — Progress Notes (Signed)
Subjective:  Patient ID: Lauren Costa, female    DOB: 1996-05-06  Age: 25 y.o. MRN: [id number]  CC: New Patient (Initial Visit)   HPI Lauren Costa is a 25 y.o. year old female who presents today to establish care. She had an abortion 3 months ago but is doing well. She would like to have a preventive exam. Due for Pap smear.  Interval History: She complains of a boil every time she shaves. She was referred to Dermatology to evaluate her Hydradenitis but does not have an appointment until 3 months away.  Both occurred on her breast and her groin. Past Medical History:  Diagnosis Date   Anxiety    as a child   Bilateral breast lump 03/19/2016   Chronic kidney disease    kidney infection    Past Surgical History:  Procedure Laterality Date   DENTAL SURGERY      Family History  Problem Relation Age of Onset   Cancer Maternal Aunt    Cancer Maternal Uncle    Alcohol abuse Neg Hx    Arthritis Neg Hx    Asthma Neg Hx    Birth defects Neg Hx    COPD Neg Hx    Depression Neg Hx    Diabetes Neg Hx    Drug abuse Neg Hx    Early death Neg Hx    Hearing loss Neg Hx    Heart disease Neg Hx    Hyperlipidemia Neg Hx    Hypertension Neg Hx    Kidney disease Neg Hx    Learning disabilities Neg Hx    Mental illness Neg Hx    Mental retardation Neg Hx    Miscarriages / Stillbirths Neg Hx    Stroke Neg Hx    Vision loss Neg Hx    Varicose Veins Neg Hx     Allergies  Allergen Reactions   Peanut-Containing Drug Products Hives    Outpatient Medications Prior to Visit  Medication Sig Dispense Refill   metoCLOPramide (REGLAN) 10 MG tablet Take 1 tablet (10 mg total) by mouth every 8 (eight) hours as needed for nausea. (Patient not taking: Reported on 06/25/2021) 30 tablet 0   No facility-administered medications prior to visit.     ROS Review of Systems  Constitutional:  Negative for activity change, appetite change and fatigue.  HENT:  Negative for congestion, sinus  pressure and sore throat.   Eyes:  Negative for visual disturbance.  Respiratory:  Negative for cough, chest tightness, shortness of breath and wheezing.   Cardiovascular:  Negative for chest pain and palpitations.  Gastrointestinal:  Negative for abdominal distention, abdominal pain and constipation.  Endocrine: Negative for polydipsia.  Genitourinary:  Negative for dysuria and frequency.  Musculoskeletal:  Negative for arthralgias and back pain.  Skin:  Positive for rash.  Neurological:  Negative for tremors, light-headedness and numbness.  Hematological:  Does not bruise/bleed easily.  Psychiatric/Behavioral:  Negative for agitation and behavioral problems.    Objective:  BP 127/81 (BP Location: Left Arm, Patient Position: Sitting, Cuff Size: Normal)   Pulse 85   Ht 5' 5"  (1.651 m)   Wt 234 lb 12.8 oz (106.5 kg)   LMP 02/15/2021   SpO2 100%   Breastfeeding Unknown   BMI 39.07 kg/m   BP/Weight 06/25/2021 03/24/2021 1/56/1537  Systolic BP 943 276 147  Diastolic BP 81 71 78  Wt. (Lbs) 234.8 238 -  BMI 39.07 39.61 -      Physical Exam  Exam conducted with a chaperone present.  Constitutional:      General: She is not in acute distress.    Appearance: She is well-developed. She is not diaphoretic.  HENT:     Head: Normocephalic.     Right Ear: External ear normal.     Left Ear: External ear normal.     Nose: Nose normal.  Eyes:     Conjunctiva/sclera: Conjunctivae normal.     Pupils: Pupils are equal, round, and reactive to light.  Neck:     Vascular: No JVD.  Cardiovascular:     Rate and Rhythm: Normal rate and regular rhythm.     Heart sounds: Normal heart sounds. No murmur heard.   No gallop.  Pulmonary:     Effort: Pulmonary effort is normal. No respiratory distress.     Breath sounds: Normal breath sounds. No wheezing or rales.  Chest:     Chest wall: No tenderness.  Breasts:    Right: Normal. No mass, nipple discharge or tenderness.     Left: Normal. No  mass, nipple discharge or tenderness.  Abdominal:     General: Bowel sounds are normal. There is no distension.     Palpations: Abdomen is soft. There is no mass.     Tenderness: There is no abdominal tenderness.     Hernia: There is no hernia in the left inguinal area or right inguinal area.  Genitourinary:    General: Normal vulva.     Pubic Area: No rash.      Labia:        Right: No rash.        Left: No rash.      Vagina: Vaginal discharge (bloody discharge) present.     Cervix: Normal.     Uterus: Normal.      Adnexa: Right adnexa normal and left adnexa normal.       Right: No tenderness.         Left: No tenderness.    Musculoskeletal:        General: No tenderness. Normal range of motion.     Cervical back: Normal range of motion. No tenderness.  Lymphadenopathy:     Upper Body:     Right upper body: No supraclavicular or axillary adenopathy.     Left upper body: No supraclavicular or axillary adenopathy.  Skin:    General: Skin is warm and dry.     Comments: Furuncles in different stages of healing on both breast and groin  Neurological:     Mental Status: She is alert and oriented to person, place, and time.     Deep Tendon Reflexes: Reflexes are normal and symmetric.    CMP Latest Ref Rng & Units 12/30/2018 04/19/2018 06/21/2017  Glucose 70 - 99 mg/dL 86 83 112(H)  BUN 6 - 20 mg/dL 8 6 9   Creatinine 0.44 - 1.00 mg/dL 0.71 0.55 0.60  Sodium 135 - 145 mmol/L 138 135 137  Potassium 3.5 - 5.1 mmol/L 4.0 3.8 3.6  Chloride 98 - 111 mmol/L 104 104 103  CO2 22 - 32 mmol/L 26 20(L) -  Calcium 8.9 - 10.3 mg/dL 9.3 9.1 -  Total Protein 6.5 - 8.1 g/dL 7.0 7.5 -  Total Bilirubin 0.3 - 1.2 mg/dL 0.5 1.0 -  Alkaline Phos 38 - 126 U/L 75 70 -  AST 15 - 41 U/L 15 16 -  ALT 0 - 44 U/L 14 12 -    Lipid Panel  No results  found for: CHOL, TRIG, HDL, CHOLHDL, VLDL, LDLCALC, LDLDIRECT  CBC    Component Value Date/Time   WBC 8.6 03/24/2021 1107   RBC 4.60 03/24/2021 1107    HGB 12.2 03/24/2021 1107   HGB 10.9 (L) 07/28/2016 1204   HCT 38.5 03/24/2021 1107   HCT 32.5 (L) 07/28/2016 1204   PLT 244 03/24/2021 1107   PLT 181 07/28/2016 1204   MCV 83.7 03/24/2021 1107   MCV 87 07/28/2016 1204   MCH 26.5 03/24/2021 1107   MCHC 31.7 03/24/2021 1107   RDW 14.4 03/24/2021 1107   RDW 13.7 07/28/2016 1204   LYMPHSABS 2.2 04/19/2018 2055   LYMPHSABS 2.3 03/19/2016 1548   MONOABS 0.4 04/19/2018 2055   EOSABS 0.0 04/19/2018 2055   EOSABS 0.1 03/19/2016 1548   BASOSABS 0.0 04/19/2018 2055   BASOSABS 0.0 03/19/2016 1548    No results found for: HGBA1C  Assessment & Plan:  1. Annual physical exam Counseled on 150 minutes of exercise per week, healthy eating (including decreased daily intake of saturated fats, cholesterol, added sugars, sodium), routine healthcare maintenance. - HPV 9-valent vaccine,Recombinat  2. Screening for metabolic disorder - LP+Non-HDL Cholesterol - CMP14+EGFR - Hemoglobin A1c  3. Need for hepatitis C screening test - HCV Ab w Reflex to Quant PCR  4. Screening for cervical cancer - Cytology - PAP  5. Screening for STD (sexually transmitted disease) - Cervicovaginal ancillary only  6. Hidradenitis suppurativa - chlorhexidine (HIBICLENS) 4 % external liquid; Apply topically daily as needed.  Dispense: 120 mL; Refill: 1 - clindamycin (CLEOCIN-T) 1 % external solution; Apply topically 2 (two) times daily.  Dispense: 30 mL; Refill: 1    Meds ordered this encounter  Medications   chlorhexidine (HIBICLENS) 4 % external liquid    Sig: Apply topically daily as needed.    Dispense:  120 mL    Refill:  1   clindamycin (CLEOCIN-T) 1 % external solution    Sig: Apply topically 2 (two) times daily.    Dispense:  30 mL    Refill:  1    Follow-up: Return in about 1 year (around 06/25/2022) for Complete physical exam, 2 month for HPV vaccine.Charlott Rakes, MD, FAAFP. [facility] and [place], [place]   06/25/2021, 12:28 PM

## 2021-06-26 LAB — CMP14+EGFR
ALT: 18 IU/L (ref 0–32)
AST: 15 IU/L (ref 0–40)
Albumin/Globulin Ratio: 1.4 (ref 1.2–2.2)
Albumin: 4.5 g/dL (ref 3.9–5.0)
Alkaline Phosphatase: 99 IU/L (ref 44–121)
BUN/Creatinine Ratio: 14 (ref 9–23)
BUN: 11 mg/dL (ref 6–20)
Bilirubin Total: 0.4 mg/dL (ref 0.0–1.2)
CO2: 22 mmol/L (ref 20–29)
Calcium: 9.4 mg/dL (ref 8.7–10.2)
Chloride: 102 mmol/L (ref 96–106)
Creatinine, Ser: 0.78 mg/dL (ref 0.57–1.00)
Globulin, Total: 3.2 g/dL (ref 1.5–4.5)
Glucose: 94 mg/dL (ref 70–99)
Potassium: 4.5 mmol/L (ref 3.5–5.2)
Sodium: 138 mmol/L (ref 134–144)
Total Protein: 7.7 g/dL (ref 6.0–8.5)
eGFR: 109 mL/min/{1.73_m2} (ref >59)

## 2021-06-26 LAB — LP+NON-HDL CHOLESTEROL
Cholesterol, Total: 201 mg/dL — ABNORMAL HIGH (ref 100–199)
HDL: 49 mg/dL (ref >39)
LDL Chol Calc (NIH): 138 mg/dL — ABNORMAL HIGH (ref 0–99)
Total Non-HDL-Chol (LDL+VLDL): 152 mg/dL — ABNORMAL HIGH (ref 0–129)
Triglycerides: 78 mg/dL (ref 0–149)
VLDL Cholesterol Cal: 14 mg/dL (ref 5–40)

## 2021-06-26 LAB — CERVICOVAGINAL ANCILLARY ONLY
Bacterial Vaginitis (gardnerella): POSITIVE — AB
Candida Glabrata: NEGATIVE
Candida Vaginitis: NEGATIVE
Chlamydia: NEGATIVE
Comment: NEGATIVE
Comment: NEGATIVE
Comment: NEGATIVE
Comment: NEGATIVE
Comment: NEGATIVE
Comment: NORMAL
Neisseria Gonorrhea: NEGATIVE
Trichomonas: NEGATIVE

## 2021-06-26 LAB — HCV AB W REFLEX TO QUANT PCR: HCV Ab: 0.2 s/co ratio (ref 0.0–0.9)

## 2021-06-26 LAB — HCV INTERPRETATION

## 2021-06-26 LAB — HEMOGLOBIN A1C
Est. average glucose Bld gHb Est-mCnc: 117 mg/dL
Hgb A1c MFr Bld: 5.7 % — ABNORMAL HIGH (ref 4.8–5.6)

## 2021-06-27 ENCOUNTER — Other Ambulatory Visit: Payer: Self-pay | Admitting: Family Medicine

## 2021-06-27 LAB — CYTOLOGY - PAP
Adequacy: ABSENT
Diagnosis: NEGATIVE

## 2021-06-27 MED ORDER — METRONIDAZOLE 0.75 % VA GEL: 1.0000 | Freq: Every day | VAGINAL | 0 refills | Status: DC

## 2021-06-28 ENCOUNTER — Telehealth: Payer: Self-pay

## 2021-06-28 NOTE — Telephone Encounter (Signed)
Patient name and DOB has been verified Patient was informed of lab results. Patient had no questions.  

## 2021-06-28 NOTE — Telephone Encounter (Signed)
-----   Message from Hoy Register, MD sent at 06/27/2021 12:31 PM EST ----- Labs reveal prediabetes with an A1c of 5.7.  Working on a low carbohydrate diet, exercise, weight loss is recommended in order to prevent progression to type 2 diabetes mellitus.  Pap smear is normal but she does have a bacterial infection called bacterial vaginosis and I have sent a prescription to her pharmacy.  Other labs are stable.

## 2021-08-09 ENCOUNTER — Encounter (HOSPITAL_COMMUNITY): Payer: Self-pay | Admitting: Emergency Medicine

## 2021-08-09 ENCOUNTER — Ambulatory Visit (HOSPITAL_COMMUNITY)
Admission: EM | Admit: 2021-08-09 | Discharge: 2021-08-09 | Disposition: A | Discharge status: Home / Self Care | Payer: Medicaid Other | Attending: Urgent Care | Admitting: Urgent Care

## 2021-08-09 ENCOUNTER — Other Ambulatory Visit: Payer: Self-pay

## 2021-08-09 DIAGNOSIS — R0981 Nasal congestion: Secondary | ICD-10-CM

## 2021-08-09 DIAGNOSIS — Z20822 Contact with and (suspected) exposure to covid-19: Secondary | ICD-10-CM

## 2021-08-09 DIAGNOSIS — B349 Viral infection, unspecified: Secondary | ICD-10-CM | POA: Diagnosis not present

## 2021-08-09 MED ORDER — LEVOCETIRIZINE DIHYDROCHLORIDE 5 MG PO TABS: 5.0000 mg | ORAL_TABLET | Freq: Every evening | ORAL | 0 refills | Status: DC

## 2021-08-09 NOTE — Discharge Instructions (Signed)
You were swabbed today for covid.  If your test is positive, you would only need to quarantine for an additional two days. You can use a steam vaporizer at night and saline spray to open up your nasal passages. Try Xyzal at night to help clear your sinuses.

## 2021-08-09 NOTE — ED Provider Notes (Signed)
[facility]    CSN: [id number] Arrival date & time: 08/09/21  1430      History   Chief Complaint Chief Complaint  Patient presents with   Cough   Nasal Congestion    HPI Lauren Costa is a 25 y.o. female.   Pleasant 25 year old female presents today with concerns of an 8-day history of nasal congestion and nasal discharge.  She states it was very severe at the beginning of the week, but has started improving.  She has been taking over-the-counter medications with minimal relief.  Her main concern however she just found out that a close contact just tested positive for COVID.  Patient works from home and has not been around anyone else other than her 31-year-old daughter.  Patient denies severe shortness of breath, mucous production, chest pain, fevers.   Cough Associated symptoms: rhinorrhea    Past Medical History:  Diagnosis Date   Anxiety    as a child   Bilateral breast lump 03/19/2016   Chronic kidney disease    kidney infection    Patient Active Problem List   Diagnosis Date Noted   Morning sickness 01/11/2019   Intrauterine pregnancy 04/19/2018   Nausea/vomiting in pregnancy 04/19/2018   Bacterial vaginitis 04/19/2018   Mild tetrahydrocannabinol (THC) abuse 07/17/2016   Supervision of normal first pregnancy 03/19/2016   Bilateral breast lump 03/19/2016    Past Surgical History:  Procedure Laterality Date   DENTAL SURGERY      OB History     Gravida  4   Para  1   Term  1   Preterm      AB  2   Living  1      SAB  0   IAB  2   Ectopic      Multiple  0   Live Births  1            Home Medications    Prior to Admission medications   Medication Sig Start Date End Date Taking? Authorizing Provider  levocetirizine (XYZAL) 5 MG tablet Take 1 tablet (5 mg total) by mouth every evening. 08/09/21  Yes Melaine Mcphee L, PA  chlorhexidine (HIBICLENS) 4 % external liquid Apply topically daily as needed. 06/25/21   Hoy Register, MD  clindamycin (CLEOCIN-T) 1 % external solution Apply topically 2 (two) times daily. 06/25/21   Hoy Register, MD  metoCLOPramide (REGLAN) 10 MG tablet Take 1 tablet (10 mg total) by mouth every 8 (eight) hours as needed for nausea. Patient not taking: Reported on 06/25/2021 03/24/21   Judeth Horn, NP  metroNIDAZOLE (METROGEL VAGINAL) 0.75 % vaginal gel Place 1 Applicatorful vaginally at bedtime. 06/27/21   Hoy Register, MD  promethazine (PHENERGAN) 12.5 MG tablet Take 1 tablet (12.5 mg total) by mouth every 6 (six) hours as needed for up to 30 days for nausea or vomiting. Can use vaginally if unable to keep anything down 01/11/19 10/05/19  Raelyn Mora, CNM    Family History Family History  Problem Relation Age of Onset   Cancer Maternal Aunt    Cancer Maternal Uncle    Alcohol abuse Neg Hx    Arthritis Neg Hx    Asthma Neg Hx    Birth defects Neg Hx    COPD Neg Hx    Depression Neg Hx    Diabetes Neg Hx    Drug abuse Neg Hx    Early death Neg Hx    Hearing loss Neg Hx  Heart disease Neg Hx    Hyperlipidemia Neg Hx    Hypertension Neg Hx    Kidney disease Neg Hx    Learning disabilities Neg Hx    Mental illness Neg Hx    Mental retardation Neg Hx    Miscarriages / Stillbirths Neg Hx    Stroke Neg Hx    Vision loss Neg Hx    Varicose Veins Neg Hx     Social History Social History   Tobacco Use   Smoking status: Some Days    Types: Cigarettes    Last attempt to quit: 04/19/2016    Years since quitting: 5.3   Smokeless tobacco: Former  Building services engineer Use: Never used  Substance Use Topics   Alcohol use: No   Drug use: Not Currently    Types: Marijuana     Allergies   Peanut-containing drug products   Review of Systems Review of Systems  Constitutional: Negative.   HENT:  Positive for congestion and rhinorrhea.   Eyes: Negative.   Respiratory:  Positive for cough.   Cardiovascular: Negative.   Gastrointestinal: Negative.    Endocrine: Negative.   Genitourinary: Negative.     Physical Exam Triage Vital Signs ED Triage Vitals  Enc Vitals Group     BP 08/09/21 1649 126/84     Pulse Rate 08/09/21 1649 88     Resp 08/09/21 1649 17     Temp 08/09/21 1649 98 F (36.7 C)     Temp Source 08/09/21 1649 Oral     SpO2 08/09/21 1649 96 %     Weight --      Height --      Head Circumference --      Peak Flow --      Pain Score 08/09/21 1650 0     Pain Loc --      Pain Edu? --      Excl. in GC? --    No data found.  Updated Vital Signs BP 126/84 (BP Location: Right Arm)    Pulse 88    Temp 98 F (36.7 C) (Oral)    Resp 17    LMP 07/22/2021    SpO2 96%   Visual Acuity Right Eye Distance:   Left Eye Distance:   Bilateral Distance:    Right Eye Near:   Left Eye Near:    Bilateral Near:     Physical Exam Vitals and nursing note reviewed. Exam conducted with a chaperone present.  Constitutional:      General: She is not in acute distress.    Appearance: Normal appearance. She is well-developed and normal weight. She is not ill-appearing, toxic-appearing or diaphoretic.  HENT:     Head: Normocephalic and atraumatic.     Right Ear: Tympanic membrane, ear canal and external ear normal. There is no impacted cerumen.     Left Ear: Tympanic membrane, ear canal and external ear normal. There is no impacted cerumen.     Nose: Congestion present. No rhinorrhea.     Mouth/Throat:     Mouth: Mucous membranes are moist.     Pharynx: No oropharyngeal exudate or posterior oropharyngeal erythema.  Eyes:     General: No scleral icterus.       Right eye: No discharge.        Left eye: No discharge.     Extraocular Movements: Extraocular movements intact.     Conjunctiva/sclera: Conjunctivae normal.     Pupils: Pupils are equal,  round, and reactive to light.  Cardiovascular:     Rate and Rhythm: Normal rate and regular rhythm.     Pulses: Normal pulses.     Heart sounds: Normal heart sounds. No murmur heard.    No friction rub. No gallop.  Pulmonary:     Effort: Pulmonary effort is normal. No respiratory distress.     Breath sounds: Normal breath sounds. No wheezing, rhonchi or rales.  Chest:     Chest wall: No tenderness.  Abdominal:     Palpations: Abdomen is soft.  Musculoskeletal:        General: No swelling.     Cervical back: Normal range of motion and neck supple. No rigidity or tenderness.  Lymphadenopathy:     Cervical: No cervical adenopathy.  Skin:    General: Skin is warm and dry.     Capillary Refill: Capillary refill takes less than 2 seconds.     Findings: No erythema or rash.  Neurological:     General: No focal deficit present.     Mental Status: She is alert.  Psychiatric:        Mood and Affect: Mood normal.     UC Treatments / Results  Labs (all labs ordered are listed, but only abnormal results are displayed) Labs Reviewed  SARS CORONAVIRUS 2 (TAT 6-24 HRS)    EKG   Radiology No results found.  Procedures Procedures (including critical care time)  Medications Ordered in UC Medications - No data to display  Initial Impression / Assessment and Plan / UC Course  I have reviewed the triage vital signs and the nursing notes.  Pertinent labs & imaging results that were available during my care of the patient were reviewed by me and considered in my medical decision making (see chart for details).     Nasal congestion - supportive measures. Add antihistamine, steam, saline spray. Viral syndrome - supportive care. Your symptoms appear to be clearing up. Exposure to covid - pt understands that if positive, she would only need to quarantine for two additional days to complete a full 10 day quarantine. Pt has not been vaccinated. No antiviral would be needed if positive.   Final Clinical Impressions(s) / UC Diagnoses   Final diagnoses:  Nasal congestion  Viral syndrome  Exposure to COVID-19 virus     Discharge Instructions      You were swabbed  today for covid.  If your test is positive, you would only need to quarantine for an additional two days. You can use a steam vaporizer at night and saline spray to open up your nasal passages. Try Xyzal at night to help clear your sinuses.     ED Prescriptions     Medication Sig Dispense Auth. Provider   levocetirizine (XYZAL) 5 MG tablet Take 1 tablet (5 mg total) by mouth every evening. 30 tablet Glenn Christo L, [place]      PDMP not reviewed this encounter.   Maretta Bees, [place] 08/09/21 [id number]

## 2021-08-09 NOTE — ED Triage Notes (Signed)
Pt presents with cough, congestion, and runny nose xs 8 days.

## 2021-08-10 LAB — SARS CORONAVIRUS 2 (TAT 6-24 HRS): SARS Coronavirus 2: POSITIVE — AB

## 2021-08-26 ENCOUNTER — Ambulatory Visit: Payer: Medicaid Other

## 2022-03-05 ENCOUNTER — Inpatient Hospital Stay (HOSPITAL_COMMUNITY)
Admission: AD | Admit: 2022-03-05 | Discharge: 2022-03-06 | Disposition: A | Discharge status: Home / Self Care | Payer: Medicaid Other | Attending: Obstetrics & Gynecology | Admitting: Obstetrics & Gynecology

## 2022-03-05 ENCOUNTER — Encounter (HOSPITAL_COMMUNITY): Payer: Self-pay | Admitting: Obstetrics & Gynecology

## 2022-03-05 DIAGNOSIS — O219 Vomiting of pregnancy, unspecified: Secondary | ICD-10-CM | POA: Insufficient documentation

## 2022-03-05 DIAGNOSIS — O26831 Pregnancy related renal disease, first trimester: Secondary | ICD-10-CM | POA: Insufficient documentation

## 2022-03-05 DIAGNOSIS — Z3A01 Less than 8 weeks gestation of pregnancy: Secondary | ICD-10-CM | POA: Diagnosis present

## 2022-03-05 DIAGNOSIS — O99281 Endocrine, nutritional and metabolic diseases complicating pregnancy, first trimester: Secondary | ICD-10-CM | POA: Diagnosis not present

## 2022-03-05 DIAGNOSIS — N189 Chronic kidney disease, unspecified: Secondary | ICD-10-CM | POA: Insufficient documentation

## 2022-03-05 DIAGNOSIS — O26891 Other specified pregnancy related conditions, first trimester: Secondary | ICD-10-CM | POA: Insufficient documentation

## 2022-03-05 DIAGNOSIS — E860 Dehydration: Secondary | ICD-10-CM | POA: Diagnosis not present

## 2022-03-05 DIAGNOSIS — F17210 Nicotine dependence, cigarettes, uncomplicated: Secondary | ICD-10-CM | POA: Diagnosis not present

## 2022-03-05 DIAGNOSIS — R109 Unspecified abdominal pain: Secondary | ICD-10-CM | POA: Insufficient documentation

## 2022-03-05 DIAGNOSIS — N898 Other specified noninflammatory disorders of vagina: Secondary | ICD-10-CM

## 2022-03-05 DIAGNOSIS — O99321 Drug use complicating pregnancy, first trimester: Secondary | ICD-10-CM | POA: Insufficient documentation

## 2022-03-05 LAB — WET PREP, GENITAL
Clue Cells Wet Prep HPF POC: NONE SEEN
Sperm: NONE SEEN
Trich, Wet Prep: NONE SEEN
WBC, Wet Prep HPF POC: >=10 — AB (ref <10)
Yeast Wet Prep HPF POC: NONE SEEN

## 2022-03-05 LAB — URINALYSIS, ROUTINE W REFLEX MICROSCOPIC
Bilirubin Urine: NEGATIVE
Glucose, UA: NEGATIVE mg/dL
Hgb urine dipstick: NEGATIVE
Ketones, ur: NEGATIVE mg/dL
Nitrite: NEGATIVE
Protein, ur: 30 mg/dL — AB
Specific Gravity, Urine: 1.032 — ABNORMAL HIGH (ref 1.005–1.030)
pH: 6 (ref 5.0–8.0)

## 2022-03-05 LAB — POCT PREGNANCY, URINE: Preg Test, Ur: POSITIVE — AB

## 2022-03-05 MED ORDER — ONDANSETRON 4 MG PO TBDP: 4.0000 mg | ORAL_TABLET | Freq: Four times a day (QID) | ORAL | 0 refills | Status: DC | PRN

## 2022-03-05 MED ORDER — ONDANSETRON HCL 4 MG/2ML IJ SOLN
4.0000 mg | Freq: Once | INTRAMUSCULAR | Status: AC
  Administered 2022-03-05: 4 mg via INTRAVENOUS
  Filled 2022-03-05: qty 2

## 2022-03-05 MED ORDER — LACTATED RINGERS IV SOLN: Freq: Once | INTRAVENOUS | Status: AC

## 2022-03-05 MED ORDER — TERCONAZOLE 0.4 % VA CREA: 1.0000 | TOPICAL_CREAM | Freq: Every day | VAGINAL | 0 refills | Status: DC

## 2022-03-05 NOTE — MAU Provider Note (Signed)
Chief Complaint: Abdominal Pain, Nausea, and Emesis   Event Date/Time   First Provider Initiated Contact with Patient 03/05/22 2231        SUBJECTIVE HPI: Lauren Costa is a 26 y.o. [id number] at [redacted]w[redacted]d by LMP who presents to maternity admissions reporting nausea and vomiting tonight.  Could not find nausea meds.  Feels weak. Also has vaginal itching when she wipes, no itching other times. Large amount of discharge. She denies vaginal bleeding, urinary symptoms, h/a, dizziness, or fever/chills.    Emesis  This is a recurrent problem. The current episode started today. There has been no fever. Pertinent negatives include no chest pain, chills, diarrhea, dizziness, fever or myalgias. She has tried nothing (ran out of meds) for the symptoms.   RN Note: Lauren Costa is a 26 y.o. at Unknown here in MAU reporting: N/V and weak since yesterday, and abnormal discharge and vaginal irritation. Pt states she has not been able to keep anything down since last night around 2200. Pt states she attempt to eat twice with a P&J and fruit but vomited after. Pt reports 6-7 episodes of emesis. Pt has prescribed N/V medication but unable to find to take. Pt states she is concerned about yeast infection due to discharge and discomfort. Pt states she has felt very warm today unsure of fever. Pt denies VB, cramping currently, and LOF.  LMP: 01/16/2022  Past Medical History:  Diagnosis Date   Anxiety    as a child   Bilateral breast lump 03/19/2016   Chronic kidney disease    kidney infection   Past Surgical History:  Procedure Laterality Date   DENTAL SURGERY     Social History   Socioeconomic History   Marital status: Single    Spouse name: Not on file   Number of children: Not on file   Years of education: Not on file   Highest education level: Not on file  Occupational History   Not on file  Tobacco Use   Smoking status: Some Days    Types: Cigarettes    Last attempt to quit: 04/19/2016    Years since  quitting: 5.8   Smokeless tobacco: Former  Building services engineer Use: Never used  Substance and Sexual Activity   Alcohol use: No   Drug use: Not Currently    Types: Marijuana   Sexual activity: Yes    Partners: Male    Birth control/protection: None  Other Topics Concern   Not on file  Social History Narrative   Not on file   Social Determinants of Health   Financial Resource Strain: Not on file  Food Insecurity: Not on file  Transportation Needs: Not on file  Physical Activity: Not on file  Stress: Not on file  Social Connections: Not on file  Intimate Partner Violence: Not on file   No current facility-administered medications on file prior to encounter.   Current Outpatient Medications on File Prior to Encounter  Medication Sig Dispense Refill   chlorhexidine (HIBICLENS) 4 % external liquid Apply topically daily as needed. 120 mL 1   clindamycin (CLEOCIN-T) 1 % external solution Apply topically 2 (two) times daily. 30 mL 1   levocetirizine (XYZAL) 5 MG tablet Take 1 tablet (5 mg total) by mouth every evening. 30 tablet 0   metoCLOPramide (REGLAN) 10 MG tablet Take 1 tablet (10 mg total) by mouth every 8 (eight) hours as needed for nausea. (Patient not taking: Reported on 06/25/2021) 30 tablet 0  metroNIDAZOLE (METROGEL VAGINAL) 0.75 % vaginal gel Place 1 Applicatorful vaginally at bedtime. 70 g 0   [DISCONTINUED] promethazine (PHENERGAN) 12.5 MG tablet Take 1 tablet (12.5 mg total) by mouth every 6 (six) hours as needed for up to 30 days for nausea or vomiting. Can use vaginally if unable to keep anything down 30 tablet 0   Allergies  Allergen Reactions   Peanut-Containing Drug Products Hives    I have reviewed patient's Past Medical Hx, Surgical Hx, Family Hx, Social Hx, medications and allergies.   ROS:  Review of Systems  Constitutional:  Negative for chills and fever.  Cardiovascular:  Negative for chest pain.  Gastrointestinal:  Positive for vomiting. Negative  for diarrhea.  Musculoskeletal:  Negative for myalgias.  Neurological:  Negative for dizziness.   Review of Systems  Other systems negative   Physical Exam  Physical Exam Patient Vitals for the past 24 hrs:  BP Temp Temp src Pulse Resp SpO2 Height Weight  03/05/22 2218 134/71 98.3 F (36.8 C) Oral 67 18 100 % 5\' 5"  (1.651 m) 106.6 kg   Constitutional: Well-developed, well-nourished female in no acute distress.  Cardiovascular: normal rate Respiratory: normal effort GI: Abd soft, non-tender.  MS: Extremities nontender, no edema, normal ROM Neurologic: Alert and oriented x 4.  GU: Neg CVAT.   LAB RESULTS Results for orders placed or performed during the hospital encounter of 03/05/22 (from the past 24 hour(s))  Pregnancy, urine POC     Status: Abnormal   Collection Time: 03/05/22 10:10 PM  Result Value Ref Range   Preg Test, Ur POSITIVE (A) NEGATIVE  Urinalysis, Routine w reflex microscopic     Status: Abnormal   Collection Time: 03/05/22 10:22 PM  Result Value Ref Range   Color, Urine YELLOW YELLOW   APPearance HAZY (A) CLEAR   Specific Gravity, Urine 1.032 (H) 1.005 - 1.030   pH 6.0 5.0 - 8.0   Glucose, UA NEGATIVE NEGATIVE mg/dL   Hgb urine dipstick NEGATIVE NEGATIVE   Bilirubin Urine NEGATIVE NEGATIVE   Ketones, ur NEGATIVE NEGATIVE mg/dL   Protein, ur 30 (A) NEGATIVE mg/dL   Nitrite NEGATIVE NEGATIVE   Leukocytes,Ua LARGE (A) NEGATIVE   RBC / HPF 0-5 0 - 5 RBC/hpf   WBC, UA 0-5 0 - 5 WBC/hpf   Bacteria, UA RARE (A) NONE SEEN   Squamous Epithelial / LPF 11-20 0 - 5   Mucus PRESENT   Wet prep, genital     Status: Abnormal   Collection Time: 03/05/22 11:16 PM  Result Value Ref Range   Yeast Wet Prep HPF POC NONE SEEN NONE SEEN   Trich, Wet Prep NONE SEEN NONE SEEN   Clue Cells Wet Prep HPF POC NONE SEEN NONE SEEN   WBC, Wet Prep HPF POC >=10 (A) <10   Sperm NONE SEEN      IMAGING No results found.  MAU Management/MDM: I have reviewed the triage vital  signs and the nursing notes.   Pertinent labs & imaging results that were available during my care of the patient were reviewed by me and considered in my medical decision making (see chart for details).      I have reviewed her medical records including past results, notes and treatments.   Ordered labs.  Wet prep negative.  Urine has leukocytes, sent to culture.  Preg test positive    Treatments in MAU included IV fluids and zofran which resolved her nausea.  Able to tolerate PO intake afterward. 03/07/22  ASSESSMENT Pregnancy at [redacted]w[redacted]d Nausea and vomiting Mild dehydration Vaginal itching  PLAN Discharge home Rx Terazol for presumptive vaginal yeast Rx Zofran for nausea at home Encouraged to start prenatal care  Pt stable at time of discharge. Encouraged to return here if she develops worsening of symptoms, increase in pain, fever, or other concerning symptoms.    Wynelle Bourgeois CNM, MSN Certified Nurse-Midwife 03/05/2022  10:31 PM

## 2022-03-05 NOTE — MAU Note (Signed)
.  Lauren Costa is a 26 y.o. at Unknown here in MAU reporting: N/V and weak since yesterday, and abnormal discharge and vaginal irritation. Pt states she has not been able to keep anything down since last night around 2200. Pt states she attempt to eat twice with a P&J and fruit but vomited after. Pt reports 6-7 episodes of emesis. Pt has prescribed N/V medication but unable to find to take. Pt states she is concerned about yeast infection due to discharge and discomfort. Pt states she has felt very warm today unsure of fever. Pt denies VB, cramping currently, and LOF.  LMP: 01/16/2022 Onset of complaint: yesterday 2200 Pain score: 0/10 Vitals:   03/05/22 2218  BP: 134/71  Pulse: 67  Resp: 18  Temp: 98.3 F (36.8 C)  SpO2: 100%      Lab orders placed from triage:  UPT, UA

## 2022-03-06 ENCOUNTER — Encounter: Payer: Self-pay | Admitting: Advanced Practice Midwife

## 2022-03-06 DIAGNOSIS — Z3A01 Less than 8 weeks gestation of pregnancy: Secondary | ICD-10-CM

## 2022-03-06 DIAGNOSIS — O210 Mild hyperemesis gravidarum: Secondary | ICD-10-CM

## 2022-03-06 DIAGNOSIS — E860 Dehydration: Secondary | ICD-10-CM

## 2022-03-06 DIAGNOSIS — O219 Vomiting of pregnancy, unspecified: Secondary | ICD-10-CM

## 2022-03-06 DIAGNOSIS — O98219 Gonorrhea complicating pregnancy, unspecified trimester: Secondary | ICD-10-CM | POA: Insufficient documentation

## 2022-03-06 DIAGNOSIS — N898 Other specified noninflammatory disorders of vagina: Secondary | ICD-10-CM

## 2022-03-06 DIAGNOSIS — O26891 Other specified pregnancy related conditions, first trimester: Secondary | ICD-10-CM

## 2022-03-06 LAB — GC/CHLAMYDIA PROBE AMP (~~LOC~~) NOT AT ARMC
Chlamydia: NEGATIVE
Comment: NEGATIVE
Comment: NORMAL
Neisseria Gonorrhea: POSITIVE — AB

## 2022-03-06 LAB — CULTURE, OB URINE

## 2022-03-07 ENCOUNTER — Other Ambulatory Visit: Payer: Self-pay

## 2022-03-07 ENCOUNTER — Emergency Department (HOSPITAL_BASED_OUTPATIENT_CLINIC_OR_DEPARTMENT_OTHER)
Admission: EM | Admit: 2022-03-07 | Discharge: 2022-03-07 | Disposition: A | Discharge status: Home / Self Care | Payer: Medicaid Other | Attending: Emergency Medicine | Admitting: Emergency Medicine

## 2022-03-07 ENCOUNTER — Telehealth (HOSPITAL_BASED_OUTPATIENT_CLINIC_OR_DEPARTMENT_OTHER): Payer: Self-pay | Admitting: Advanced Practice Midwife

## 2022-03-07 DIAGNOSIS — A549 Gonococcal infection, unspecified: Secondary | ICD-10-CM | POA: Insufficient documentation

## 2022-03-07 DIAGNOSIS — O26891 Other specified pregnancy related conditions, first trimester: Secondary | ICD-10-CM | POA: Diagnosis not present

## 2022-03-07 DIAGNOSIS — Z3A01 Less than 8 weeks gestation of pregnancy: Secondary | ICD-10-CM | POA: Diagnosis not present

## 2022-03-07 DIAGNOSIS — B951 Streptococcus, group B, as the cause of diseases classified elsewhere: Secondary | ICD-10-CM | POA: Insufficient documentation

## 2022-03-07 DIAGNOSIS — E860 Dehydration: Secondary | ICD-10-CM | POA: Diagnosis not present

## 2022-03-07 DIAGNOSIS — O2341 Unspecified infection of urinary tract in pregnancy, first trimester: Secondary | ICD-10-CM

## 2022-03-07 DIAGNOSIS — O210 Mild hyperemesis gravidarum: Secondary | ICD-10-CM | POA: Diagnosis not present

## 2022-03-07 MED ORDER — CEFADROXIL 500 MG PO CAPS: 500.0000 mg | ORAL_CAPSULE | Freq: Two times a day (BID) | ORAL | 0 refills | Status: AC

## 2022-03-07 MED ORDER — CEFTRIAXONE SODIUM 500 MG IJ SOLR
500.0000 mg | Freq: Once | INTRAMUSCULAR | Status: AC
  Administered 2022-03-07: 500 mg via INTRAMUSCULAR
  Filled 2022-03-07: qty 500

## 2022-03-07 MED ORDER — STERILE WATER FOR INJECTION IJ SOLN
INTRAMUSCULAR | Status: AC
  Administered 2022-03-07: 10 mL
  Filled 2022-03-07: qty 10

## 2022-03-07 NOTE — ED Provider Notes (Signed)
[facility] EMERGENCY DEPT Provider Note   CSN: [id number] Arrival date & time: 03/07/22  3810     History  Chief Complaint  Patient presents with   Exposure to STD    Lauren Costa is a 26 y.o. female.  Is here for treatment for gonorrhea.  Tested positive for gonorrhea after having evaluation at MAU the other day.  Patient [redacted] weeks pregnant.  Denies any nausea, vomiting, abdominal pain, vaginal bleeding, vaginal discharge.  Chlamydia test was negative.  The history is provided by the patient.       Home Medications Prior to Admission medications   Medication Sig Start Date End Date Taking? Authorizing Provider  chlorhexidine (HIBICLENS) 4 % external liquid Apply topically daily as needed. 06/25/21   Hoy Register, MD  clindamycin (CLEOCIN-T) 1 % external solution Apply topically 2 (two) times daily. 06/25/21   Hoy Register, MD  levocetirizine (XYZAL) 5 MG tablet Take 1 tablet (5 mg total) by mouth every evening. 08/09/21   Crain, Whitney L, PA  metoCLOPramide (REGLAN) 10 MG tablet Take 1 tablet (10 mg total) by mouth every 8 (eight) hours as needed for nausea. Patient not taking: Reported on 06/25/2021 03/24/21   Judeth Horn, NP  ondansetron (ZOFRAN-ODT) 4 MG disintegrating tablet Take 1 tablet (4 mg total) by mouth every 6 (six) hours as needed for nausea. 03/05/22   Aviva Signs, CNM  terconazole (TERAZOL 7) 0.4 % vaginal cream Place 1 applicator vaginally at bedtime. 03/05/22   Aviva Signs, CNM  promethazine (PHENERGAN) 12.5 MG tablet Take 1 tablet (12.5 mg total) by mouth every 6 (six) hours as needed for up to 30 days for nausea or vomiting. Can use vaginally if unable to keep anything down 01/11/19 10/05/19  Raelyn Mora, CNM      Allergies    Peanut-containing drug products    Review of Systems   Review of Systems  Physical Exam Updated Vital Signs LMP 01/16/2022  Physical Exam HENT:     Head: Normocephalic.  Eyes:     Pupils:  Pupils are equal, round, and reactive to light.  Abdominal:     Tenderness: There is no abdominal tenderness.  Skin:    General: Skin is warm.  Neurological:     Mental Status: She is alert.     ED Results / Procedures / Treatments   Labs (all labs ordered are listed, but only abnormal results are displayed) Labs Reviewed - No data to display  EKG None  Radiology No results found.  Procedures Procedures    Medications Ordered in ED Medications  cefTRIAXone (ROCEPHIN) injection 500 mg (has no administration in time range)    ED Course/ Medical Decision Making/ A&P                           Medical Decision Making Risk Prescription drug management.   Lauren Costa is here for treatment for positive gonorrhea test.  She is overall asymptomatic at this time.  She was tested several days ago at the MAU after being there for morning sickness.  She [redacted] weeks pregnant.  Overall she is not having any vaginal bleeding, fever, chills.  No abdominal pain.  Treated with a dose of Rocephin.  Educated about STDs.  Discharged in good condition.  This chart was dictated using voice recognition software.  Despite best efforts to proofread,  errors can occur which can change the documentation meaning.  Final Clinical Impression(s) / ED Diagnoses Final diagnoses:  Gonorrhea    Rx / DC Orders ED Discharge Orders     None         Virgina Norfolk, DO 03/07/22 4235

## 2022-03-07 NOTE — Telephone Encounter (Signed)
Called patient because urine culture from MAU visit on 03/05/22 is positive for GBS UTI.  Duricef 500 mg BID x 7 days sent to [facility] pharmacy.  Pt answered and reviewed results of UTI.  Pt states understanding and will start medication today. Pt was at [facility] ED this morning and received injection of  Rocephin for positive gonorrhea on 03/05/22.  Pt to complete week of abx as better treatment for UTI.  Pt has prenatal appt in September. Return to MAU for any emergencies.

## 2022-03-07 NOTE — ED Notes (Signed)
Dr. Lockie Mola assessing patient in triage.

## 2022-03-07 NOTE — ED Triage Notes (Signed)
Patient arrives with complaints of testing positive for gonorrhea. Patient was evaluated at women's due to being [redacted] weeks pregnant having increased vaginal discharge. Patient is also requesting a ultrasound due to increased cramping. No bleeding

## 2022-03-12 ENCOUNTER — Inpatient Hospital Stay (HOSPITAL_COMMUNITY)
Admission: AD | Admit: 2022-03-12 | Discharge: 2022-03-13 | Disposition: A | Discharge status: Home / Self Care | Payer: Medicaid Other | Attending: Obstetrics & Gynecology | Admitting: Obstetrics & Gynecology

## 2022-03-12 ENCOUNTER — Encounter (HOSPITAL_COMMUNITY): Payer: Self-pay | Admitting: Obstetrics & Gynecology

## 2022-03-12 ENCOUNTER — Ambulatory Visit: Payer: Self-pay | Admitting: *Deleted

## 2022-03-12 ENCOUNTER — Inpatient Hospital Stay (HOSPITAL_COMMUNITY): Payer: Medicaid Other

## 2022-03-12 DIAGNOSIS — O3481 Maternal care for other abnormalities of pelvic organs, first trimester: Secondary | ICD-10-CM | POA: Insufficient documentation

## 2022-03-12 DIAGNOSIS — O26831 Pregnancy related renal disease, first trimester: Secondary | ICD-10-CM | POA: Diagnosis not present

## 2022-03-12 DIAGNOSIS — Z3A01 Less than 8 weeks gestation of pregnancy: Secondary | ICD-10-CM | POA: Insufficient documentation

## 2022-03-12 DIAGNOSIS — R1030 Lower abdominal pain, unspecified: Secondary | ICD-10-CM | POA: Insufficient documentation

## 2022-03-12 DIAGNOSIS — O26891 Other specified pregnancy related conditions, first trimester: Secondary | ICD-10-CM | POA: Insufficient documentation

## 2022-03-12 DIAGNOSIS — O99321 Drug use complicating pregnancy, first trimester: Secondary | ICD-10-CM | POA: Insufficient documentation

## 2022-03-12 DIAGNOSIS — O219 Vomiting of pregnancy, unspecified: Secondary | ICD-10-CM | POA: Diagnosis not present

## 2022-03-12 LAB — COMPREHENSIVE METABOLIC PANEL
ALT: 19 U/L (ref 0–44)
AST: 18 U/L (ref 15–41)
Albumin: 3.8 g/dL (ref 3.5–5.0)
Alkaline Phosphatase: 72 U/L (ref 38–126)
Anion gap: 12 (ref 5–15)
BUN: 7 mg/dL (ref 6–20)
CO2: 22 mmol/L (ref 22–32)
Calcium: 9.3 mg/dL (ref 8.9–10.3)
Chloride: 101 mmol/L (ref 98–111)
Creatinine, Ser: 0.67 mg/dL (ref 0.44–1.00)
GFR, Estimated: >60 mL/min (ref >60)
Glucose, Bld: 81 mg/dL (ref 70–99)
Potassium: 3 mmol/L — ABNORMAL LOW (ref 3.5–5.1)
Sodium: 135 mmol/L (ref 135–145)
Total Bilirubin: 1.1 mg/dL (ref 0.3–1.2)
Total Protein: 7.6 g/dL (ref 6.5–8.1)

## 2022-03-12 LAB — CBC
HCT: 39 % (ref 36.0–46.0)
Hemoglobin: 12.9 g/dL (ref 12.0–15.0)
MCH: 26.7 pg (ref 26.0–34.0)
MCHC: 33.1 g/dL (ref 30.0–36.0)
MCV: 80.6 fL (ref 80.0–100.0)
Platelets: 245 10*3/uL (ref 150–400)
RBC: 4.84 MIL/uL (ref 3.87–5.11)
RDW: 14 % (ref 11.5–15.5)
WBC: 9.6 10*3/uL (ref 4.0–10.5)
nRBC: 0 % (ref 0.0–0.2)

## 2022-03-12 LAB — HCG, QUANTITATIVE, PREGNANCY: hCG, Beta Chain, Quant, S: 120144 m[IU]/mL — ABNORMAL HIGH (ref <5)

## 2022-03-12 LAB — URINALYSIS, ROUTINE W REFLEX MICROSCOPIC
Bacteria, UA: NONE SEEN
Bilirubin Urine: NEGATIVE
Glucose, UA: NEGATIVE mg/dL
Hgb urine dipstick: NEGATIVE
Ketones, ur: 80 mg/dL — AB
Leukocytes,Ua: NEGATIVE
Nitrite: NEGATIVE
Protein, ur: 30 mg/dL — AB
Specific Gravity, Urine: 1.032 — ABNORMAL HIGH (ref 1.005–1.030)
pH: 5 (ref 5.0–8.0)

## 2022-03-12 MED ORDER — METOCLOPRAMIDE HCL 5 MG/ML IJ SOLN
10.0000 mg | Freq: Once | INTRAMUSCULAR | Status: AC
  Administered 2022-03-12: 10 mg via INTRAVENOUS
  Filled 2022-03-12: qty 2

## 2022-03-12 MED ORDER — SCOPOLAMINE 1 MG/3DAYS TD PT72: 1.0000 | MEDICATED_PATCH | TRANSDERMAL | 0 refills | Status: DC

## 2022-03-12 MED ORDER — FAMOTIDINE IN NACL 20-0.9 MG/50ML-% IV SOLN
20.0000 mg | Freq: Once | INTRAVENOUS | Status: AC
  Administered 2022-03-12: 20 mg via INTRAVENOUS
  Filled 2022-03-12: qty 50

## 2022-03-12 MED ORDER — LACTATED RINGERS IV BOLUS
1000.0000 mL | Freq: Once | INTRAVENOUS | Status: AC
  Administered 2022-03-12: 1000 mL via INTRAVENOUS

## 2022-03-12 NOTE — MAU Note (Signed)
..  Lauren Costa is a 26 y.o. at [redacted]w[redacted]d here in MAU reporting: she was given medication for a UTI and can not keep her medication down. She was given zofran but it has not helped. Reports she has vomited over 20 times today.  Has abdominal pain from vomiting so much.   Onset of complaint: last week before last MAU visit. Got worse on Sunday.  Pain score: 8/10 abdominal pain.  Vitals:   03/12/22 1921  BP: 127/80  Pulse: 67  Resp: 16  Temp: 99.3 F (37.4 C)  SpO2: 100%     FHT:not indicated for gestation Lab orders placed from triage: UI

## 2022-03-12 NOTE — Telephone Encounter (Signed)
  Chief Complaint: vomiting for several days.  Can't keep medications down given to her in ED.   [redacted] weeks pregnant also Symptoms: vomiting, not eating for several days, weak, dizzy, has not urinated today Frequency: Since Thur. Pertinent Negatives: Patient denies being able to keep any of her medications down.    Disposition: [x] ED /[] Urgent Care (no appt availability in office) / [] Appointment(In office/virtual)/ []  [place] [facility]/ [] Home Care/ [] Refused Recommended Disposition /[] [place] [facility]/ []  Follow-up with PCP Additional Notes: Pt was agreeable to returning to the ED.   She is going to call her mother and see if she can take pt if not I instructed her to call 911.   Pt was agreeable to this plan.   She does not have a PCP and doesn't have appt. With OB dr. For a few weeks out.

## 2022-03-12 NOTE — Discharge Instructions (Signed)
Sea-Band Nausea Relief are products designed specifically to alleviate the symptoms of nausea and vomiting

## 2022-03-12 NOTE — Telephone Encounter (Signed)
Reason for Disposition  [1] SEVERE vomiting (e.g., 6 or more times/day) AND [2] present > 8 hours (Exception: Patient sounds well, is drinking liquids, does not sound dehydrated, and vomiting has lasted less than 24 hours.)    Also [redacted] weeks pregnant and has a UTI  Answer Assessment - Initial Assessment Questions 1. NAME of MEDICINE: "What medicine(s) are you calling about?"     Pt was seen in the ED and given nausea medicine.   It is making her sleepy and is not helping.   She is still vomiting.  Feels very weak and having some dizziness.  She is vomiting up the pills she was given for a UTI and the nausea medication.   She just started the medication for the UTI today because she has been too sick to go get it.   Doesn't know if she is vomiting from the pregnancy, medication or UTI.   She was seen Thur.   I don't know what else to do.  I was called and told I had the UTI.  I didn't know I had a UTI.   I've not been having symptoms.     I'm vomiting so much.   I've not urinated today.    2. QUESTION: "What is your question?" (e.g., double dose of medicine, side effect)     What should I do?   I'm vomiting.     Pregnant [redacted] weeks.  I went to the ED.   I have a UTI.   I've been so sick.   The nausea and vomiting is bad.   Does not have a PCP.   3. PRESCRIBER: "Who prescribed the medicine?" Reason: if prescribed by specialist, call should be referred to that group.     ED doctor 4. SYMPTOMS: "Do you have any symptoms?" If Yes, ask: "What symptoms are you having?"  "How bad are the symptoms (e.g., mild, moderate, severe)     Vomiting,nausea and weak with dizziness.   5. PREGNANCY:  "Is there any chance that you are pregnant?" "When was your last menstrual period?"     *No Answer*  Answer Assessment - Initial Assessment Questions 1. VOMITING SEVERITY: "How many times have you vomited in the past 24 hours?"     - MILD:  1 - 2 times/day    - MODERATE: 3 - 5 times/day, decreased oral intake without  significant weight loss or symptoms of dehydration    - SEVERE: 6 or more times/day, vomits everything or nearly everything, with significant weight loss, symptoms of dehydration      Pt seen in ED on Thur.   Given medication for nausea.   The medicine is not helping and she is vomiting up the pills.  She has not urinated today.   Feels weak and having dizziness when she stands up. 2. ONSET: "When did the vomiting begin?"      Been vomiting since seen in ED Thur. 3. FLUIDS: "What fluids or food have you vomited up today?" "Have you been able to keep any fluids down?"     I'm vomiting up every thing.   "I'm vomiting the lining of my stomach I've vomited so much".   "I have not eaten for several days". 4. ABDOMEN PAIN: "Are your having any abdomen pain?" If Yes : "How bad is it and what does it feel like?" (e.g., crampy, dull, intermittent, constant)      Not asked but is [redacted] weeks pregnant. 5. DIARRHEA: "Is there any diarrhea?"  If Yes, ask: "How many times today?"      No 6. CONTACTS: "Is there anyone else in the family with the same symptoms?"      Not asked 7. CAUSE: "What do you think is causing your vomiting?"     I'm not sure if it is the UTI, pregnancy or the medication causing me to vomit so much. 8. HYDRATION STATUS: "Any signs of dehydration?" (e.g., dry mouth [not only dry lips], too weak to stand) "When did you last urinate?"     Yes   Have not urinated today.   Dizzy when she stands or gets up.  Feeling very weak. 9. OTHER SYMPTOMS: "Do you have any other symptoms?" (e.g., fever, headache, vertigo, vomiting blood or coffee grounds, recent head injury)     Did not know she had a UTI until they called her from the ED and let her know.   No UTI symptoms.    Medication for the UTI was started today but she is not able to keep any of the medications down. 10. PREGNANCY: "Is there any chance you are pregnant?" "When was your last menstrual period?"       Yes  8 weeks  Protocols used:  Medication Question Call-A-AH, Vomiting-A-AH

## 2022-03-12 NOTE — MAU Provider Note (Cosign Needed)
History     CSN: [id number]  Arrival date and time: 03/12/22 1901   None    Chief Complaint  Patient presents with   Nausea   Emesis   HPI  Lauren Costa is a 26 y.o. [phone] at [redacted]w[redacted]d by LMP who presents to MAU for abdominal pain, nausea, and vomiting. Patient reports symptoms started about 1 week ago, however have worsened. She was given Zofran ODT which has not helped. She reports she has vomited approximately 20 times today and is unable to keep down any fluids or food. She reports some lower abdominal pain and cramping that started today. Pain is constant. She denies vaginal bleeding or discharge. No fever or chills. She recently was treated for gonorrhea and is also currently taking an antibiotic for a UTI which she reports she cannot keep down due to nausea. LMP was 01/16/22. She reports she is scheduled to go to [facility] in September.  OB History     Gravida  5   Para  1   Term  1   Preterm      AB  3   Living  1      SAB  0   IAB  3   Ectopic      Multiple  0   Live Births  1           Past Medical History:  Diagnosis Date   Anxiety    as a child   Bilateral breast lump 03/19/2016   Chronic kidney disease    kidney infection    Past Surgical History:  Procedure Laterality Date   DENTAL SURGERY      Family History  Problem Relation Age of Onset   Cancer Maternal Aunt    Cancer Maternal Uncle    Alcohol abuse Neg Hx    Arthritis Neg Hx    Asthma Neg Hx    Birth defects Neg Hx    COPD Neg Hx    Depression Neg Hx    Diabetes Neg Hx    Drug abuse Neg Hx    Early death Neg Hx    Hearing loss Neg Hx    Heart disease Neg Hx    Hyperlipidemia Neg Hx    Hypertension Neg Hx    Kidney disease Neg Hx    Learning disabilities Neg Hx    Mental illness Neg Hx    Mental retardation Neg Hx    Miscarriages / Stillbirths Neg Hx    Stroke Neg Hx    Vision loss Neg Hx    Varicose Veins Neg Hx     Social History   Tobacco Use   Smoking  status: Some Days    Types: Cigarettes    Last attempt to quit: 04/19/2016    Years since quitting: 5.8   Smokeless tobacco: Former  Building services engineer Use: Never used  Substance Use Topics   Alcohol use: No   Drug use: Not Currently    Types: Marijuana    Allergies:  Allergies  Allergen Reactions   Peanut-Containing Drug Products Hives    Medications Prior to Admission  Medication Sig Dispense Refill Last Dose   cefadroxil (DURICEF) 500 MG capsule Take 1 capsule (500 mg total) by mouth 2 (two) times daily for 7 days. 14 capsule 0    chlorhexidine (HIBICLENS) 4 % external liquid Apply topically daily as needed. 120 mL 1    clindamycin (CLEOCIN-T) 1 % external solution Apply topically 2 (  two) times daily. 30 mL 1    levocetirizine (XYZAL) 5 MG tablet Take 1 tablet (5 mg total) by mouth every evening. 30 tablet 0    metoCLOPramide (REGLAN) 10 MG tablet Take 1 tablet (10 mg total) by mouth every 8 (eight) hours as needed for nausea. (Patient not taking: Reported on 06/25/2021) 30 tablet 0    ondansetron (ZOFRAN-ODT) 4 MG disintegrating tablet Take 1 tablet (4 mg total) by mouth every 6 (six) hours as needed for nausea. 20 tablet 0    terconazole (TERAZOL 7) 0.4 % vaginal cream Place 1 applicator vaginally at bedtime. 45 g 0     Review of Systems  Constitutional: Negative.   Respiratory: Negative.    Cardiovascular: Negative.   Gastrointestinal:  Positive for abdominal pain, nausea and vomiting.  Genitourinary: Negative.   Musculoskeletal: Negative.   Neurological: Negative.    Physical Exam   Blood pressure 127/80, pulse 67, temperature 99.3 F (37.4 C), temperature source Oral, resp. rate 16, height 5\' 5"  (1.651 m), weight 103.6 kg, last menstrual period 01/16/2022, SpO2 100 %, unknown if currently breastfeeding.  Physical Exam Vitals and nursing note reviewed.  Constitutional:      General: She is not in acute distress. Eyes:     Extraocular Movements: Extraocular  movements intact.     Pupils: Pupils are equal, round, and reactive to light.  Cardiovascular:     Rate and Rhythm: Normal rate.  Pulmonary:     Effort: Pulmonary effort is normal.  Abdominal:     Palpations: Abdomen is soft.     Tenderness: There is abdominal tenderness in the suprapubic area. There is no guarding.  Musculoskeletal:        General: Normal range of motion.     Cervical back: Normal range of motion.  Skin:    General: Skin is warm and dry.  Neurological:     General: No focal deficit present.     Mental Status: She is alert and oriented to person, place, and time.  Psychiatric:        Mood and Affect: Mood normal.        Behavior: Behavior normal.        Judgment: Judgment normal.    MAU Course  Procedures  MDM UA >80 ketones. LR bolus with IV Reglan and Pepcid CBC, CMP, HCG Ultrasound as patient has not had a confirmed IUP Care handed over to 03/18/2022, CNM at 2100   2101, CNM 03/12/22 9:01 PM  Results for orders placed or performed during the hospital encounter of 03/12/22 (from the past 24 hour(s))  Urinalysis, Routine w reflex microscopic     Status: Abnormal   Collection Time: 03/12/22  7:35 PM  Result Value Ref Range   Color, Urine AMBER (A) YELLOW   APPearance HAZY (A) CLEAR   Specific Gravity, Urine 1.032 (H) 1.005 - 1.030   pH 5.0 5.0 - 8.0   Glucose, UA NEGATIVE NEGATIVE mg/dL   Hgb urine dipstick NEGATIVE NEGATIVE   Bilirubin Urine NEGATIVE NEGATIVE   Ketones, ur 80 (A) NEGATIVE mg/dL   Protein, ur 30 (A) NEGATIVE mg/dL   Nitrite NEGATIVE NEGATIVE   Leukocytes,Ua NEGATIVE NEGATIVE   RBC / HPF 0-5 0 - 5 RBC/hpf   WBC, UA 0-5 0 - 5 WBC/hpf   Bacteria, UA NONE SEEN NONE SEEN   Squamous Epithelial / LPF 11-20 0 - 5   Mucus PRESENT   CBC     Status: None  Collection Time: 03/12/22  9:44 PM  Result Value Ref Range   WBC 9.6 4.0 - 10.5 K/uL   RBC 4.84 3.87 - 5.11 MIL/uL   Hemoglobin 12.9 12.0 - 15.0 g/dL   HCT 56.2 13.0  - 86.5 %   MCV 80.6 80.0 - 100.0 fL   MCH 26.7 26.0 - 34.0 pg   MCHC 33.1 30.0 - 36.0 g/dL   RDW 78.4 69.6 - 29.5 %   Platelets 245 150 - 400 K/uL   nRBC 0.0 0.0 - 0.2 %  Comprehensive metabolic panel     Status: Abnormal   Collection Time: 03/12/22  9:44 PM  Result Value Ref Range   Sodium 135 135 - 145 mmol/L   Potassium 3.0 (L) 3.5 - 5.1 mmol/L   Chloride 101 98 - 111 mmol/L   CO2 22 22 - 32 mmol/L   Glucose, Bld 81 70 - 99 mg/dL   BUN 7 6 - 20 mg/dL   Creatinine, Ser 2.84 0.44 - 1.00 mg/dL   Calcium 9.3 8.9 - 13.2 mg/dL   Total Protein 7.6 6.5 - 8.1 g/dL   Albumin 3.8 3.5 - 5.0 g/dL   AST 18 15 - 41 U/L   ALT 19 0 - 44 U/L   Alkaline Phosphatase 72 38 - 126 U/L   Total Bilirubin 1.1 0.3 - 1.2 mg/dL   GFR, Estimated >44 >01 mL/min   Anion gap 12 5 - 15  hCG, quantitative, pregnancy     Status: Abnormal   Collection Time: 03/12/22  9:44 PM  Result Value Ref Range   hCG, Beta Chain, Quant, S 120,144 (H) <5 mIU/mL   US OB Comp Less 14 Wks  Result Date: 03/12/2022 CLINICAL DATA:  Pregnant patient with abdominal pain. EXAM: OBSTETRIC <14 WK ULTRASOUND TECHNIQUE: Transabdominal ultrasound was performed for evaluation of the gestation as well as the maternal uterus and adnexal regions. COMPARISON:  None this pregnancy. Obstetric ultrasound 03/24/2021 from a prior pregnancy reviewed FINDINGS: Intrauterine gestational sac: Single Yolk sac:  Visualized. Embryo:  Visualized. Cardiac Activity: Visualized. Heart Rate: 137 bpm CRL:   10.8 mm   7 w 1 d                  [place] EDC: 10/28/2022 Subchorionic hemorrhage:  None visualized. Maternal uterus/adnexae: Anteverted uterus with single live intrauterine pregnancy. The left ovary is visualized and normal. There is a corpus luteal cyst in the right ovary. Probable para ovarian cyst measuring 2.8 x 2.5 x 2.9 cm, appearing simple. This was seen on prior exam when it measured 3.2 cm. IMPRESSION: 1. Single live intrauterine pregnancy estimated  gestational age [redacted] weeks 1 day based on crown-rump length for ultrasound [facility] 10/28/2022. 2. 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 This is of doubtful clinical significance. Electronically Signed   By: Narda Rutherford M.D.   On: 03/12/2022 22:12    - Nausea and vomiting improved with IV hydration, Reglan and Pepcid.  - Patient desires to go home.   Assessment and Plan   1. Nausea and vomiting during pregnancy   2. [redacted] weeks gestation of pregnancy    - Discussed that nausea and vomiting in early pregnancy is normal. Patient has nausea medication at home, but has been unable to keep down.  - Discussed Sea bands and other methods for nausea and vomiting.  - Discussed the optional use of scop patch to keep medication and antibiotics down.  -  Rx for scopolamine patch sent to outpatient pharmacy. Good Rx coupon printed for use.  - Early pregnancy precautions reviewed.  - Encouraged to keep attempting to PO hydrate.  - Patient discharged home in stable condition and may return to MAU as needed.    Shantonette Danella Deis) Suzie Portela, MSN, CNM  [facility]  03/13/22 2:58 AM

## 2022-05-16 ENCOUNTER — Other Ambulatory Visit: Payer: Self-pay

## 2022-05-16 ENCOUNTER — Emergency Department (HOSPITAL_BASED_OUTPATIENT_CLINIC_OR_DEPARTMENT_OTHER): Payer: Medicaid Other | Admitting: Radiology

## 2022-05-16 ENCOUNTER — Encounter (HOSPITAL_BASED_OUTPATIENT_CLINIC_OR_DEPARTMENT_OTHER): Payer: Self-pay

## 2022-05-16 ENCOUNTER — Emergency Department (HOSPITAL_BASED_OUTPATIENT_CLINIC_OR_DEPARTMENT_OTHER)
Admission: EM | Admit: 2022-05-16 | Discharge: 2022-05-16 | Disposition: A | Discharge status: Home / Self Care | Payer: Medicaid Other | Attending: Emergency Medicine | Admitting: Emergency Medicine

## 2022-05-16 DIAGNOSIS — Z20822 Contact with and (suspected) exposure to covid-19: Secondary | ICD-10-CM | POA: Insufficient documentation

## 2022-05-16 DIAGNOSIS — J45909 Unspecified asthma, uncomplicated: Secondary | ICD-10-CM | POA: Diagnosis not present

## 2022-05-16 DIAGNOSIS — R0600 Dyspnea, unspecified: Secondary | ICD-10-CM | POA: Diagnosis not present

## 2022-05-16 DIAGNOSIS — N189 Chronic kidney disease, unspecified: Secondary | ICD-10-CM | POA: Insufficient documentation

## 2022-05-16 DIAGNOSIS — J069 Acute upper respiratory infection, unspecified: Secondary | ICD-10-CM | POA: Insufficient documentation

## 2022-05-16 DIAGNOSIS — R0602 Shortness of breath: Secondary | ICD-10-CM | POA: Diagnosis not present

## 2022-05-16 DIAGNOSIS — Z91010 Allergy to peanuts: Secondary | ICD-10-CM | POA: Diagnosis not present

## 2022-05-16 DIAGNOSIS — R059 Cough, unspecified: Secondary | ICD-10-CM | POA: Diagnosis present

## 2022-05-16 LAB — RESP PANEL BY RT-PCR (FLU A&B, COVID) ARPGX2
Influenza A by PCR: NEGATIVE
Influenza B by PCR: NEGATIVE
SARS Coronavirus 2 by RT PCR: NEGATIVE

## 2022-05-16 MED ORDER — DEXTROMETHORPHAN POLISTIREX ER 30 MG/5ML PO SUER: 30.0000 mg | Freq: Two times a day (BID) | ORAL | 0 refills | Status: DC | PRN

## 2022-05-16 MED ORDER — ACETAMINOPHEN 325 MG PO TABS: 650.0000 mg | ORAL_TABLET | Freq: Four times a day (QID) | ORAL | 0 refills | Status: DC | PRN

## 2022-05-16 MED ORDER — AEROCHAMBER PLUS FLO-VU MEDIUM MISC
1.0000 | Freq: Once | Status: DC
  Filled 2022-05-16: qty 1

## 2022-05-16 MED ORDER — ALBUTEROL SULFATE HFA 108 (90 BASE) MCG/ACT IN AERS: 1.0000 | INHALATION_SPRAY | Freq: Four times a day (QID) | RESPIRATORY_TRACT | 0 refills | Status: DC | PRN

## 2022-05-16 MED ORDER — FLUTICASONE PROPIONATE 50 MCG/ACT NA SUSP: 1.0000 | Freq: Every day | NASAL | 0 refills | Status: DC

## 2022-05-16 MED ORDER — IBUPROFEN 600 MG PO TABS: 600.0000 mg | ORAL_TABLET | Freq: Four times a day (QID) | ORAL | 0 refills | Status: DC | PRN

## 2022-05-16 MED ORDER — GUAIFENESIN-CODEINE 100-10 MG/5ML PO SOLN: 10.0000 mL | Freq: Every evening | ORAL | 0 refills | Status: DC | PRN

## 2022-05-16 NOTE — Discharge Instructions (Signed)
It was a pleasure caring for you today in the emergency department. ° °Please return to the emergency department for any worsening or worrisome symptoms. ° ° °

## 2022-05-16 NOTE — ED Triage Notes (Signed)
Patient here POV from Home.  Endorses Mild URI Symptoms that began Monday and recently over the Past few Days she has had increasing SOB.   No Known Fevers. Mostly Dry Cough.   NAD Noted during Triage. A&Ox4. GCS 15. Ambulatory.

## 2022-05-16 NOTE — ED Provider Notes (Signed)
[facility] EMERGENCY DEPT Provider Note   CSN: [id number] Arrival date & time: 05/16/22  1515     History  Chief Complaint  Patient presents with   Shortness of Breath    Lauren Costa is a 26 y.o. female.  Patient as above with significant medical history as below, including prior kidney infection, anxiety, childhood asthma who presents to the ED with complaint of cough, congestion, malaise, sneezing, postnasal drip, dyspnea.  Symptom onset 4 days ago, daughter with similar symptoms at home.  Has been taking [employer] children's cough medicine with mild improvement to her symptoms.  Still having postnasal drip, coughing, congestion, mild dyspnea with exertion.  She borrowed a friend's albuterol inhaler last night which did show some improvement to her symptoms.  Denies fevers, chills, nausea or vomiting.  Tolerant p.o. intake with difficulty.  No chest pain.  No headaches.  No falls, no numbness or tingling that seems new.  No pain with neck movement     Past Medical History:  Diagnosis Date   Anxiety    as a child   Bilateral breast lump 03/19/2016   Chronic kidney disease    kidney infection    Past Surgical History:  Procedure Laterality Date   DENTAL SURGERY       The history is provided by the patient. No language interpreter was used.  Shortness of Breath Associated symptoms: cough   Associated symptoms: no abdominal pain, no chest pain, no fever, no headaches and no rash        Home Medications Prior to Admission medications   Medication Sig Start Date End Date Taking? Authorizing Provider  acetaminophen (TYLENOL) 325 MG tablet Take 2 tablets (650 mg total) by mouth every 6 (six) hours as needed. 05/16/22  Yes Tanda Rockers A, DO  albuterol (VENTOLIN HFA) 108 (90 Base) MCG/ACT inhaler Inhale 1-2 puffs into the lungs every 6 (six) hours as needed for wheezing or shortness of breath. 05/16/22  Yes Sloan Leiter, DO  dextromethorphan (DELSYM) 30 MG/5ML  liquid Take 5 mLs (30 mg total) by mouth 2 (two) times daily as needed for cough. 05/16/22  Yes Tanda Rockers A, DO  fluticasone (FLONASE) 50 MCG/ACT nasal spray Place 1 spray into both nostrils daily for 7 days. 05/16/22 05/23/22 Yes Tanda Rockers A, DO  guaiFENesin-codeine 100-10 MG/5ML syrup Take 10 mLs by mouth at bedtime as needed for cough. 05/16/22  Yes Tanda Rockers A, DO  ibuprofen (ADVIL) 600 MG tablet Take 1 tablet (600 mg total) by mouth every 6 (six) hours as needed. 05/16/22  Yes Tanda Rockers A, DO  chlorhexidine (HIBICLENS) 4 % external liquid Apply topically daily as needed. 06/25/21   Hoy Register, MD  clindamycin (CLEOCIN-T) 1 % external solution Apply topically 2 (two) times daily. 06/25/21   Hoy Register, MD  levocetirizine (XYZAL) 5 MG tablet Take 1 tablet (5 mg total) by mouth every evening. 08/09/21   Crain, Whitney L, PA  metoCLOPramide (REGLAN) 10 MG tablet Take 1 tablet (10 mg total) by mouth every 8 (eight) hours as needed for nausea. Patient not taking: Reported on 06/25/2021 03/24/21   Judeth Horn, NP  ondansetron (ZOFRAN-ODT) 4 MG disintegrating tablet Take 1 tablet (4 mg total) by mouth every 6 (six) hours as needed for nausea. 03/05/22   Aviva Signs, CNM  scopolamine (TRANSDERM-SCOP) 1 MG/3DAYS Place 1 patch (1.5 mg total) onto the skin every 3 (three) days. 03/12/22   Carlynn Herald, CNM  terconazole (TERAZOL 7)  0.4 % vaginal cream Place 1 applicator vaginally at bedtime. 03/05/22   Seabron Spates, CNM  promethazine (PHENERGAN) 12.5 MG tablet Take 1 tablet (12.5 mg total) by mouth every 6 (six) hours as needed for up to 30 days for nausea or vomiting. Can use vaginally if unable to keep anything down 01/11/19 10/05/19  Laury Deep, CNM      Allergies    Peanut-containing drug products    Review of Systems   Review of Systems  Constitutional:  Negative for activity change and fever.  HENT:  Positive for congestion, postnasal drip, rhinorrhea and  sneezing. Negative for facial swelling and trouble swallowing.   Eyes:  Negative for discharge and redness.  Respiratory:  Positive for cough and shortness of breath.   Cardiovascular:  Negative for chest pain and palpitations.  Gastrointestinal:  Negative for abdominal pain and nausea.  Genitourinary:  Negative for dysuria and flank pain.  Musculoskeletal:  Negative for back pain and gait problem.  Skin:  Negative for pallor and rash.  Neurological:  Negative for syncope and headaches.    Physical Exam Updated Vital Signs BP (!) 140/73 (BP Location: Right Arm)   Pulse 92   Temp 98.8 F (37.1 C)   Resp 16   Ht 5\' 5"  (1.651 m)   Wt 103.6 kg   LMP 01/16/2022   SpO2 97%   Breastfeeding Unknown   BMI 38.01 kg/m  Physical Exam Vitals and nursing note reviewed.  Constitutional:      General: She is not in acute distress.    Appearance: Normal appearance. She is well-developed. She is obese. She is not ill-appearing or diaphoretic.  HENT:     Head: Normocephalic and atraumatic.     Jaw: There is normal jaw occlusion.     Right Ear: External ear normal.     Left Ear: External ear normal.     Nose: Nose normal.     Mouth/Throat:     Mouth: Mucous membranes are moist.     Pharynx: Uvula midline. No uvula swelling.     Comments: Mild cobblestoning noted to posterior oropharynx   No drooling stridor or trismus Eyes:     General: No scleral icterus.       Right eye: No discharge.        Left eye: No discharge.  Cardiovascular:     Rate and Rhythm: Normal rate and regular rhythm.     Pulses: Normal pulses.     Heart sounds: Normal heart sounds.  Pulmonary:     Effort: Pulmonary effort is normal. No respiratory distress.     Breath sounds: Wheezing present.     Comments: Trace wheezing bilateral Abdominal:     General: Abdomen is flat.     Tenderness: There is no abdominal tenderness.  Musculoskeletal:        General: Normal range of motion.     Cervical back: Full  passive range of motion without pain and normal range of motion.     Right lower leg: No edema.     Left lower leg: No edema.  Skin:    General: Skin is warm and dry.     Capillary Refill: Capillary refill takes less than 2 seconds.  Neurological:     Mental Status: She is alert and oriented to person, place, and time.     GCS: GCS eye subscore is 4. GCS verbal subscore is 5. GCS motor subscore is 6.  Psychiatric:  Mood and Affect: Mood normal.        Behavior: Behavior normal.     ED Results / Procedures / Treatments   Labs (all labs ordered are listed, but only abnormal results are displayed) Labs Reviewed  RESP PANEL BY RT-PCR (FLU A&B, COVID) [id number]    EKG EKG Interpretation  Date/Time:  Friday May 16 2022 15:26:07 EDT Ventricular Rate:  98 PR Interval:  142 QRS Duration: 84 QT Interval:  314 QTC Calculation: 400 R Axis:   84 Text Interpretation: Normal sinus rhythm Normal ECG No previous ECGs available no stemi Confirmed by Tanda Rockers ([id number]) on 05/16/2022 5:07:41 PM  Radiology DG Chest 2 View  Result Date: 05/16/2022 CLINICAL DATA:  Shortness of breath EXAM: CHEST - 2 VIEW COMPARISON:  None Available. FINDINGS: The heart size and mediastinal contours are within normal limits. Both lungs are clear. The visualized skeletal structures are unremarkable. IMPRESSION: No acute cardiopulmonary abnormality. Electronically Signed   By: Jacob Moores M.D.   On: 05/16/2022 16:02    Procedures Procedures    Medications Ordered in ED Medications  AeroChamber Plus Flo-Vu Medium MISC 1 each (has no administration in time range)    ED Course/ Medical Decision Making/ A&P                           Medical Decision Making Risk OTC drugs. Prescription drug management.   This patient presents to the ED with chief complaint(s) of URI symptoms, as above with pertinent past medical history of above which further complicates the presenting complaint. The complaint  involves an extensive differential diagnosis and also carries with it a high risk of complications and morbidity.    The differential diagnosis includes but not limited to viral URI, bacterial infection, pharyngitis, COVID-19, influenza, asthma exacerbation, sinusitis, etc. Serious etiologies were considered.   The initial plan is to chest x-ray, RVP, EKG was also obtained   Additional history obtained: Additional history obtained from  na Records reviewed  prior urgent care notes, prior ED notes, prior labs and imaging  Independent labs interpretation:  The following labs were independently interpreted:  rvP was negative  Independent visualization of imaging: - I independently visualized the following imaging with scope of interpretation limited to determining acute life threatening conditions related to emergency care: Chest x-ray, which revealed no acute process  Cardiac monitoring was reviewed and interpreted by myself which shows na  Treatment and Reassessment: Given spacer for use with albuterol inhaler  Consultation: - Consulted or discussed management/test interpretation w/ external professional: na  Consideration for admission or further workup: Admission was considered   Well-appearing 26 year old female to the ED with URI symptoms.  Dyspnea.  Cough.  Physical exam is reassuring.  She is ambulatory w/o sig dib or difficulty. She is breathing comfortably on ambient air.  Trace wheezing on exam.  We will give albuterol inhaler for home, give spacer.  Have RT instruct patient on use of inhaler/spacer.  Discussed supportive care.  The patient improved significantly and was discharged in stable condition. Detailed discussions were had with the patient regarding current findings, and need for close f/u with PCP or on call doctor. The patient has been instructed to return immediately if the symptoms worsen in any way for re-evaluation. Patient verbalized understanding and is in  agreement with current care plan. All questions answered prior to discharge.    Social Determinants of health: Counseled patient for approximately 3 minutes regarding smoking  cessation. Discussed risks of smoking and how they applied and affected their visit here today. Patient not ready to quit at this time, however will follow up with their primary doctor when they are.   CPT code: 66599: intermediate counseling for smoking cessation   Social History   Tobacco Use   Smoking status: Former    Types: Cigarettes    Quit date: 04/19/2016    Years since quitting: 6.0   Smokeless tobacco: Former  Building services engineer Use: Never used  Substance Use Topics   Alcohol use: No   Drug use: Not Currently    Types: Marijuana            Final Clinical Impression(s) / ED Diagnoses Final diagnoses:  URI with cough and congestion    Rx / DC Orders ED Discharge Orders          Ordered    fluticasone (FLONASE) 50 MCG/ACT nasal spray  Daily        05/16/22 1704    guaiFENesin-codeine 100-10 MG/5ML syrup  At bedtime PRN        05/16/22 1704    dextromethorphan (DELSYM) 30 MG/5ML liquid  2 times daily PRN        05/16/22 1704    acetaminophen (TYLENOL) 325 MG tablet  Every 6 hours PRN        05/16/22 1704    ibuprofen (ADVIL) 600 MG tablet  Every 6 hours PRN        05/16/22 1704    albuterol (VENTOLIN HFA) 108 (90 Base) MCG/ACT inhaler  Every 6 hours PRN        05/16/22 1708              Tanda Rockers A, DO 05/16/22 1710

## 2022-05-16 NOTE — ED Notes (Signed)
Discharge paperwork given and verbally understood. 

## 2022-09-09 ENCOUNTER — Emergency Department (HOSPITAL_BASED_OUTPATIENT_CLINIC_OR_DEPARTMENT_OTHER)
Admission: EM | Admit: 2022-09-09 | Discharge: 2022-09-09 | Disposition: A | Discharge status: Home / Self Care | Payer: Medicaid Other | Attending: Emergency Medicine | Admitting: Emergency Medicine

## 2022-09-09 ENCOUNTER — Other Ambulatory Visit: Payer: Self-pay

## 2022-09-09 ENCOUNTER — Encounter (HOSPITAL_BASED_OUTPATIENT_CLINIC_OR_DEPARTMENT_OTHER): Payer: Self-pay

## 2022-09-09 ENCOUNTER — Emergency Department (HOSPITAL_BASED_OUTPATIENT_CLINIC_OR_DEPARTMENT_OTHER): Payer: Medicaid Other

## 2022-09-09 DIAGNOSIS — Z91010 Allergy to peanuts: Secondary | ICD-10-CM | POA: Diagnosis not present

## 2022-09-09 DIAGNOSIS — R0789 Other chest pain: Secondary | ICD-10-CM | POA: Insufficient documentation

## 2022-09-09 DIAGNOSIS — R079 Chest pain, unspecified: Secondary | ICD-10-CM | POA: Diagnosis present

## 2022-09-09 DIAGNOSIS — M791 Myalgia, unspecified site: Secondary | ICD-10-CM | POA: Diagnosis not present

## 2022-09-09 LAB — CBC
HCT: 38.6 % (ref 36.0–46.0)
Hemoglobin: 12.3 g/dL (ref 12.0–15.0)
MCH: 26.1 pg (ref 26.0–34.0)
MCHC: 31.9 g/dL (ref 30.0–36.0)
MCV: 82 fL (ref 80.0–100.0)
Platelets: 279 10*3/uL (ref 150–400)
RBC: 4.71 MIL/uL (ref 3.87–5.11)
RDW: 14.6 % (ref 11.5–15.5)
WBC: 8.3 10*3/uL (ref 4.0–10.5)
nRBC: 0 % (ref 0.0–0.2)

## 2022-09-09 LAB — BASIC METABOLIC PANEL
Anion gap: 8 (ref 5–15)
BUN: 10 mg/dL (ref 6–20)
CO2: 25 mmol/L (ref 22–32)
Calcium: 9.7 mg/dL (ref 8.9–10.3)
Chloride: 105 mmol/L (ref 98–111)
Creatinine, Ser: 0.71 mg/dL (ref 0.44–1.00)
GFR, Estimated: >60 mL/min (ref >60)
Glucose, Bld: 89 mg/dL (ref 70–99)
Potassium: 3.5 mmol/L (ref 3.5–5.1)
Sodium: 138 mmol/L (ref 135–145)

## 2022-09-09 LAB — HCG, SERUM, QUALITATIVE: Preg, Serum: NEGATIVE

## 2022-09-09 LAB — TROPONIN I (HIGH SENSITIVITY): Troponin I (High Sensitivity): <2 ng/L (ref <18)

## 2022-09-09 NOTE — Discharge Instructions (Signed)
You were seen in the emergency department today for chest pain.  As we discussed your workup including blood work, EKG, and chest x-ray was reassuring today.  With the lump in your left arm, I think this is likely a lymph node and the swelling should resolve on its own.  I recommend following up with your OB/GYN about it as you can discuss a breast exam and maybe an ultrasound.  Continue to monitor how you're doing and return to the ER for new or worsening symptoms.

## 2022-09-09 NOTE — ED Provider Notes (Signed)
Lauren Costa   CSN: [id number] Arrival date & time: 09/09/22  1457     History  Chief Complaint  Patient presents with   Chest Pain    Lauren Costa is a 27 y.o. female with no significant PMH presents to the emergency department for intermittent chest pain x 1 week. States that it starts in the middle of her chest and radiates to her left arm. Some associated shortness of breath.  States that the shortness of breath gets worse if she is exerting herself, but does not necessarily have more chest discomfort.  States that the power steering in her car is been out for the past week, and she has been having to use more force with driving than normal.  She thinks that this may be contributing to some of her symptoms.  Symptoms not necessarily in correlation with exertion. No fevers, chills, cough. Some increased headaches as of recently. 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 No recent long travel.  No personal history of cardiac or lung disease, no blood clots.  No family history of these either.  Also complaining of a "bump" behind her right ear and in her left axilla.  History of hidradenitis suppurativa, but states that this lump under feels different than a boil.    Chest Pain Associated symptoms: headache and shortness of breath   Associated symptoms: no cough, no fever, no nausea, no palpitations and no vomiting        Home Medications Prior to Admission medications   Medication Sig Start Date End Date Taking? Authorizing Provider  acetaminophen (TYLENOL) 325 MG tablet Take 2 tablets (650 mg total) by mouth every 6 (six) hours as needed. 05/16/22   Jeanell Sparrow, DO  albuterol (VENTOLIN HFA) 108 (90 Base) MCG/ACT inhaler Inhale 1-2 puffs into the lungs every 6 (six) hours as needed for wheezing or shortness of breath. 05/16/22   Jeanell Sparrow, DO  chlorhexidine (HIBICLENS)  4 % external liquid Apply topically daily as needed. 06/25/21   Charlott Rakes, MD  clindamycin (CLEOCIN-T) 1 % external solution Apply topically 2 (two) times daily. 06/25/21   Charlott Rakes, MD  dextromethorphan (DELSYM) 30 MG/5ML liquid Take 5 mLs (30 mg total) by mouth 2 (two) times daily as needed for cough. 05/16/22   Jeanell Sparrow, DO  fluticasone (FLONASE) 50 MCG/ACT nasal spray Place 1 spray into both nostrils daily for 7 days. 05/16/22 05/23/22  Wynona Dove A, DO  guaiFENesin-codeine 100-10 MG/5ML syrup Take 10 mLs by mouth at bedtime as needed for cough. 05/16/22   Jeanell Sparrow, DO  ibuprofen (ADVIL) 600 MG tablet Take 1 tablet (600 mg total) by mouth every 6 (six) hours as needed. 05/16/22   Jeanell Sparrow, DO  levocetirizine (XYZAL) 5 MG tablet Take 1 tablet (5 mg total) by mouth every evening. 08/09/21   Crain, Whitney L, PA  metoCLOPramide (REGLAN) 10 MG tablet Take 1 tablet (10 mg total) by mouth every 8 (eight) hours as needed for nausea. Patient not taking: Reported on 06/25/2021 03/24/21   Jorje Guild, NP  ondansetron (ZOFRAN-ODT) 4 MG disintegrating tablet Take 1 tablet (4 mg total) by mouth every 6 (six) hours as needed for nausea. 03/05/22   Seabron Spates, CNM  scopolamine (TRANSDERM-SCOP) 1 MG/3DAYS Place 1 patch (1.5 mg total) onto the skin every 3 (three) days. 03/12/22   Rollene Rotunda,  Shantonette M, CNM  terconazole (TERAZOL 7) 0.4 % vaginal cream Place 1 applicator vaginally at bedtime. 03/05/22   Seabron Spates, CNM  promethazine (PHENERGAN) 12.5 MG tablet Take 1 tablet (12.5 mg total) by mouth every 6 (six) hours as needed for up to 30 days for nausea or vomiting. Can use vaginally if unable to keep anything down 01/11/19 10/05/19  Laury Deep, CNM      Allergies    Peanut-containing drug products    Review of Systems   Review of Systems  Constitutional:  Negative for chills and fever.  Respiratory:  Positive for shortness of breath. Negative for cough.    Cardiovascular:  Positive for chest pain. Negative for palpitations and leg swelling.  Gastrointestinal:  Negative for nausea and vomiting.  Neurological:  Positive for headaches.  All other systems reviewed and are negative.   Physical Exam Updated Vital Signs BP 137/70 (BP Location: Right Arm)   Pulse 70   Temp 98.2 F (36.8 C)   Resp 18   Ht 5\' 5"  (1.651 m)   Wt 103.6 kg   LMP 01/16/2022   SpO2 100%   BMI 38.01 kg/m  Physical Exam Vitals and nursing Costa reviewed.  Constitutional:      Appearance: Normal appearance.  HENT:     Head: Normocephalic and atraumatic.  Eyes:     Conjunctiva/sclera: Conjunctivae normal.  Cardiovascular:     Rate and Rhythm: Normal rate and regular rhythm.  Pulmonary:     Effort: Pulmonary effort is normal. No respiratory distress.     Breath sounds: Normal breath sounds.  Abdominal:     General: There is no distension.     Palpations: Abdomen is soft.     Tenderness: There is no abdominal tenderness.  Musculoskeletal:     Right lower leg: No edema.     Left lower leg: No edema.  Skin:    General: Skin is warm and dry.     Comments: Small mobile lump behind the right ear, no overlying skin changes.   Left axillary adenopathy, several small well healed scars from past I&D's  Neurological:     General: No focal deficit present.     Mental Status: She is alert.     ED Results / Procedures / Treatments   Labs (all labs ordered are listed, but only abnormal results are displayed) Labs Reviewed  BASIC METABOLIC PANEL  CBC  HCG, SERUM, QUALITATIVE  TROPONIN I (HIGH SENSITIVITY)    EKG None  Radiology DG Chest Portable 1 View  Result Date: 09/09/2022 CLINICAL DATA:  Shortness of breath and chest pain EXAM: PORTABLE CHEST 1 VIEW COMPARISON:  05/16/2022 FINDINGS: Normal cardiomediastinal contours. No pleural effusion or edema. No airspace opacities identified. Visualized osseous structures are unremarkable. IMPRESSION: No active  disease. Electronically Signed   By: Kerby Moors M.D.   On: 09/09/2022 15:32    Procedures Procedures    Medications Ordered in ED Medications - No data to display  ED Course/ Medical Decision Making/ A&P                             Medical Decision Making Amount and/or Complexity of Data Reviewed Labs: ordered. Radiology: ordered.  This patient is a 27 y.o. female  who presents to the ED for concern of intermittent chest pain x 1 week.   Differential diagnoses prior to evaluation: The emergent differential diagnosis includes, but is not limited to,  ACS, pericarditis, myocarditis, aortic dissection, PE, pneumothorax, esophageal spasm or rupture, chronic angina, pneumonia, bronchitis, GERD, reflux/PUD, biliary disease, pancreatitis, costochondritis, anxiety. This is not an exhaustive differential.   Past Medical History / Co-morbidities: Hidradenitis suppurativa  Additional history: Chart reviewed. Pertinent results include: hx of bilateral breast lumps while pregnant in 2017  Physical Exam: Physical exam performed. The pertinent findings include: Normal vital signs. Heart regular rate and rhythm, clear lung sounds. No leg swelling. Left axillary lymph node with no evidence of cellulitis.   Lab Tests/Imaging studies: I personally interpreted labs/imaging and the pertinent results include: CBC and BMP unremarkable.  Negative pregnancy.  Negative troponin.  Chest x-ray without acute abnormalities.  I agree with the radiologist interpretation.  Cardiac monitoring: EKG obtained and interpreted by my attending physician which shows: Normal sinus rhythm   Disposition: After consideration of the diagnostic results and the patients response to treatment, I feel that emergency department workup does not suggest an emergent condition requiring admission or immediate intervention beyond what has been performed at this time. The plan is: discharge to home with reassurance that symptoms  unlikely to be of cardiac or pulmonary origin. PERC negative. Negative troponin and no ischemic changes on EKG. With history of breast lumps and a new left axillary enlarged lymph node, recommended follow up with GYN for breast exam and potential ultrasound. Patient has a well woman visit scheduled for next month. The patient is safe for discharge and has been instructed to return immediately for worsening symptoms, change in symptoms or any other concerns.  Final Clinical Impression(s) / ED Diagnoses Final diagnoses:  Intermittent chest pain  Muscle soreness    Rx / DC Orders ED Discharge Orders     None      Portions of this report may have been transcribed using voice recognition software. Every effort was made to ensure accuracy; however, inadvertent computerized transcription errors may be present.    Estill Cotta 09/09/22 1604    Tretha Sciara, MD 09/12/22 1457

## 2022-09-09 NOTE — ED Notes (Signed)
Patient verbalizes understanding of discharge instructions. Opportunity for questioning and answers were provided. Patient discharged from ED.  °

## 2022-09-09 NOTE — ED Triage Notes (Signed)
Patient here POV from Home.   Endorses CP that began approximately 1 Week ago. Intermittent in nature. Radiates to left Arm.   Some SOB. No N/V.   NAD Noted during Triage. A&Ox4. GCS 15. Ambulatory.

## 2022-12-17 ENCOUNTER — Ambulatory Visit: Payer: Self-pay | Admitting: *Deleted

## 2022-12-17 ENCOUNTER — Other Ambulatory Visit (HOSPITAL_COMMUNITY)
Admission: RE | Admit: 2022-12-17 | Discharge: 2022-12-17 | Disposition: A | Discharge status: Home / Self Care | Payer: Medicaid Other | Source: Ambulatory Visit | Attending: Nurse Practitioner | Admitting: Nurse Practitioner

## 2022-12-17 ENCOUNTER — Ambulatory Visit: Payer: Medicaid Other | Attending: Nurse Practitioner | Admitting: Nurse Practitioner

## 2022-12-17 ENCOUNTER — Encounter: Payer: Self-pay | Admitting: Nurse Practitioner

## 2022-12-17 VITALS — BP 121/70 | HR 83 | Ht 65.0 in | Wt 233.6 lb

## 2022-12-17 DIAGNOSIS — N760 Acute vaginitis: Secondary | ICD-10-CM | POA: Insufficient documentation

## 2022-12-17 DIAGNOSIS — G44209 Tension-type headache, unspecified, not intractable: Secondary | ICD-10-CM | POA: Diagnosis not present

## 2022-12-17 DIAGNOSIS — R590 Localized enlarged lymph nodes: Secondary | ICD-10-CM

## 2022-12-17 DIAGNOSIS — R221 Localized swelling, mass and lump, neck: Secondary | ICD-10-CM

## 2022-12-17 MED ORDER — TOPIRAMATE 50 MG PO TABS: 25.0000 mg | ORAL_TABLET | Freq: Two times a day (BID) | ORAL | 1 refills | Status: DC

## 2022-12-17 NOTE — Progress Notes (Signed)
Patient states that she has been experiencing  headaches, which started two weeks ago. Patient also states that she has had elevated b/p readings.

## 2022-12-17 NOTE — Telephone Encounter (Signed)
Reason for Disposition  [1] Systolic BP  >= 130 OR Diastolic >= 80 AND [2] not taking BP medications  Answer Assessment - Initial Assessment Questions 1. BLOOD PRESSURE: "What is the blood pressure?" "Did you take at least two measurements 5 minutes apart?"     166/95 last night.   I feel awful all week.   I'm getting headaches.   I'm using ibuprofen.    I work at home.   I am so tired.   I fell asleep while I was working.   I was feeling jittery.   I had some dizziness with nausea once. My sister is diabetic so I checked my BP with her.   2. ONSET: "When did you take your blood pressure?"     Last night with my sister's cuff. 3. HOW: "How did you take your blood pressure?" (e.g., automatic home BP monitor, visiting nurse)     My sister's cuff 4. HISTORY: "Do you have a history of high blood pressure?"     No I had high BP with my last pregnancy.     5. MEDICINES: "Are you taking any medicines for blood pressure?" "Have you missed any doses recently?"     Never been on medication 6. OTHER SYMPTOMS: "Do you have any symptoms?" (e.g., blurred vision, chest pain, difficulty breathing, headache, weakness)     Dizziness and not feeling well and tired. 7. PREGNANCY: "Is there any chance you are pregnant?" "When was your last menstrual period?"     Possible  Protocols used: Blood Pressure - High-A-AH

## 2022-12-17 NOTE — Telephone Encounter (Signed)
  Chief Complaint: elevated BP 166/95 last night Symptoms: Having headaches and not feeling well.  Very tired.  Mild dizziness with nausea once. Frequency: BP taken last night at sister's house because she hasn't been feeling well.    Pertinent Negatives: Patient denies history of hypertension or being on medications.   She did have elevated BP with her last pregnancy but not since. Disposition: [] ED /[] Urgent Care (no appt availability in office) / [x] Appointment(In office/virtual)/ []  [facility]/ [] Home Care/ [] Refused Recommended Disposition /[] [facility]/ []  Follow-up with PCP Additional Notes: Appt. Made for today with Bertram Denver, NP for 3:50.

## 2022-12-17 NOTE — Progress Notes (Signed)
Assessment & Plan:  Lauren Costa was seen today for headache.  Diagnoses and all orders for this visit:  Acute non intractable tension-type headache -     topiramate (TOPAMAX) 50 MG tablet; Take 0.5 tablets (25 mg total) by mouth 2 (two) times daily. May increase to a whole tablet twice per day if needed  Neck fullness -     US THYROID; Future  Occipital lymphadenopathy -     US Soft Tissue Head/Neck (NON-THYROID); Future  Acute vaginitis -     Cervicovaginal ancillary only    Patient has been counseled on age-appropriate routine health concerns for screening and prevention. These are reviewed and up-to-date. Referrals have been placed accordingly. Immunizations are up-to-date or declined.    Subjective:   Chief Complaint  Patient presents with   Headache   HPI Lauren Costa 27 y.o. female presents to office today for acute left sided headaches  [name] is a patient of Dr. Baxter Flattery and presents today with 2 week onset of left sided occipital headaches unresponsive to OTC ibuprofen. She also has an right sided occipital/post auricular lymphadenopathy that seems to be increasing in size over the past few years.   In regard to her headaches she does endorse skipping meals and drinking caffeine products although she is trying to cut back on this.     She is requesting a pregnancy test today despite her menstrual cycle not being expected until around May 13 as her last menstrual cycle was April 13.  I Would not recommend pregnancy testing unless her menstrual cycle is at least 30 days late.   On physical exam today of her occipital lymph node I noticed that she has moderate neck fullness and what feels like an enlarged thyroid.  She denies any family history or personal history of any thyroid disorder  GU Endorses malodorous vaginal discharge and irritation. States she has been using boric acid suppositories with last use several days ago.    Review of Systems  Constitutional:   Negative for fever, malaise/fatigue and weight loss.  HENT: Negative.  Negative for nosebleeds.   Eyes: Negative.  Negative for blurred vision, double vision and photophobia.  Respiratory: Negative.  Negative for cough and shortness of breath.   Cardiovascular: Negative.  Negative for chest pain, palpitations and leg swelling.  Gastrointestinal: Negative.  Negative for heartburn, nausea and vomiting.  Genitourinary:        See HPI  Musculoskeletal: Negative.  Negative for myalgias.  Neurological:  Positive for headaches. Negative for dizziness, focal weakness and seizures.  Psychiatric/Behavioral: Negative.  Negative for suicidal ideas.     Past Medical History:  Diagnosis Date   Anxiety    as a child   Bilateral breast lump 03/19/2016   Chronic kidney disease    kidney infection    Past Surgical History:  Procedure Laterality Date   DENTAL SURGERY      Family History  Problem Relation Age of Onset   Cancer Maternal Aunt    Cancer Maternal Uncle    Alcohol abuse Neg Hx    Arthritis Neg Hx    Asthma Neg Hx    Birth defects Neg Hx    COPD Neg Hx    Depression Neg Hx    Diabetes Neg Hx    Drug abuse Neg Hx    Early death Neg Hx    Hearing loss Neg Hx    Heart disease Neg Hx    Hyperlipidemia Neg Hx  Hypertension Neg Hx    Kidney disease Neg Hx    Learning disabilities Neg Hx    Mental illness Neg Hx    Mental retardation Neg Hx    Miscarriages / Stillbirths Neg Hx    Stroke Neg Hx    Vision loss Neg Hx    Varicose Veins Neg Hx     Social History Reviewed with no changes to be made today.   Outpatient Medications Prior to Visit  Medication Sig Dispense Refill   ibuprofen (ADVIL) 600 MG tablet Take 1 tablet (600 mg total) by mouth every 6 (six) hours as needed. 30 tablet 0   fluticasone (FLONASE) 50 MCG/ACT nasal spray Place 1 spray into both nostrils daily for 7 days. 15.8 mL 0   acetaminophen (TYLENOL) 325 MG tablet Take 2 tablets (650 mg total) by mouth  every 6 (six) hours as needed. (Patient not taking: Reported on 12/17/2022) 36 tablet 0   albuterol (VENTOLIN HFA) 108 (90 Base) MCG/ACT inhaler Inhale 1-2 puffs into the lungs every 6 (six) hours as needed for wheezing or shortness of breath. (Patient not taking: Reported on 12/17/2022) 1 each 0   chlorhexidine (HIBICLENS) 4 % external liquid Apply topically daily as needed. (Patient not taking: Reported on 12/17/2022) 120 mL 1   clindamycin (CLEOCIN-T) 1 % external solution Apply topically 2 (two) times daily. (Patient not taking: Reported on 12/17/2022) 30 mL 1   dextromethorphan (DELSYM) 30 MG/5ML liquid Take 5 mLs (30 mg total) by mouth 2 (two) times daily as needed for cough. (Patient not taking: Reported on 12/17/2022) 89 mL 0   guaiFENesin-codeine 100-10 MG/5ML syrup Take 10 mLs by mouth at bedtime as needed for cough. (Patient not taking: Reported on 12/17/2022) 118 mL 0   levocetirizine (XYZAL) 5 MG tablet Take 1 tablet (5 mg total) by mouth every evening. (Patient not taking: Reported on 12/17/2022) 30 tablet 0   metoCLOPramide (REGLAN) 10 MG tablet Take 1 tablet (10 mg total) by mouth every 8 (eight) hours as needed for nausea. (Patient not taking: Reported on 12/17/2022) 30 tablet 0   ondansetron (ZOFRAN-ODT) 4 MG disintegrating tablet Take 1 tablet (4 mg total) by mouth every 6 (six) hours as needed for nausea. (Patient not taking: Reported on 12/17/2022) 20 tablet 0   scopolamine (TRANSDERM-SCOP) 1 MG/3DAYS Place 1 patch (1.5 mg total) onto the skin every 3 (three) days. (Patient not taking: Reported on 12/17/2022) 4 patch 0   terconazole (TERAZOL 7) 0.4 % vaginal cream Place 1 applicator vaginally at bedtime. (Patient not taking: Reported on 12/17/2022) 45 g 0   No facility-administered medications prior to visit.    Allergies  Allergen Reactions   Peanut-Containing Drug Products Hives       Objective:    BP 121/70 (BP Location: Left Arm, Patient Position: Sitting, Cuff Size: Normal)   Pulse 83    Ht 5\' 5"  (1.651 m)   Wt 233 lb 9.6 oz (106 kg)   LMP 11/22/2022 (Exact Date)   SpO2 98%   BMI 38.87 kg/m  Wt Readings from Last 3 Encounters:  12/17/22 233 lb 9.6 oz (106 kg)  09/09/22 228 lb 6.3 oz (103.6 kg)  05/16/22 228 lb 6.3 oz (103.6 kg)    Physical Exam Vitals and nursing note reviewed.  Constitutional:      Appearance: She is well-developed.  HENT:     Head: Normocephalic and atraumatic.  Neck:     Thyroid: Thyromegaly present.  Cardiovascular:  Rate and Rhythm: Normal rate and regular rhythm.     Heart sounds: Normal heart sounds. No murmur heard.    No friction rub. No gallop.  Pulmonary:     Effort: Pulmonary effort is normal. No tachypnea or respiratory distress.     Breath sounds: Normal breath sounds. No decreased breath sounds, wheezing, rhonchi or rales.  Chest:     Chest wall: No tenderness.  Abdominal:     General: Bowel sounds are normal.     Palpations: Abdomen is soft.  Musculoskeletal:        General: Normal range of motion.     Cervical back: Normal range of motion.  Lymphadenopathy:     Head:     Right side of head: Posterior auricular and occipital adenopathy present.  Skin:    General: Skin is warm and dry.  Neurological:     Mental Status: She is alert and oriented to person, place, and time.     Coordination: Coordination normal.  Psychiatric:        Behavior: Behavior normal. Behavior is cooperative.        Thought Content: Thought content normal.        Judgment: Judgment normal.          Patient has been counseled extensively about nutrition and exercise as well as the importance of adherence with medications and regular follow-up. The patient was given clear instructions to go to ER or return to medical center if symptoms don't improve, worsen or new problems develop. The patient verbalized understanding.   Follow-up: Return if symptoms worsen or fail to improve.   Claiborne Rigg, FNP-BC [facility] [place], [place] [phone]   12/17/2022, 5:21 PM

## 2022-12-19 ENCOUNTER — Other Ambulatory Visit: Payer: Self-pay | Admitting: Nurse Practitioner

## 2022-12-19 DIAGNOSIS — B9689 Other specified bacterial agents as the cause of diseases classified elsewhere: Secondary | ICD-10-CM

## 2022-12-19 LAB — CERVICOVAGINAL ANCILLARY ONLY
Bacterial Vaginitis (gardnerella): POSITIVE — AB
Candida Glabrata: NEGATIVE
Candida Vaginitis: NEGATIVE
Chlamydia: NEGATIVE
Comment: NEGATIVE
Comment: NEGATIVE
Comment: NEGATIVE
Comment: NEGATIVE
Comment: NEGATIVE
Comment: NORMAL
Neisseria Gonorrhea: NEGATIVE
Trichomonas: NEGATIVE

## 2022-12-19 MED ORDER — METRONIDAZOLE 500 MG PO TABS: 500.0000 mg | ORAL_TABLET | Freq: Two times a day (BID) | ORAL | 0 refills | Status: AC

## 2023-01-15 ENCOUNTER — Inpatient Hospital Stay: Admission: RE | Admit: 2023-01-15 | Payer: Medicaid Other | Source: Ambulatory Visit

## 2023-02-09 ENCOUNTER — Other Ambulatory Visit: Payer: Medicaid Other

## 2023-02-24 ENCOUNTER — Other Ambulatory Visit: Payer: Medicaid Other

## 2023-09-10 ENCOUNTER — Other Ambulatory Visit: Payer: Medicaid Other

## 2023-09-24 ENCOUNTER — Telehealth: Payer: Self-pay | Admitting: *Deleted

## 2023-09-24 ENCOUNTER — Ambulatory Visit
Admission: RE | Admit: 2023-09-24 | Discharge: 2023-09-24 | Disposition: A | Discharge status: Home / Self Care | Payer: No Typology Code available for payment source | Source: Ambulatory Visit | Attending: Nurse Practitioner

## 2023-09-24 DIAGNOSIS — R221 Localized swelling, mass and lump, neck: Secondary | ICD-10-CM

## 2023-09-24 NOTE — Telephone Encounter (Signed)
Copied from CRM [id number]. Topic: Referral - Question >> Sep 24, 2023  3:37 PM Phill Myron wrote: Zella Ball who is with [facility] is calling because patient Geidel is currently  there for an ultrasound . She needs a copy of patients insurance card faxed over to [phone] as soon as possible.

## 2023-09-24 NOTE — Telephone Encounter (Signed)
Patient will send over insurance card via MyChart. She was not able to provide at the appointment.

## 2023-10-01 ENCOUNTER — Encounter: Payer: Self-pay | Admitting: Nurse Practitioner

## 2023-10-31 ENCOUNTER — Other Ambulatory Visit: Payer: Self-pay

## 2023-10-31 ENCOUNTER — Ambulatory Visit (HOSPITAL_COMMUNITY)
Admission: EM | Admit: 2023-10-31 | Discharge: 2023-10-31 | Disposition: A | Discharge status: Home / Self Care | Attending: Family Medicine | Admitting: Family Medicine

## 2023-10-31 ENCOUNTER — Encounter (HOSPITAL_COMMUNITY): Payer: Self-pay | Admitting: Emergency Medicine

## 2023-10-31 DIAGNOSIS — R110 Nausea: Secondary | ICD-10-CM | POA: Diagnosis not present

## 2023-10-31 DIAGNOSIS — M654 Radial styloid tenosynovitis [de Quervain]: Secondary | ICD-10-CM | POA: Diagnosis not present

## 2023-10-31 LAB — POCT URINALYSIS DIP (MANUAL ENTRY)
Bilirubin, UA: NEGATIVE
Blood, UA: NEGATIVE
Glucose, UA: NEGATIVE mg/dL
Leukocytes, UA: NEGATIVE
Nitrite, UA: NEGATIVE
Spec Grav, UA: >=1.03 — AB (ref 1.010–1.025)
Urobilinogen, UA: 1 U/dL
pH, UA: 5.5 (ref 5.0–8.0)

## 2023-10-31 LAB — POCT URINE PREGNANCY: Preg Test, Ur: NEGATIVE

## 2023-10-31 MED ORDER — ONDANSETRON 4 MG PO TBDP: 4.0000 mg | ORAL_TABLET | Freq: Three times a day (TID) | ORAL | 0 refills | Status: DC | PRN

## 2023-10-31 MED ORDER — PANTOPRAZOLE SODIUM 40 MG PO TBEC: 40.0000 mg | DELAYED_RELEASE_TABLET | Freq: Every day | ORAL | 0 refills | Status: DC

## 2023-10-31 NOTE — Discharge Instructions (Addendum)
 Wear your thumb brace for the next 1-1.5 weeks. May remove to shower. Use at night also.

## 2023-10-31 NOTE — ED Triage Notes (Signed)
 Pt c/o nausea for the past 3 days with one episode of vomiting.

## 2023-10-31 NOTE — ED Notes (Signed)
 Patient left before thumb spica and paperwork could be provided.

## 2023-12-30 ENCOUNTER — Ambulatory Visit

## 2023-12-30 VITALS — BP 136/89 | HR 62 | Ht 65.0 in | Wt 244.0 lb

## 2023-12-30 DIAGNOSIS — Z3201 Encounter for pregnancy test, result positive: Secondary | ICD-10-CM

## 2023-12-30 DIAGNOSIS — Z3200 Encounter for pregnancy test, result unknown: Secondary | ICD-10-CM

## 2023-12-30 LAB — POCT URINE PREGNANCY: Preg Test, Ur: POSITIVE — AB

## 2023-12-30 MED ORDER — VITAFOL ULTRA 29-0.6-0.4-200 MG PO CAPS: 1.0000 | ORAL_CAPSULE | Freq: Every day | ORAL | 11 refills | Status: DC

## 2023-12-30 NOTE — Progress Notes (Signed)
..  Lauren Costa presents today for UPT. She has no unusual complaints. LMP:12/02/23    OBJECTIVE: Appears well, in no apparent distress.  OB History     Gravida  6   Para  1   Term  1   Preterm      AB  3   Living  1      SAB  0   IAB  3   Ectopic      Multiple  0   Live Births  1          Home UPT Result:Positive In-Office UPT result:Positive I have reviewed the patient's medical, obstetrical, social, and family histories, and medications.   ASSESSMENT: Positive pregnancy test  PLAN Prenatal care to be completed at: [facility] Advised of abnormal signs to monitor for, provided BP cuff from office and advised to monitor. Provided safe med list PNV sent to pharmacy

## 2024-01-28 ENCOUNTER — Ambulatory Visit: Payer: Self-pay | Admitting: *Deleted

## 2024-01-28 ENCOUNTER — Other Ambulatory Visit (HOSPITAL_COMMUNITY)
Admission: RE | Admit: 2024-01-28 | Discharge: 2024-01-28 | Disposition: A | Discharge status: Home / Self Care | Source: Ambulatory Visit | Attending: Obstetrics and Gynecology | Admitting: Obstetrics and Gynecology

## 2024-01-28 ENCOUNTER — Other Ambulatory Visit: Payer: Self-pay | Admitting: Obstetrics and Gynecology

## 2024-01-28 ENCOUNTER — Other Ambulatory Visit (INDEPENDENT_AMBULATORY_CARE_PROVIDER_SITE_OTHER): Payer: Self-pay

## 2024-01-28 VITALS — BP 129/84 | HR 65 | Wt 244.6 lb

## 2024-01-28 DIAGNOSIS — Z3A01 Less than 8 weeks gestation of pregnancy: Secondary | ICD-10-CM

## 2024-01-28 DIAGNOSIS — Z3A08 8 weeks gestation of pregnancy: Secondary | ICD-10-CM

## 2024-01-28 DIAGNOSIS — O3680X0 Pregnancy with inconclusive fetal viability, not applicable or unspecified: Secondary | ICD-10-CM

## 2024-01-28 DIAGNOSIS — Z3480 Encounter for supervision of other normal pregnancy, unspecified trimester: Secondary | ICD-10-CM

## 2024-01-28 DIAGNOSIS — Z3481 Encounter for supervision of other normal pregnancy, first trimester: Secondary | ICD-10-CM | POA: Diagnosis not present

## 2024-01-28 MED ORDER — DOXYLAMINE-PYRIDOXINE 10-10 MG PO TBEC: 2.0000 | DELAYED_RELEASE_TABLET | Freq: Every day | ORAL | 5 refills | Status: DC

## 2024-01-28 MED ORDER — PROMETHAZINE HCL 25 MG PO TABS: 25.0000 mg | ORAL_TABLET | Freq: Four times a day (QID) | ORAL | 1 refills | Status: DC | PRN

## 2024-01-28 NOTE — Progress Notes (Signed)
 New OB Intake  I connected with Lauren Costa  on 01/28/24 at  8:15 AM EDT by In Person Visit and verified that I am speaking with the correct person using two identifiers. Nurse is located at [facility] and pt is located at [place].  I discussed the limitations, risks, security and privacy concerns of performing an evaluation and management service by telephone and the availability of in person appointments. I also discussed with the patient that there may be a patient responsible charge related to this service. The patient expressed understanding and agreed to proceed.  I explained I am completing New OB Intake today. We discussed EDD of 09/07/2024, by Last Menstrual Period. Pt is [id number]. I reviewed her allergies, medications and Medical/Surgical/OB history.    Patient Active Problem List   Diagnosis Date Noted   Supervision of other normal pregnancy, antepartum 01/28/2024   GBS (group b Streptococcus) UTI complicating pregnancy, first trimester 03/07/2022   Gonorrhea affecting pregnancy 03/06/2022   Morning sickness 01/11/2019   Intrauterine pregnancy 04/19/2018   Nausea/vomiting in pregnancy 04/19/2018   Bacterial vaginitis 04/19/2018   Mild tetrahydrocannabinol (THC) abuse 07/17/2016   Supervision of normal first pregnancy 03/19/2016   Bilateral breast lump 03/19/2016     Concerns addressed today  Delivery Plans Plans to deliver at [facility]. Discussed the nature of our practice with multiple providers including residents and students. Due to the size of the practice, the delivering provider may not be the same as those providing prenatal care.   Patient is not interested in water  birth.  MyChart/Babyscripts MyChart access verified. I explained pt will have some visits in office and some virtually. Babyscripts instructions given and order placed. Patient verifies receipt of registration text/e-mail. Account successfully created and app downloaded. If patient is a candidate for  Optimized scheduling, add to sticky note.   Blood Pressure Cuff/Weight Scale Pt has BP cuff at home.Explained after first prenatal appt pt will check weekly and document in Babyscripts. Patient does not have weight scale; patient may purchase if they desire to track weight weekly in Babyscripts.  Anatomy US  Explained first scheduled US  will be around 19 weeks. Anatomy US  scheduled for TBD at TBD.  Is patient a candidate for Babyscripts Optimization? No, due to Risk Factors   First visit review I reviewed new OB appt with patient. Explained pt will be seen by Dr. Dodie Frees at first visit. Discussed [facility] [place] genetic screening with patient. Requests Panorama and Horizon.. Routine prenatal labs OB Urine, GC/CC only collected at today's visit. Initial OB labs deferred to New OB appt.   Last Pap Diagnosis  Date Value Ref Range Status  06/25/2021      - Negative for intraepithelial lesion or malignancy (NILM)    Donette Furlong, RN 01/28/2024  8:58 AM

## 2024-01-28 NOTE — Patient Instructions (Signed)

## 2024-01-29 ENCOUNTER — Ambulatory Visit: Payer: Self-pay | Admitting: Obstetrics and Gynecology

## 2024-01-29 LAB — CERVICOVAGINAL ANCILLARY ONLY
Chlamydia: NEGATIVE
Comment: NEGATIVE
Comment: NORMAL
Neisseria Gonorrhea: NEGATIVE

## 2024-01-30 LAB — CULTURE, OB URINE

## 2024-01-30 LAB — URINE CULTURE, OB REFLEX

## 2024-02-08 ENCOUNTER — Other Ambulatory Visit: Payer: Self-pay

## 2024-02-08 MED ORDER — PRENATAL 28-0.8 MG PO TABS: 1.0000 | ORAL_TABLET | Freq: Every day | ORAL | 12 refills | Status: DC

## 2024-02-09 ENCOUNTER — Telehealth: Payer: Self-pay | Admitting: Family Medicine

## 2024-02-09 NOTE — Telephone Encounter (Signed)
 2nd attempt contacted pt to confirmed appt mailbox full!

## 2024-02-09 NOTE — Telephone Encounter (Signed)
 Contacted pt no vm (mailbox full) to leave a message to confirmed appt

## 2024-02-10 ENCOUNTER — Ambulatory Visit: Admitting: Family Medicine

## 2024-02-18 ENCOUNTER — Other Ambulatory Visit: Payer: Self-pay

## 2024-02-18 MED ORDER — METRONIDAZOLE 500 MG PO TABS: 500.0000 mg | ORAL_TABLET | Freq: Two times a day (BID) | ORAL | 0 refills | Status: DC

## 2024-02-25 ENCOUNTER — Other Ambulatory Visit (HOSPITAL_COMMUNITY)
Admission: RE | Admit: 2024-02-25 | Discharge: 2024-02-25 | Disposition: A | Discharge status: Home / Self Care | Source: Ambulatory Visit | Attending: Obstetrics and Gynecology | Admitting: Obstetrics and Gynecology

## 2024-02-25 ENCOUNTER — Ambulatory Visit: Payer: Self-pay | Admitting: Obstetrics and Gynecology

## 2024-02-25 VITALS — BP 122/79 | HR 82 | Wt 235.0 lb

## 2024-02-25 DIAGNOSIS — Z3A12 12 weeks gestation of pregnancy: Secondary | ICD-10-CM

## 2024-02-25 DIAGNOSIS — Z8679 Personal history of other diseases of the circulatory system: Secondary | ICD-10-CM

## 2024-02-25 DIAGNOSIS — Z3480 Encounter for supervision of other normal pregnancy, unspecified trimester: Secondary | ICD-10-CM

## 2024-02-25 DIAGNOSIS — O99211 Obesity complicating pregnancy, first trimester: Secondary | ICD-10-CM

## 2024-02-25 DIAGNOSIS — Z124 Encounter for screening for malignant neoplasm of cervix: Secondary | ICD-10-CM | POA: Diagnosis present

## 2024-02-25 DIAGNOSIS — O99210 Obesity complicating pregnancy, unspecified trimester: Secondary | ICD-10-CM | POA: Insufficient documentation

## 2024-02-25 MED ORDER — ASPIRIN 81 MG PO TBEC: 81.0000 mg | DELAYED_RELEASE_TABLET | Freq: Every day | ORAL | 2 refills | Status: DC

## 2024-02-25 NOTE — Progress Notes (Signed)
 Chief Complaint  Patient presents with   Initial Prenatal Visit    Subjective:   Lauren Costa is a 28 y.o. G5P1031 at [redacted]w[redacted]d by LMP c/w 8 wk ultrasound being seen today for her first obstetrical visit.    Her obstetrical history is significant for obesity, ?history of chronic hypertension- reports home BP has been normal, no meds. Patient is undecided on intent to breast feed. Pregnancy history fully reviewed.  Patient reports no complaints.  HISTORY: OB History  Gravida Para Term Preterm AB Living  5 1 1  0 3 1  SAB IAB Ectopic Multiple Live Births  0 3 0 0 1    # Outcome Date GA Lbr Len/2nd Weight Sex Type Anes PTL Lv  5 Current           4 IAB 03/25/21     TAB     3 IAB 2020          2 IAB 05/2018          1 Term 10/21/16 [redacted]w[redacted]d 28:00 / 00:28 7 lb 7.6 oz (3.391 kg) F Vag-Spont EPI  LIV     Name: Santy,GIRL Santana     Apgar1: 8  Apgar5: 9     Last pap smear: Lab Results  Component Value Date   DIAGPAP  06/25/2021    - Negative for intraepithelial lesion or malignancy (NILM)     Past Medical History:  Diagnosis Date   Anxiety    as a child   Bilateral breast lump 03/19/2016   Chronic kidney disease    kidney infection   Past Surgical History:  Procedure Laterality Date   DENTAL SURGERY     Family History  Problem Relation Age of Onset   Fibroids Mother    Healthy Father    Cancer Maternal Aunt    Cancer Maternal Uncle    Alcohol abuse Neg Hx    Arthritis Neg Hx    Asthma Neg Hx    Birth defects Neg Hx    COPD Neg Hx    Depression Neg Hx    Diabetes Neg Hx    Drug abuse Neg Hx    Early death Neg Hx    Hearing loss Neg Hx    Heart disease Neg Hx    Hyperlipidemia Neg Hx    Hypertension Neg Hx    Kidney disease Neg Hx    Learning disabilities Neg Hx    Mental illness Neg Hx    Mental retardation Neg Hx    Miscarriages / Stillbirths Neg Hx    Stroke Neg Hx    Vision loss Neg Hx    Varicose Veins Neg Hx    Social History   Tobacco Use    Smoking status: Former    Current packs/day: 0.15    Average packs/day: 0.2 packs/day for 8.0 years (1.2 ttl pk-yrs)    Types: Cigarettes   Smokeless tobacco: Former  Building services engineer status: Never Used  Substance Use Topics   Alcohol use: Not Currently   Drug use: Not Currently    Types: Marijuana   Allergies  Allergen Reactions   Peanut-Containing Drug Products Hives   Current Outpatient Medications on File Prior to Visit  Medication Sig Dispense Refill   Prenatal 28-0.8 MG TABS Take 1 tablet by mouth daily. 30 tablet 12   Doxylamine -Pyridoxine  (DICLEGIS ) 10-10 MG TBEC Take 2 tablets by mouth at bedtime. If symptoms persist, add one tablet in the morning  and one in the afternoon 100 tablet 5   fluticasone  (FLONASE ) 50 MCG/ACT nasal spray Place 1 spray into both nostrils daily for 7 days. 15.8 mL 0   ibuprofen  (ADVIL ) 600 MG tablet Take 1 tablet (600 mg total) by mouth every 6 (six) hours as needed. 30 tablet 0   metroNIDAZOLE  (FLAGYL ) 500 MG tablet Take 1 tablet (500 mg total) by mouth 2 (two) times daily. 14 tablet 0   ondansetron  (ZOFRAN -ODT) 4 MG disintegrating tablet Take 1 tablet (4 mg total) by mouth every 8 (eight) hours as needed for nausea or vomiting. (Patient not taking: Reported on 01/28/2024) 15 tablet 0   pantoprazole  (PROTONIX ) 40 MG tablet Take 1 tablet (40 mg total) by mouth daily. (Patient not taking: Reported on 01/28/2024) 30 tablet 0   promethazine  (PHENERGAN ) 25 MG tablet Take 1 tablet (25 mg total) by mouth every 6 (six) hours as needed for nausea or vomiting. 30 tablet 1   topiramate  (TOPAMAX ) 50 MG tablet Take 0.5 tablets (25 mg total) by mouth 2 (two) times daily. May increase to a whole tablet twice per day if needed 30 tablet 1   No current facility-administered medications on file prior to visit.     Exam   Vitals:   02/25/24 0945  BP: 122/79  Pulse: 82  Weight: 235 lb (106.6 kg)  Body mass index is 39.11 kg/m.  Fetal Heart Rate (bpm):  150  General:  Alert, oriented and cooperative. Patient is in no acute distress.  Breast: deferred  Cardiovascular: Normal heart rate noted  Respiratory: Normal respiratory effort, no problems with respiration noted  Abdomen: Soft, gravid, appropriate for gestational age.  Pain/Pressure: Absent     Pelvic: EGBUS within normal limits, vagina with normal discharge, cervix closed without lesions, pap collected, chaperone present  Extremities: Normal range of motion.     Mental Status: Normal mood and affect. Normal behavior. Normal judgment and thought content.    Assessment:   Pregnancy: [id number] Patient Active Problem List   Diagnosis Date Noted   Obesity in pregnancy 02/25/2024   Supervision of other normal pregnancy, antepartum 01/28/2024   GBS (group b Streptococcus) UTI complicating pregnancy, first trimester 03/07/2022   Gonorrhea affecting pregnancy 03/06/2022   Morning sickness 01/11/2019   Intrauterine pregnancy 04/19/2018   Nausea/vomiting in pregnancy 04/19/2018   Bacterial vaginitis 04/19/2018   Mild tetrahydrocannabinol (THC) abuse 07/17/2016   Supervision of normal first pregnancy 03/19/2016   Bilateral breast lump 03/19/2016     Plan:  1. Supervision of other normal pregnancy, antepartum (Primary) Anticipatory guidance - CBC/D/Plt+RPR+Rh+ABO+RubIgG... - HgB A1c - Comp Met (CMET) - PANORAMA PRENATAL TEST - HORIZON Basic Panel  2. Obesity in pregnancy Aspirin  rx'ed - aspirin  EC 81 MG tablet; Take 1 tablet (81 mg total) by mouth at bedtime. Start taking when you are [redacted] weeks pregnant for rest of pregnancy for prevention of preeclampsia  Dispense: 300 tablet; Refill: 2  3. [redacted] weeks gestation of pregnancy  4. Cervical cancer screening  - Cytology - PAP  5. History of chronic hypertension Normal today. Patient reports no meds just states it has been high in the past sometimes    Initial labs drawn. Continue prenatal vitamins. Genetic Screening discussed:  NIPS, carrier screening and AFP, requested. Ultrasound discussed; fetal anatomic survey: requested. Problem list reviewed and updated. The nature of [facility] with multiple MDs and other Advanced Practice Providers was explained to patient; also emphasized that residents, students are part  of our team. Routine obstetric precautions reviewed. Return in about 4 weeks (around 03/24/2024).  Rollo ONEIDA Bring, MD, FACOG Obstetrician & Gynecologist, [facility], [facility]

## 2024-02-25 NOTE — Progress Notes (Signed)
 Pt is concerned with weight loss since last appt.

## 2024-02-26 ENCOUNTER — Ambulatory Visit: Payer: Self-pay | Admitting: Obstetrics and Gynecology

## 2024-02-26 DIAGNOSIS — Z3480 Encounter for supervision of other normal pregnancy, unspecified trimester: Secondary | ICD-10-CM

## 2024-02-26 LAB — CBC/D/PLT+RPR+RH+ABO+RUBIGG...
Antibody Screen: NEGATIVE
Basophils Absolute: 0 x10E3/uL (ref 0.0–0.2)
Basos: 0 %
EOS (ABSOLUTE): 0.1 x10E3/uL (ref 0.0–0.4)
Eos: 1 %
HCV Ab: NONREACTIVE
HIV Screen 4th Generation wRfx: NONREACTIVE
Hematocrit: 36.9 % (ref 34.0–46.6)
Hemoglobin: 11.7 g/dL (ref 11.1–15.9)
Hepatitis B Surface Ag: NEGATIVE
Immature Grans (Abs): 0 x10E3/uL (ref 0.0–0.1)
Immature Granulocytes: 0 %
Lymphocytes Absolute: 1.9 x10E3/uL (ref 0.7–3.1)
Lymphs: 25 %
MCH: 27.1 pg (ref 26.6–33.0)
MCHC: 31.7 g/dL (ref 31.5–35.7)
MCV: 86 fL (ref 79–97)
Monocytes Absolute: 0.6 x10E3/uL (ref 0.1–0.9)
Monocytes: 8 %
Neutrophils Absolute: 4.8 x10E3/uL (ref 1.4–7.0)
Neutrophils: 66 %
Platelets: 248 x10E3/uL (ref 150–450)
RBC: 4.31 x10E6/uL (ref 3.77–5.28)
RDW: 14.6 % (ref 11.7–15.4)
RPR Ser Ql: NONREACTIVE
Rh Factor: POSITIVE
Rubella Antibodies, IGG: 2.99 {index} (ref >0.99)
WBC: 7.3 x10E3/uL (ref 3.4–10.8)

## 2024-02-26 LAB — COMPREHENSIVE METABOLIC PANEL WITH GFR
ALT: 14 IU/L (ref 0–32)
AST: 11 IU/L (ref 0–40)
Albumin: 4.1 g/dL (ref 4.0–5.0)
Alkaline Phosphatase: 92 IU/L (ref 44–121)
BUN/Creatinine Ratio: 14 (ref 9–23)
BUN: 8 mg/dL (ref 6–20)
Bilirubin Total: 0.3 mg/dL (ref 0.0–1.2)
CO2: 20 mmol/L (ref 20–29)
Calcium: 9.4 mg/dL (ref 8.7–10.2)
Chloride: 103 mmol/L (ref 96–106)
Creatinine, Ser: 0.59 mg/dL (ref 0.57–1.00)
Globulin, Total: 3.1 g/dL (ref 1.5–4.5)
Glucose: 85 mg/dL (ref 70–99)
Potassium: 4.3 mmol/L (ref 3.5–5.2)
Sodium: 138 mmol/L (ref 134–144)
Total Protein: 7.2 g/dL (ref 6.0–8.5)
eGFR: 127 mL/min/1.73 (ref >59)

## 2024-02-26 LAB — HEMOGLOBIN A1C
Est. average glucose Bld gHb Est-mCnc: 114 mg/dL
Hgb A1c MFr Bld: 5.6 % (ref 4.8–5.6)

## 2024-02-26 LAB — HCV INTERPRETATION

## 2024-03-01 LAB — CYTOLOGY - PAP: Diagnosis: NEGATIVE

## 2024-03-05 LAB — HORIZON CUSTOM: REPORT SUMMARY: NEGATIVE

## 2024-03-06 LAB — PANORAMA PRENATAL TEST FULL PANEL:PANORAMA TEST PLUS 5 ADDITIONAL MICRODELETIONS: FETAL FRACTION: 4.4

## 2024-03-24 ENCOUNTER — Encounter: Admitting: Physician Assistant

## 2024-03-24 NOTE — Progress Notes (Deleted)
   PRENATAL VISIT NOTE  Subjective:  Lauren Costa is a 28 y.o. G5P1031 at [redacted]w[redacted]d being seen today for ongoing prenatal care.  She is currently monitored for the following issues for this {Blank single:19197::high-risk,low-risk} pregnancy and has Bilateral breast lump; Mild tetrahydrocannabinol (THC) abuse; Gonorrhea affecting pregnancy; Supervision of other normal pregnancy, antepartum; and Obesity in pregnancy on their problem list.  Patient reports {sx:14538}.   .  .   . Denies leaking of fluid.   The following portions of the patient's history were reviewed and updated as appropriate: allergies, current medications, past family history, past medical history, past social history, past surgical history and problem list.   Objective:    There were no vitals filed for this visit.  Fetal Status:           General: Alert, oriented and cooperative. Patient is in no acute distress.  Skin: Skin is warm and dry. No rash noted.   Cardiovascular: Normal heart rate noted  Respiratory: Normal respiratory effort, no problems with respiration noted  Abdomen: Soft, gravid, appropriate for gestational age.        Pelvic: Cervical exam deferred        Extremities: Normal range of motion.     Mental Status: Normal mood and affect. Normal behavior. Normal judgment and thought content.   Assessment and Plan:  Pregnancy: G5P1031 at [redacted]w[redacted]d  1. Supervision of other normal pregnancy, antepartum (Primary) Patient doing well, feeling regular fetal movement  BP, FHR, FH appropriate  2. [redacted] weeks gestation of pregnancy Anticipatory guidance about next visits/weeks of pregnancy given.   Preterm labor symptoms and general obstetric precautions including but not limited to vaginal bleeding, contractions, leaking of fluid and fetal movement were reviewed in detail with the patient. Please refer to After Visit Summary for other counseling recommendations.   No follow-ups on file.  Future Appointments   Date Time Provider Department Center  03/24/2024  8:15 AM Aloysuis Ribaudo E, PA-C [facility] None  04/22/2024 10:00 AM [facility] PROVIDER 1 [facility]  04/22/2024 10:30 AM [facility] US1 [facility]    Jorene FORBES Moats, PA-C

## 2024-04-04 ENCOUNTER — Ambulatory Visit: Admitting: Obstetrics and Gynecology

## 2024-04-04 ENCOUNTER — Encounter: Payer: Self-pay | Admitting: Obstetrics and Gynecology

## 2024-04-04 VITALS — BP 124/67 | HR 73 | Wt 235.2 lb

## 2024-04-04 DIAGNOSIS — Z3480 Encounter for supervision of other normal pregnancy, unspecified trimester: Secondary | ICD-10-CM

## 2024-04-04 DIAGNOSIS — Z3A17 17 weeks gestation of pregnancy: Secondary | ICD-10-CM

## 2024-04-04 DIAGNOSIS — Z8679 Personal history of other diseases of the circulatory system: Secondary | ICD-10-CM

## 2024-04-04 NOTE — Progress Notes (Signed)
Pt declines AFP today.   

## 2024-04-04 NOTE — Progress Notes (Signed)
   PRENATAL VISIT NOTE  Subjective:  Lauren Costa is a 28 y.o. G5P1031 at [redacted]w[redacted]d being seen today for ongoing prenatal care.  She is currently monitored for the following issues for this low-risk pregnancy and has Bilateral breast lump; Mild tetrahydrocannabinol (THC) abuse; Gonorrhea affecting pregnancy; Supervision of other normal pregnancy, antepartum; and Obesity in pregnancy on their problem list.  Patient reports no complaints.  Contractions: Not present. Vag. Bleeding: None.  Movement: Present. Denies leaking of fluid.   The following portions of the patient's history were reviewed and updated as appropriate: allergies, current medications, past family history, past medical history, past social history, past surgical history and problem list.   Objective:    Vitals:   04/04/24 1557  BP: 124/67  Pulse: 73  Weight: 235 lb 3.2 oz (106.7 kg)    Fetal Status:  Fetal Heart Rate (bpm): 137   Movement: Present    General: Alert, oriented and cooperative. Patient is in no acute distress.  Skin: Skin is warm and dry. No rash noted.   Cardiovascular: Normal heart rate noted  Respiratory: Normal respiratory effort, no problems with respiration noted  Abdomen: Soft, gravid, appropriate for gestational age.  Pain/Pressure: Absent     Pelvic: Cervical exam deferred        Extremities: Normal range of motion.  Edema: None  Mental Status: Normal mood and affect. Normal behavior. Normal judgment and thought content.   Assessment and Plan:  Pregnancy: G5P1031 at [redacted]w[redacted]d  1. Supervision of other normal pregnancy, antepartum (Primary) Patient doing well, feeling regular fetal movement  BP, FHR, FH appropriate  2. [redacted] weeks gestation of pregnancy Anticipatory guidance about next visits/weeks of pregnancy given.  Declined AFP today  Anatomy scan 9/12  3. History of chronic hypertension Normotensive   Preterm labor symptoms and general obstetric precautions including but not limited to  vaginal bleeding, contractions, leaking of fluid and fetal movement were reviewed in detail with the patient. Please refer to After Visit Summary for other counseling recommendations.   Return in about 4 weeks (around 05/02/2024) for OB VISIT (MD or APP).  Future Appointments  Date Time Provider Department Center  04/22/2024 10:00 AM [facility] PROVIDER 1 [facility]  04/22/2024 10:30 AM [facility] US1 [facility]    Nidia Daring, FNP

## 2024-04-11 ENCOUNTER — Inpatient Hospital Stay (HOSPITAL_BASED_OUTPATIENT_CLINIC_OR_DEPARTMENT_OTHER)

## 2024-04-11 ENCOUNTER — Encounter (HOSPITAL_COMMUNITY): Payer: Self-pay | Admitting: Obstetrics & Gynecology

## 2024-04-11 ENCOUNTER — Inpatient Hospital Stay (HOSPITAL_COMMUNITY)
Admission: AD | Admit: 2024-04-11 | Discharge: 2024-04-11 | Disposition: A | Discharge status: Home / Self Care | Attending: Obstetrics & Gynecology | Admitting: Obstetrics & Gynecology

## 2024-04-11 DIAGNOSIS — R109 Unspecified abdominal pain: Secondary | ICD-10-CM

## 2024-04-11 DIAGNOSIS — Z3A18 18 weeks gestation of pregnancy: Secondary | ICD-10-CM

## 2024-04-11 DIAGNOSIS — O26892 Other specified pregnancy related conditions, second trimester: Secondary | ICD-10-CM | POA: Diagnosis not present

## 2024-04-11 LAB — URINALYSIS, ROUTINE W REFLEX MICROSCOPIC
Bilirubin Urine: NEGATIVE
Glucose, UA: NEGATIVE mg/dL
Hgb urine dipstick: NEGATIVE
Ketones, ur: NEGATIVE mg/dL
Leukocytes,Ua: NEGATIVE
Nitrite: NEGATIVE
Protein, ur: NEGATIVE mg/dL
Specific Gravity, Urine: 1.019 (ref 1.005–1.030)
pH: 7 (ref 5.0–8.0)

## 2024-04-11 LAB — WET PREP, GENITAL
Clue Cells Wet Prep HPF POC: NONE SEEN
Sperm: NONE SEEN
Trich, Wet Prep: NONE SEEN
WBC, Wet Prep HPF POC: <10 (ref <10)
Yeast Wet Prep HPF POC: NONE SEEN

## 2024-04-11 MED ORDER — IBUPROFEN 600 MG PO TABS
600.0000 mg | ORAL_TABLET | Freq: Once | ORAL | Status: AC
  Administered 2024-04-11: 600 mg via ORAL
  Filled 2024-04-11: qty 1

## 2024-04-11 NOTE — MAU Note (Signed)
 Lauren Costa is a 28 y.o. at [redacted]w[redacted]d here in MAU reporting: started having some cramping yesterday. Feels more intense this morning a little better now but it still hurts Denies any vag bleeding and reports normal discharge.   LMP:  Onset of complaint: yesterday Pain score: 6 Vitals:   04/11/24 1226  BP: (!) 146/70  Pulse: 90  Resp: 18  Temp: 98.8 F (37.1 C)     FHT: 140  Lab orders placed from triage: u/a

## 2024-04-11 NOTE — Progress Notes (Signed)
Pt just returned from US

## 2024-04-11 NOTE — MAU Provider Note (Signed)
 History     CSN: [id number]  Arrival date and time: 04/11/24 1150   Event Date/Time   First Provider Initiated Contact with Patient 04/11/24 1258      Chief Complaint  Patient presents with   Abdominal Pain   HPI  Lauren Costa is a 28 y.o. female [phone] @ [redacted]w[redacted]d here in MAU with complaints of abdominal pain. She has no vomiting and no diarrhea.  She reports Lower abdominal pain that wraps around to both sides of her lower back.  She reports worsening pain today. No bleeding.  Laying on her side makes it better. No recent trauma no changes in activity recently.   She has never had this pain before. Has not had a baby in 7 years and wants to be sure everything is ok. TAB x 3  OB History     Gravida  5   Para  1   Term  1   Preterm  0   AB  3   Living  1      SAB  0   IAB  3   Ectopic      Multiple  0   Live Births  1           Past Medical History:  Diagnosis Date   Anxiety    as a child   Bilateral breast lump 03/19/2016   Chronic kidney disease    kidney infection    Past Surgical History:  Procedure Laterality Date   DENTAL SURGERY      Family History  Problem Relation Age of Onset   Fibroids Mother    Healthy Father    Cancer Maternal Aunt    Cancer Maternal Uncle    Alcohol abuse Neg Hx    Arthritis Neg Hx    Asthma Neg Hx    Birth defects Neg Hx    COPD Neg Hx    Depression Neg Hx    Diabetes Neg Hx    Drug abuse Neg Hx    Early death Neg Hx    Hearing loss Neg Hx    Heart disease Neg Hx    Hyperlipidemia Neg Hx    Hypertension Neg Hx    Kidney disease Neg Hx    Learning disabilities Neg Hx    Mental illness Neg Hx    Mental retardation Neg Hx    Miscarriages / Stillbirths Neg Hx    Stroke Neg Hx    Vision loss Neg Hx    Varicose Veins Neg Hx     Social History   Tobacco Use   Smoking status: Former    Current packs/day: 0.15    Average packs/day: 0.2 packs/day for 8.0 years (1.2 ttl pk-yrs)    Types:  Cigarettes   Smokeless tobacco: Former  Building services engineer status: Never Used  Substance Use Topics   Alcohol use: Not Currently   Drug use: Not Currently    Types: Marijuana    Comment: Last smoked 2-3 years ago    Allergies:  Allergies  Allergen Reactions   Peanut-Containing Drug Products Hives    Medications Prior to Admission  Medication Sig Dispense Refill Last Dose/Taking   aspirin  EC 81 MG tablet Take 1 tablet (81 mg total) by mouth at bedtime. Start taking when you are [redacted] weeks pregnant for rest of pregnancy for prevention of preeclampsia 300 tablet 2 04/04/2024   Doxylamine -Pyridoxine  (DICLEGIS ) 10-10 MG TBEC Take 2 tablets by mouth at bedtime. If  symptoms persist, add one tablet in the morning and one in the afternoon (Patient not taking: Reported on 04/04/2024) 100 tablet 5    fluticasone  (FLONASE ) 50 MCG/ACT nasal spray Place 1 spray into both nostrils daily for 7 days. 15.8 mL 0    ibuprofen  (ADVIL ) 600 MG tablet Take 1 tablet (600 mg total) by mouth every 6 (six) hours as needed. (Patient not taking: Reported on 04/04/2024) 30 tablet 0    metroNIDAZOLE  (FLAGYL ) 500 MG tablet Take 1 tablet (500 mg total) by mouth 2 (two) times daily. (Patient not taking: Reported on 04/04/2024) 14 tablet 0    ondansetron  (ZOFRAN -ODT) 4 MG disintegrating tablet Take 1 tablet (4 mg total) by mouth every 8 (eight) hours as needed for nausea or vomiting. (Patient not taking: Reported on 01/28/2024) 15 tablet 0    pantoprazole  (PROTONIX ) 40 MG tablet Take 1 tablet (40 mg total) by mouth daily. (Patient not taking: Reported on 01/28/2024) 30 tablet 0    Prenatal 28-0.8 MG TABS Take 1 tablet by mouth daily. (Patient taking differently: Take 2 tablets by mouth daily. Taking Gummies) 30 tablet 12    promethazine  (PHENERGAN ) 25 MG tablet Take 1 tablet (25 mg total) by mouth every 6 (six) hours as needed for nausea or vomiting. (Patient not taking: Reported on 04/04/2024) 30 tablet 1    topiramate  (TOPAMAX )  50 MG tablet Take 0.5 tablets (25 mg total) by mouth 2 (two) times daily. May increase to a whole tablet twice per day if needed (Patient not taking: Reported on 04/04/2024) 30 tablet 1    Results for orders placed or performed during the hospital encounter of 04/11/24 (from the past 48 hours)  Urinalysis, Routine w reflex microscopic -Urine, Clean Catch     Status: Abnormal   Collection Time: 04/11/24 12:29 PM  Result Value Ref Range   Color, Urine YELLOW YELLOW   APPearance HAZY (A) CLEAR   Specific Gravity, Urine 1.019 1.005 - 1.030   pH 7.0 5.0 - 8.0   Glucose, UA NEGATIVE NEGATIVE mg/dL   Hgb urine dipstick NEGATIVE NEGATIVE   Bilirubin Urine NEGATIVE NEGATIVE   Ketones, ur NEGATIVE NEGATIVE mg/dL   Protein, ur NEGATIVE NEGATIVE mg/dL   Nitrite NEGATIVE NEGATIVE   Leukocytes,Ua NEGATIVE NEGATIVE    Comment: Performed at [facility], [address], [place], [place] [address]  Wet prep, genital     Status: None   Collection Time: 04/11/24  1:18 PM   Specimen: Vaginal  Result Value Ref Range   Yeast Wet Prep HPF POC NONE SEEN NONE SEEN   Trich, Wet Prep NONE SEEN NONE SEEN   Clue Cells Wet Prep HPF POC NONE SEEN NONE SEEN   WBC, Wet Prep HPF POC <10 <10   Sperm NONE SEEN     Comment: Performed at [facility], [address], [place], [place] [address]    US  MFM OB LIMITED Result Date: 04/11/2024 ----------------------------------------------------------------------  OBSTETRICS REPORT                       (Signed Final 04/11/2024 02:30 pm) ---------------------------------------------------------------------- Patient Info  ID #:       [id number]                          D.O.B.:  [birth year] (27 yrs)(F)  Name:       Lauren Costa  Visit Date: 04/11/2024 01:43 pm ---------------------------------------------------------------------- Performed By  Attending:        Fredia Fresh MD        Ref. Address:     [address]                                                             [place], [place]                                                             [address]  Performed By:     Jonette Nap        [facility]  Referred By:      [facility] ---------------------------------------------------------------------- Orders  #  Description                           Code        Ordered By  1  US  MFM OB LIMITED                     23184.98    Rayshaun Needle ----------------------------------------------------------------------  #  Order #                     Accession #                Episode #  1  [id number]                   [id number]                 [id number] ---------------------------------------------------------------------- Indications  [redacted] weeks gestation of pregnancy                Z3A.18  Abdominal pain in pregnancy                    O99.89 ---------------------------------------------------------------------- Fetal Evaluation  Num Of Fetuses:         1  Fetal Heart Rate(bpm):  141  Cardiac Activity:       Observed  Presentation:           Transverse, head to maternal right  Placenta:               Anterior  P. Cord Insertion:  Not well visualized  Amniotic Fluid  AFI FV:      Within normal limits                              Largest Pocket(cm)                              4.8 ---------------------------------------------------------------------- Biometry  LV:        7.8  mm ---------------------------------------------------------------------- OB History  Gravidity:    5         Term:   1        Prem:   0        SAB:   0  TOP:          3       Ectopic:  0        Living: 1 ---------------------------------------------------------------------- Gestational Age  LMP:           18w 5d        Date:  12/02/23                  EDD:   09/07/24  Best:          18w 5d     Det. By:  LMP  (12/02/23)          EDD:    09/07/24 ---------------------------------------------------------------------- Anatomy  Cranium:               Visualized             Kidneys:                Visualized  Heart:                 Visualized             Bladder:                Visualized  Stomach:               Visualized ---------------------------------------------------------------------- Cervix Uterus Adnexa  Cervix  Length:            4.7  cm.  Measured transvaginally ---------------------------------------------------------------------- Recommendations  Patient is being evaluated at the MAU with c/o abdominal  pain. No history of vaginal bleeding.  A limited ultrasound study was performed. Amniotic fluid is  normal and good fetal activity is seen.  Placenta is anterior and there is no evidence of retroplacental  hemorrhage. On transabdominal scan, the cervix looks long  and closed.  Transvaginal ultrasound was performed to rule out vasa  previa (suspicion on transabdominal scan). The cervix  measures 4.7 cm, which is normal. No evidence of previa or  vasa previa. ----------------------------------------------------------------------                 Fredia Fresh, MD Electronically Signed Final Report   04/11/2024 02:30 pm ----------------------------------------------------------------------     Review of Systems  Constitutional:  Negative for fever.  Gastrointestinal:  Positive for abdominal pain. Negative for nausea and vomiting.  Genitourinary:  Negative for vaginal bleeding and vaginal discharge.   Physical Exam   Blood pressure 129/64, pulse 82, temperature 98.1 F (36.7 C), temperature source Oral, resp. rate 18, height 5' 5 (1.651 m), weight 106.6 kg, last menstrual period 12/02/2023, SpO2 99%, unknown if currently breastfeeding.  Physical Exam Constitutional:      Appearance: She is well-developed.  Abdominal:     Palpations: Abdomen is soft.     Tenderness: There is no abdominal tenderness.  Genitourinary:    Comments:  Cervix long, closed, posterior Exam by Delon Emms, NP Skin:    General: Skin is warm.  Neurological:     Mental Status: She is alert.    MAU Course  Procedures  MDM  US  normal- discussed US  report with Dr. Arna. Urine culture pending. Ibuprofen  600 mg PO given, with improvement.   Assessment and Plan   A:  1. Abdominal pain during pregnancy in second trimester   2. [redacted] weeks gestation of pregnancy    P:  Dc home Pregnancy support belt may be helpful Return to MAU if symptoms worsen  Labresha Mellor, Delon FERNS, NP 04/11/2024 2:59 PM

## 2024-04-12 LAB — GC/CHLAMYDIA PROBE AMP (~~LOC~~) NOT AT ARMC
Chlamydia: NEGATIVE
Comment: NEGATIVE
Comment: NORMAL
Neisseria Gonorrhea: NEGATIVE

## 2024-04-13 LAB — CULTURE, OB URINE

## 2024-04-22 ENCOUNTER — Ambulatory Visit

## 2024-04-22 ENCOUNTER — Other Ambulatory Visit: Payer: Self-pay | Admitting: *Deleted

## 2024-04-22 ENCOUNTER — Ambulatory Visit: Attending: Obstetrics and Gynecology | Admitting: Obstetrics

## 2024-04-22 VITALS — BP 127/53 | HR 83

## 2024-04-22 DIAGNOSIS — Z3480 Encounter for supervision of other normal pregnancy, unspecified trimester: Secondary | ICD-10-CM | POA: Diagnosis not present

## 2024-04-22 DIAGNOSIS — E669 Obesity, unspecified: Secondary | ICD-10-CM

## 2024-04-22 DIAGNOSIS — Z3689 Encounter for other specified antenatal screening: Secondary | ICD-10-CM

## 2024-04-22 DIAGNOSIS — R109 Unspecified abdominal pain: Secondary | ICD-10-CM | POA: Insufficient documentation

## 2024-04-22 DIAGNOSIS — O99212 Obesity complicating pregnancy, second trimester: Secondary | ICD-10-CM | POA: Diagnosis present

## 2024-04-22 DIAGNOSIS — O26892 Other specified pregnancy related conditions, second trimester: Secondary | ICD-10-CM | POA: Insufficient documentation

## 2024-04-22 DIAGNOSIS — O99210 Obesity complicating pregnancy, unspecified trimester: Secondary | ICD-10-CM

## 2024-04-22 DIAGNOSIS — Z363 Encounter for antenatal screening for malformations: Secondary | ICD-10-CM | POA: Diagnosis not present

## 2024-04-22 DIAGNOSIS — Z3A20 20 weeks gestation of pregnancy: Secondary | ICD-10-CM | POA: Insufficient documentation

## 2024-04-22 NOTE — Progress Notes (Signed)
 MFM Consult Note  Lauren Costa is currently at 20 weeks and 2 days.  She was seen due to maternal obesity with a BMI of 40.  She denies any significant past medical history and denies any problems in her current pregnancy.    She had a cell free DNA test earlier in her pregnancy which indicated a low risk for trisomy 12, 61, and 13. A female fetus is predicted.  The FOB reports that he was born with 6 fingers on each hand.  Sonographic findings Single intrauterine pregnancy at 20w 2d  Fetal cardiac activity:  Observed and appears normal. Presentation: Breech. The anatomic structures that were well seen appear normal without evidence of soft markers. Due to poor acoustic windows some structures remain suboptimally visualized. Fetal biometry shows the estimated fetal weight at the 58 percentile.  Amniotic fluid: Within normal limits.  MVP: 6.02 cm. Placenta: Anterior. Adnexa: No abnormality visualized.. Cervical length: 3 cm.  The patient was advised that I did not visualize an extra digit in the fetal hands today.  However, the views of the fetal anatomy were limited today due to maternal body habitus and the fetal position.  The patient was informed that anomalies may be missed due to technical limitations. If the fetus is in a suboptimal position or maternal habitus is increased, visualization of the fetus in the maternal uterus may be impaired.  Due to maternal obesity, we will continue to follow her with growth ultrasounds throughout her pregnancy.    Weekly fetal testing will be started at around 34 weeks.  She will return in 4 weeks for another growth scan and to complete the views of the fetal anatomy.    The patient stated that all of her questions were answered today.  A total of 30 minutes was spent counseling and coordinating the care for this patient.  Greater than 50% of the time was spent in direct face-to-face contact.

## 2024-05-02 ENCOUNTER — Ambulatory Visit (INDEPENDENT_AMBULATORY_CARE_PROVIDER_SITE_OTHER): Admitting: Obstetrics and Gynecology

## 2024-05-02 ENCOUNTER — Encounter: Payer: Self-pay | Admitting: Obstetrics and Gynecology

## 2024-05-02 VITALS — BP 118/75 | HR 95 | Wt 233.0 lb

## 2024-05-02 DIAGNOSIS — Z3A21 21 weeks gestation of pregnancy: Secondary | ICD-10-CM

## 2024-05-02 DIAGNOSIS — Z3480 Encounter for supervision of other normal pregnancy, unspecified trimester: Secondary | ICD-10-CM

## 2024-05-02 DIAGNOSIS — O99212 Obesity complicating pregnancy, second trimester: Secondary | ICD-10-CM | POA: Diagnosis not present

## 2024-05-02 DIAGNOSIS — O99210 Obesity complicating pregnancy, unspecified trimester: Secondary | ICD-10-CM

## 2024-05-02 NOTE — Progress Notes (Signed)
   PRENATAL VISIT NOTE  Subjective:  Lauren Costa is a 28 y.o. G5P1031 at [redacted]w[redacted]d being seen today for ongoing prenatal care.  She is currently monitored for the following issues for this high-risk pregnancy and has Bilateral breast lump; Mild tetrahydrocannabinol (THC) abuse; Supervision of other normal pregnancy, antepartum; and Obesity in pregnancy on their problem list.  Patient reports no complaints.  Contractions: Not present. Vag. Bleeding: None.  Movement: Present. Denies leaking of fluid.   The following portions of the patient's history were reviewed and updated as appropriate: allergies, current medications, past family history, past medical history, past social history, past surgical history and problem list.   Objective:    Vitals:   05/02/24 0858  BP: 118/75  Pulse: 95  Weight: 233 lb (105.7 kg)    Fetal Status:  Fetal Heart Rate (bpm): 132   Movement: Present    General: Alert, oriented and cooperative. Patient is in no acute distress.  Skin: Skin is warm and dry. No rash noted.   Cardiovascular: Normal heart rate noted  Respiratory: Normal respiratory effort, no problems with respiration noted  Abdomen: Soft, gravid, appropriate for gestational age.  Pain/Pressure: Absent     Pelvic: Cervical exam deferred        Extremities: Normal range of motion.  Edema: None  Mental Status: Normal mood and affect. Normal behavior. Normal judgment and thought content.   Assessment and Plan:  Pregnancy: G5P1031 at [redacted]w[redacted]d 1. Supervision of other normal pregnancy, antepartum (Primary) Patient is doing well without complaints Third trimester labs and glucola next visit  2. Obesity in pregnancy Follow up growth ultrasound  Preterm labor symptoms and general obstetric precautions including but not limited to vaginal bleeding, contractions, leaking of fluid and fetal movement were reviewed in detail with the patient. Please refer to After Visit Summary for other counseling  recommendations.   Return in about 4 weeks (around 05/30/2024) for in person, ROB, High risk, 2 hr glucola next visit.  Future Appointments  Date Time Provider Department Center  05/20/2024  9:15 AM [facility] PROVID[facility] 1 [facility]  05/20/2024  9:30 AM [facility] US5 [facility]    Winton Felt, MD

## 2024-05-03 ENCOUNTER — Telehealth: Payer: Self-pay

## 2024-05-20 ENCOUNTER — Ambulatory Visit

## 2024-05-24 ENCOUNTER — Other Ambulatory Visit: Payer: Self-pay | Admitting: *Deleted

## 2024-05-24 ENCOUNTER — Ambulatory Visit: Attending: Obstetrics | Admitting: Obstetrics and Gynecology

## 2024-05-24 ENCOUNTER — Ambulatory Visit

## 2024-05-24 VITALS — BP 121/64

## 2024-05-24 DIAGNOSIS — O99212 Obesity complicating pregnancy, second trimester: Secondary | ICD-10-CM | POA: Insufficient documentation

## 2024-05-24 DIAGNOSIS — E669 Obesity, unspecified: Secondary | ICD-10-CM

## 2024-05-24 DIAGNOSIS — Z3A24 24 weeks gestation of pregnancy: Secondary | ICD-10-CM

## 2024-05-24 DIAGNOSIS — E6689 Other obesity not elsewhere classified: Secondary | ICD-10-CM

## 2024-05-24 DIAGNOSIS — O358XX0 Maternal care for other (suspected) fetal abnormality and damage, not applicable or unspecified: Secondary | ICD-10-CM

## 2024-05-24 DIAGNOSIS — E66813 Obesity, class 3: Secondary | ICD-10-CM

## 2024-05-24 DIAGNOSIS — O99210 Obesity complicating pregnancy, unspecified trimester: Secondary | ICD-10-CM

## 2024-05-24 DIAGNOSIS — Z3480 Encounter for supervision of other normal pregnancy, unspecified trimester: Secondary | ICD-10-CM

## 2024-05-24 NOTE — Progress Notes (Signed)
 Maternal-Fetal Medicine Consultation  Name: Lauren Costa  MRN: [id number]  GA: [id number] [redacted]w[redacted]d   Patient is here for fetal growth assessment and completion of fetal anatomy. Pregravid BMI 40.  Ultrasound Normal fetal growth and amniotic fluid.  Fetal anatomical survey appears normal but still limited and could not be completed.   Pregravid BMI  Grade 3 obesity is independently associated with increased risk of stillbirth (2.5- to 3-fold), but the absolute risk is very small.  I discussed protocol of weekly antenatal testing from [redacted] weeks gestation until delivery. Obesity is associated with increased risk for gestational diabetes and gestational hypertension. Ultrasound has limitations in the resolution of ultrasound images and fetal anomalies may be missed.   I explained the components of antenatal testing (biophysical profile) and that if it reassuring, the risk of stillbirth is less than 1 per 1,000 births in one week. We recommend weekly antenatal testing from [redacted] weeks gestation until delivery.   Recommendations - An appointment was made for her to return in 6 weeks and 10 weeks for fetal growth assessments. - Weekly antenatal testing from [redacted] weeks gestation until delivery.     Consultation including face-to-face (more than 50%) counseling 20 minutes.

## 2024-05-26 ENCOUNTER — Telehealth: Payer: Self-pay

## 2024-05-26 NOTE — Telephone Encounter (Signed)
Attempted to return call, no answer, left vm.

## 2024-05-30 ENCOUNTER — Ambulatory Visit (INDEPENDENT_AMBULATORY_CARE_PROVIDER_SITE_OTHER): Admitting: Obstetrics and Gynecology

## 2024-05-30 ENCOUNTER — Other Ambulatory Visit (HOSPITAL_COMMUNITY)
Admission: RE | Admit: 2024-05-30 | Discharge: 2024-05-30 | Disposition: A | Discharge status: Home / Self Care | Source: Ambulatory Visit | Attending: Obstetrics and Gynecology | Admitting: Obstetrics and Gynecology

## 2024-05-30 ENCOUNTER — Encounter: Payer: Self-pay | Admitting: Obstetrics and Gynecology

## 2024-05-30 ENCOUNTER — Other Ambulatory Visit

## 2024-05-30 VITALS — BP 126/72 | HR 83 | Wt 237.2 lb

## 2024-05-30 DIAGNOSIS — O99210 Obesity complicating pregnancy, unspecified trimester: Secondary | ICD-10-CM | POA: Diagnosis not present

## 2024-05-30 DIAGNOSIS — Z3A25 25 weeks gestation of pregnancy: Secondary | ICD-10-CM | POA: Diagnosis not present

## 2024-05-30 DIAGNOSIS — N898 Other specified noninflammatory disorders of vagina: Secondary | ICD-10-CM | POA: Insufficient documentation

## 2024-05-30 DIAGNOSIS — Z3480 Encounter for supervision of other normal pregnancy, unspecified trimester: Secondary | ICD-10-CM

## 2024-05-30 MED ORDER — PRENATAL PLUS VITAMIN/MINERAL 27-1 MG PO TABS: 1.0000 | ORAL_TABLET | Freq: Every day | ORAL | 11 refills | Status: AC

## 2024-05-30 NOTE — Progress Notes (Signed)
 Pt presents for ROB visit. Pt c/o pelvic pressure and watery discharge.  Requesting Rx for PNV

## 2024-05-30 NOTE — Progress Notes (Signed)
   PRENATAL VISIT NOTE  Subjective:  Lauren Costa is a 28 y.o. G5P1031 at [redacted]w[redacted]d being seen today for ongoing prenatal care.  She is currently monitored for the following issues for this low-risk pregnancy and has Bilateral breast lump; Supervision of other normal pregnancy, antepartum; and Obesity in pregnancy on their problem list.  Patient reports milky vaginal discharge occurring once 2-3 weeks prior (following urination) and again on Thursday, 05/26/24 while laying in bed. The first time she noticed small amount on her leg and the second time a small amount on her clothes. This occurred following intercourse, but cannot recall how soon after. No vaginal itching, odor, or bleeding. Denies leaking of fluid today.   The following portions of the patient's history were reviewed and updated as appropriate: allergies, current medications, past family history, past medical history, past social history, past surgical history and problem list.   Objective:   Vitals:   05/30/24 0835  BP: 126/72  Pulse: 83  Weight: 107.6 kg   Fetal Status:  Fetal Heart Rate (bpm): 152 Fundal Height: 26 cm Movement: Present    General: Alert, oriented and cooperative. Patient is in no acute distress.  Skin: Skin is warm and dry. No rash noted.   Cardiovascular: Normal heart rate noted  Respiratory: Normal respiratory effort, no problems with respiration noted  Abdomen: Soft, gravid, appropriate for gestational age.  Pain/Pressure: Present     Pelvic: Deferred        Extremities: Normal range of motion.  Edema: None  Mental Status: Normal mood and affect. Normal behavior. Normal judgment and thought content.   Assessment and Plan:  Pregnancy: G5P1031 at [redacted]w[redacted]d 1. Supervision of other normal pregnancy, antepartum (Primary) - Feeling well today, reports vigorous fetal movement throughout the day - Anticipatory guidance re Tdap next visit - Prenatal Vit-Fe Fumarate-FA (PRENATAL PLUS VITAMIN/MINERAL) 27-1 MG  TABS; Take 1 tablet by mouth daily.  Dispense: 30 tablet; Refill: 11  2. [redacted] weeks gestation of pregnancy - Glucose Tolerance, 2 Hours w/1 Hour - HIV antibody (with reflex) - RPR - CBC  3. Obesity in pregnancy - Pregravid BMI 40 - Continue growth US , EFW 42% [redacted]w[redacted]d, next 07/05/24  - MFM recommends antenatal testing at 34 weeks  4. Vaginal discharge - Milky vaginal discharge - Reviewed return precautions for leaking fluid if she notices any ongoing watery discharge - Cervicovaginal ancillary only   Preterm labor symptoms and general obstetric precautions including but not limited to vaginal bleeding, contractions, leaking of fluid and fetal movement were reviewed in detail with the patient. Please refer to After Visit Summary for other counseling recommendations.   Return in about 2 weeks (around 06/13/2024) for OB VISIT (MD or APP).  Future Appointments  Date Time Provider Department Center  06/13/2024 11:15 AM Constant, Winton, MD [facility] None  07/05/2024  9:15 AM [facility] PROVIDER 1 [facility]  07/05/2024  9:30 AM [facility] US5 [facility]  08/02/2024  9:15 AM [facility] PROVIDER 1 [facility]  08/02/2024  9:30 AM [facility] US1 [facility]   Vernell Ruddle, SNM 05/30/2024 10:49 AM

## 2024-05-31 ENCOUNTER — Ambulatory Visit: Payer: Self-pay | Admitting: Obstetrics and Gynecology

## 2024-05-31 DIAGNOSIS — Z3480 Encounter for supervision of other normal pregnancy, unspecified trimester: Secondary | ICD-10-CM

## 2024-05-31 LAB — CERVICOVAGINAL ANCILLARY ONLY
Bacterial Vaginitis (gardnerella): NEGATIVE
Candida Glabrata: NEGATIVE
Candida Vaginitis: NEGATIVE
Comment: NEGATIVE
Comment: NEGATIVE
Comment: NEGATIVE

## 2024-05-31 LAB — GLUCOSE TOLERANCE, 2 HOURS W/ 1HR
Glucose, 1 hour: 102 mg/dL (ref 70–179)
Glucose, 2 hour: 90 mg/dL (ref 70–152)
Glucose, Fasting: 83 mg/dL (ref 70–91)

## 2024-06-13 ENCOUNTER — Ambulatory Visit (INDEPENDENT_AMBULATORY_CARE_PROVIDER_SITE_OTHER): Admitting: Obstetrics and Gynecology

## 2024-06-13 ENCOUNTER — Encounter: Payer: Self-pay | Admitting: Obstetrics and Gynecology

## 2024-06-13 VITALS — BP 128/78 | HR 87 | Wt 237.3 lb

## 2024-06-13 DIAGNOSIS — O99210 Obesity complicating pregnancy, unspecified trimester: Secondary | ICD-10-CM

## 2024-06-13 DIAGNOSIS — Z3A27 27 weeks gestation of pregnancy: Secondary | ICD-10-CM | POA: Diagnosis not present

## 2024-06-13 DIAGNOSIS — Z3480 Encounter for supervision of other normal pregnancy, unspecified trimester: Secondary | ICD-10-CM

## 2024-06-13 NOTE — Progress Notes (Signed)
 ROB.  Pt states she feels like my heart has been fluttering. Been occurring for one week.   Pt denies headaches, dizziness, or vision changes.

## 2024-06-13 NOTE — Progress Notes (Signed)
 PRENATAL VISIT NOTE  Subjective:  Lauren Costa is a 28 y.o. G5P1031 at [redacted]w[redacted]d being seen today for ongoing prenatal care.  She is currently monitored for the following issues for this low-risk pregnancy and has Bilateral breast lump; Supervision of other normal pregnancy, antepartum; and Obesity in pregnancy on their problem list.  Patient reports no complaints.  Contractions: Irritability. Vag. Bleeding: None.  Movement: Increased. Denies leaking of fluid.   The following portions of the patient's history were reviewed and updated as appropriate: allergies, current medications, past family history, past medical history, past social history, past surgical history and problem list.   Objective:   Vitals:   06/13/24 1118  BP: 128/78  Pulse: 87  Weight: 237 lb 4.8 oz (107.6 kg)    Fetal Status:  Fetal Heart Rate (bpm): 130   Movement: Increased    General: Alert, oriented and cooperative. Patient is in no acute distress.  Skin: Skin is warm and dry. No rash noted.   Cardiovascular: Normal heart rate noted  Respiratory: Normal respiratory effort, no problems with respiration noted  Abdomen: Soft, gravid, appropriate for gestational age.  Pain/Pressure: Absent     Pelvic: Cervical exam deferred        Extremities: Normal range of motion.  Edema: None  Mental Status: Normal mood and affect. Normal behavior. Normal judgment and thought content.      06/13/2024   11:22 AM 02/25/2024   10:13 AM 01/28/2024    8:47 AM  Depression screen PHQ 2/9  Decreased Interest 1 2   Down, Depressed, Hopeless 0 0 0  PHQ - 2 Score 1 2 0  Altered sleeping 1 2 0  Tired, decreased energy 1 2 0  Change in appetite 1 2 0  Feeling bad or failure about yourself  0 0 0  Trouble concentrating 0 0 0  Moving slowly or fidgety/restless 0 0 0  Suicidal thoughts 0 0 0  PHQ-9 Score 4 8 0        06/13/2024   11:24 AM 02/25/2024   10:13 AM 01/28/2024    8:48 AM 12/17/2022    4:11 PM  GAD 7 : Generalized  Anxiety Score  Nervous, Anxious, on Edge 1  0 3  Control/stop worrying 0 1 0 3  Worry too much - different things 0 1 0 3  Trouble relaxing 0 0 0 1  Restless 0 0 0 1  Easily annoyed or irritable 1 1 1 2   Afraid - awful might happen 0 0 0 2  Total GAD 7 Score 2  1 15     Assessment and Plan:  Pregnancy: G5P1031 at [redacted]w[redacted]d 1. Supervision of other normal pregnancy, antepartum (Primary) Patient is doing well without complaints Plans Nexplanon for contraception Patient reports occasional hear palpitation that is transient. Encouraged increase water  intake and monitoring of symptoms.  2. Obesity in pregnancy Follow up growth ultrasound on 11/25  Preterm labor symptoms and general obstetric precautions including but not limited to vaginal bleeding, contractions, leaking of fluid and fetal movement were reviewed in detail with the patient. Please refer to After Visit Summary for other counseling recommendations.   Return in about 2 weeks (around 06/27/2024) for in person, ROB, Low risk.  Future Appointments  Date Time Provider Department Center  07/05/2024  9:15 AM [facility] PROVIDER 1 [facility]  07/05/2024  9:30 AM [facility] US5 [facility]  08/02/2024  9:15 AM [facility] PROVIDER 1 [facility]  08/02/2024  9:30 AM [facility] US1 [facility]  [facility]    Winton Felt, MD

## 2024-06-14 LAB — CBC
Hematocrit: 32 % — ABNORMAL LOW (ref 34.0–46.6)
Hemoglobin: 10.5 g/dL — ABNORMAL LOW (ref 11.1–15.9)
MCH: 27.3 pg (ref 26.6–33.0)
MCHC: 32.8 g/dL (ref 31.5–35.7)
MCV: 83 fL (ref 79–97)
Platelets: 214 x10E3/uL (ref 150–450)
RBC: 3.85 x10E6/uL (ref 3.77–5.28)
RDW: 12.8 % (ref 11.7–15.4)
WBC: 6.5 x10E3/uL (ref 3.4–10.8)

## 2024-06-14 LAB — RPR: RPR Ser Ql: NONREACTIVE

## 2024-06-14 LAB — HIV ANTIBODY (ROUTINE TESTING W REFLEX): HIV Screen 4th Generation wRfx: NONREACTIVE

## 2024-06-27 ENCOUNTER — Encounter: Payer: Self-pay | Admitting: Obstetrics

## 2024-06-27 ENCOUNTER — Ambulatory Visit (INDEPENDENT_AMBULATORY_CARE_PROVIDER_SITE_OTHER): Admitting: Obstetrics

## 2024-06-27 VITALS — BP 128/46 | HR 90 | Wt 236.0 lb

## 2024-06-27 DIAGNOSIS — J301 Allergic rhinitis due to pollen: Secondary | ICD-10-CM | POA: Diagnosis not present

## 2024-06-27 DIAGNOSIS — Z3480 Encounter for supervision of other normal pregnancy, unspecified trimester: Secondary | ICD-10-CM

## 2024-06-27 DIAGNOSIS — O99213 Obesity complicating pregnancy, third trimester: Secondary | ICD-10-CM

## 2024-06-27 DIAGNOSIS — Z3A29 29 weeks gestation of pregnancy: Secondary | ICD-10-CM

## 2024-06-27 DIAGNOSIS — O99210 Obesity complicating pregnancy, unspecified trimester: Secondary | ICD-10-CM

## 2024-06-27 MED ORDER — LORATADINE 10 MG PO TABS: 10.0000 mg | ORAL_TABLET | Freq: Every day | ORAL | 11 refills | Status: DC

## 2024-06-27 NOTE — Addendum Note (Signed)
 Addended by: RUDY DUNNINGS A on: 06/27/2024 10:31 AM   Modules accepted: Orders

## 2024-06-27 NOTE — Progress Notes (Signed)
 Pt presents for rob. Pt has no questions or concerns at this time.

## 2024-06-27 NOTE — Progress Notes (Addendum)
 Subjective:  Lauren Costa is a 28 y.o. G5P1031 at [redacted]w[redacted]d being seen today for ongoing prenatal care.  She is currently monitored for the following issues for this low-risk pregnancy and has Bilateral breast lump; Supervision of other normal pregnancy, antepartum; and Obesity in pregnancy on their problem list.  Patient reports heartburn and allergies.  Contractions: Irritability. Vag. Bleeding: None.  Movement: Present. Denies leaking of fluid.   The following portions of the patient's history were reviewed and updated as appropriate: allergies, current medications, past family history, past medical history, past social history, past surgical history and problem list. Problem list updated.  Objective:   Vitals:   06/27/24 0842  BP: (!) 128/46  Pulse: 90  Weight: 236 lb (107 kg)    Fetal Status: Fetal Heart Rate (bpm): 131   Movement: Present     General:  Alert, oriented and cooperative. Patient is in no acute distress.  Skin: Skin is warm and dry. No rash noted.   Cardiovascular: Normal heart rate noted  Respiratory: Normal respiratory effort, no problems with respiration noted  Abdomen: Soft, gravid, appropriate for gestational age. Pain/Pressure: Present     Pelvic:  Cervical exam deferred        Extremities: Normal range of motion.  Edema: None  Mental Status: Normal mood and affect. Normal behavior. Normal judgment and thought content.   Urinalysis:      Assessment and Plan:  Pregnancy: G5P1031 at [redacted]w[redacted]d  1. Supervision of other normal pregnancy, antepartum (Primary)  2. Obesity in pregnancy  3. Seasonal allergic rhinitis due to pollen Rx: - loratadine (CLARITIN) 10 MG tablet; Take 1 tablet (10 mg total) by mouth daily.  Dispense: 30 tablet; Refill: 11    Preterm labor symptoms and general obstetric precautions including but not limited to vaginal bleeding, contractions, leaking of fluid and fetal movement were reviewed in detail with the patient. Please refer to After  Visit Summary for other counseling recommendations.   Return in about 2 weeks (around 07/11/2024) for ROB.   Rudy Carlin LABOR, MD 06/27/2024

## 2024-07-05 ENCOUNTER — Other Ambulatory Visit: Payer: Self-pay | Admitting: *Deleted

## 2024-07-05 ENCOUNTER — Ambulatory Visit

## 2024-07-05 ENCOUNTER — Ambulatory Visit: Attending: Obstetrics and Gynecology | Admitting: Obstetrics

## 2024-07-05 DIAGNOSIS — E669 Obesity, unspecified: Secondary | ICD-10-CM

## 2024-07-05 DIAGNOSIS — Z3A30 30 weeks gestation of pregnancy: Secondary | ICD-10-CM | POA: Diagnosis not present

## 2024-07-05 DIAGNOSIS — Z362 Encounter for other antenatal screening follow-up: Secondary | ICD-10-CM | POA: Diagnosis not present

## 2024-07-05 DIAGNOSIS — O99213 Obesity complicating pregnancy, third trimester: Secondary | ICD-10-CM | POA: Diagnosis not present

## 2024-07-05 DIAGNOSIS — Z3480 Encounter for supervision of other normal pregnancy, unspecified trimester: Secondary | ICD-10-CM

## 2024-07-05 DIAGNOSIS — O358XX0 Maternal care for other (suspected) fetal abnormality and damage, not applicable or unspecified: Secondary | ICD-10-CM

## 2024-07-05 DIAGNOSIS — O99212 Obesity complicating pregnancy, second trimester: Secondary | ICD-10-CM

## 2024-07-05 NOTE — Progress Notes (Signed)
 MFM Consult Note  Lauren Costa is currently at [redacted]w[redacted]d. She has been followed due to maternal obesity with a BMI of 40.    She denies any problems since her last exam and has screened negative for gestational diabetes..  Sonographic findings Single intrauterine pregnancy at 30w 6d.  Fetal cardiac activity:  Observed and appears normal. Presentation: Cephalic. Fetal biometry shows the estimated fetal weight of 4 lb,  1828g (68%). Amniotic fluid volume: Within normal limits. AFI: 18.76cm.  MVP: 7.48 cm. Placenta: Anterior.  Due to maternal obesity, we will continue to follow her with growth ultrasounds throughout her pregnancy.    Weekly fetal testing should be started at around 34 weeks and continued until delivery.    Delivery should occur at around 39 weeks.    She will return in 4 weeks for a BPP and growth scan.  The patient stated that all of her questions were answered.   A total of 20 minutes was spent counseling and coordinating the care for this patient.  Greater than 50% of the time was spent in direct face-to-face contact.

## 2024-07-13 ENCOUNTER — Other Ambulatory Visit (HOSPITAL_COMMUNITY)
Admission: RE | Admit: 2024-07-13 | Discharge: 2024-07-13 | Disposition: A | Source: Ambulatory Visit | Attending: Obstetrics | Admitting: Obstetrics

## 2024-07-13 ENCOUNTER — Encounter: Payer: Self-pay | Admitting: Obstetrics

## 2024-07-13 ENCOUNTER — Ambulatory Visit: Admitting: Obstetrics

## 2024-07-13 VITALS — BP 131/84 | HR 84 | Wt 235.1 lb

## 2024-07-13 DIAGNOSIS — Z3A32 32 weeks gestation of pregnancy: Secondary | ICD-10-CM

## 2024-07-13 DIAGNOSIS — J301 Allergic rhinitis due to pollen: Secondary | ICD-10-CM

## 2024-07-13 DIAGNOSIS — Z3480 Encounter for supervision of other normal pregnancy, unspecified trimester: Secondary | ICD-10-CM

## 2024-07-13 DIAGNOSIS — O99213 Obesity complicating pregnancy, third trimester: Secondary | ICD-10-CM

## 2024-07-13 DIAGNOSIS — N898 Other specified noninflammatory disorders of vagina: Secondary | ICD-10-CM | POA: Diagnosis present

## 2024-07-13 DIAGNOSIS — O99210 Obesity complicating pregnancy, unspecified trimester: Secondary | ICD-10-CM

## 2024-07-13 MED ORDER — LORATADINE 10 MG PO TABS: 10.0000 mg | ORAL_TABLET | Freq: Every day | ORAL | 11 refills | Status: DC

## 2024-07-13 MED ORDER — TERCONAZOLE 0.8 % VA CREA: 1.0000 | TOPICAL_CREAM | Freq: Every day | VAGINAL | 0 refills | Status: DC

## 2024-07-13 NOTE — Progress Notes (Signed)
 Pt presents for rob. Pt complains of vaginal irritation. No other questions or concerns at this time.

## 2024-07-13 NOTE — Progress Notes (Signed)
 Subjective:  Lauren Costa is a 28 y.o. G5P1031 at [redacted]w[redacted]d being seen today for ongoing prenatal care.  She is currently monitored for the following issues for this low-risk pregnancy and has Bilateral breast lump; Supervision of other normal pregnancy, antepartum; and Obesity in pregnancy on their problem list.  Patient reports vaginal irritation.  Contractions: Irritability. Vag. Bleeding: None.  Movement: Present. Denies leaking of fluid.   The following portions of the patient's history were reviewed and updated as appropriate: allergies, current medications, past family history, past medical history, past social history, past surgical history and problem list. Problem list updated.  Objective:   Vitals:   07/13/24 1010  BP: 131/84  Pulse: 84  Weight: 235 lb 1.6 oz (106.6 kg)    Fetal Status: Fetal Heart Rate (bpm): 131   Movement: Present     General:  Alert, oriented and cooperative. Patient is in no acute distress.  Skin: Skin is warm and dry. No rash noted.   Cardiovascular: Normal heart rate noted  Respiratory: Normal respiratory effort, no problems with respiration noted  Abdomen: Soft, gravid, appropriate for gestational age. Pain/Pressure: Present     Pelvic:  Cervical exam deferred        Extremities: Normal range of motion.  Edema: None  Mental Status: Normal mood and affect. Normal behavior. Normal judgment and thought content.   Urinalysis:      Assessment and Plan:  Pregnancy: G5P1031 at [redacted]w[redacted]d  1. Supervision of other normal pregnancy, antepartum (Primary)  2. Vaginal discharge Rx: - Cervicovaginal ancillary only( [place])  3. Vaginal irritation Rx: - terconazole  (TERAZOL 3 ) 0.8 % vaginal cream; Place 1 applicator vaginally at bedtime.  Dispense: 20 g; Refill: 0  4. Obesity in pregnancy  5. Seasonal allergic rhinitis due to pollen Rx: - loratadine  (CLARITIN ) 10 MG tablet; Take 1 tablet (10 mg total) by mouth daily.  Dispense: 30 tablet; Refill: 11     Preterm labor symptoms and general obstetric precautions including but not limited to vaginal bleeding, contractions, leaking of fluid and fetal movement were reviewed in detail with the patient. Please refer to After Visit Summary for other counseling recommendations.   Return in about 2 weeks (around 07/27/2024) for ROB.   Rudy Carlin LABOR, MD 07/13/2024

## 2024-07-14 LAB — CERVICOVAGINAL ANCILLARY ONLY
Bacterial Vaginitis (gardnerella): NEGATIVE
Candida Glabrata: NEGATIVE
Candida Vaginitis: POSITIVE — AB
Chlamydia: NEGATIVE
Comment: NEGATIVE
Comment: NEGATIVE
Comment: NEGATIVE
Comment: NEGATIVE
Comment: NEGATIVE
Comment: NORMAL
Neisseria Gonorrhea: NEGATIVE
Trichomonas: NEGATIVE

## 2024-07-17 ENCOUNTER — Ambulatory Visit: Payer: Self-pay | Admitting: Obstetrics

## 2024-07-27 ENCOUNTER — Ambulatory Visit: Admitting: Obstetrics & Gynecology

## 2024-07-27 VITALS — BP 128/80 | HR 89 | Wt 236.0 lb

## 2024-07-27 DIAGNOSIS — Z3A34 34 weeks gestation of pregnancy: Secondary | ICD-10-CM

## 2024-07-27 DIAGNOSIS — Z3480 Encounter for supervision of other normal pregnancy, unspecified trimester: Secondary | ICD-10-CM

## 2024-07-27 DIAGNOSIS — O99210 Obesity complicating pregnancy, unspecified trimester: Secondary | ICD-10-CM

## 2024-07-27 DIAGNOSIS — O99213 Obesity complicating pregnancy, third trimester: Secondary | ICD-10-CM | POA: Diagnosis not present

## 2024-07-27 NOTE — Progress Notes (Cosign Needed)
 PRENATAL VISIT NOTE  Subjective:  Lauren Costa is a 28 y.o. G5P1031 at [redacted]w[redacted]d being seen today for ongoing prenatal care.  She is currently monitored for the following issues for this low-risk pregnancy and has Bilateral breast lump; Supervision of other normal pregnancy, antepartum; and Obesity in pregnancy on their problem list.  Patient reports vaginal discharge 12/16 which she believes may be part of her mucus plug, as well as mild contractions/cramping which since resolved.  Contractions: Irritability. Vag. Bleeding: None.  Movement: Present. Denies leaking of fluid.   The following portions of the patient's history were reviewed and updated as appropriate: allergies, current medications, past family history, past medical history, past social history, past surgical history and problem list.   Objective:   Vitals:   07/27/24 0915  BP: 128/80  Pulse: 89  Weight: 236 lb (107 kg)    Fetal Status:  Fetal Heart Rate (bpm): 141   Movement: Present    General: Alert, oriented and cooperative. Patient is in no acute distress.  Skin: Skin is warm and dry. No rash noted.   Cardiovascular: Normal heart rate noted  Respiratory: Normal respiratory effort, no problems with respiration noted  Abdomen: Soft, gravid, appropriate for gestational age.  Pain/Pressure: Present     Pelvic: Cervical exam deferred        Extremities: Normal range of motion.  Edema: None  Mental Status: Normal mood and affect. Normal behavior. Normal judgment and thought content.      06/13/2024   11:22 AM 02/25/2024   10:13 AM 01/28/2024    8:47 AM  Depression screen PHQ 2/9  Decreased Interest 1 2   Down, Depressed, Hopeless 0 0 0  PHQ - 2 Score 1 2 0  Altered sleeping 1 2 0  Tired, decreased energy 1 2 0  Change in appetite 1 2 0  Feeling bad or failure about yourself  0 0 0  Trouble concentrating 0 0 0  Moving slowly or fidgety/restless 0 0 0  Suicidal thoughts 0 0 0  PHQ-9 Score 4  8  0      Data  saved with a previous flowsheet row definition        06/13/2024   11:24 AM 02/25/2024   10:13 AM 01/28/2024    8:48 AM 12/17/2022    4:11 PM  GAD 7 : Generalized Anxiety Score  Nervous, Anxious, on Edge 1  0 3  Control/stop worrying 0 1 0 3  Worry too much - different things 0 1 0 3  Trouble relaxing 0 0 0 1  Restless 0 0 0 1  Easily annoyed or irritable 1 1 1 2   Afraid - awful might happen 0 0 0 2  Total GAD 7 Score 2  1 15     Assessment and Plan:  Pregnancy: G5P1031 at [redacted]w[redacted]d 1. Supervision of other normal pregnancy, antepartum (Primary) Follow up after 2 weeks, plan for GBS testing, consider cervical exam.  U/S and MFM appointment scheduled on 12/23  2. [redacted] weeks gestation of pregnancy   Preterm labor symptoms and general obstetric precautions including but not limited to vaginal bleeding, contractions, leaking of fluid and fetal movement were reviewed in detail with the patient. Please refer to After Visit Summary for other counseling recommendations.   No follow-ups on file.  Future Appointments  Date Time Provider Department Center  08/02/2024  9:15 AM [facility] PROVIDER 1 [facility]  08/02/2024  9:30 AM [facility] US1 [facility]  08/30/2024  8:15  AM [facility] PROVIDER 1 [facility]  08/30/2024  8:30 AM [facility] US2 [facility]    Nunzio DELENA Cena Verma, Medical Student

## 2024-08-02 ENCOUNTER — Ambulatory Visit: Attending: Obstetrics and Gynecology | Admitting: Maternal & Fetal Medicine

## 2024-08-02 ENCOUNTER — Ambulatory Visit

## 2024-08-02 VITALS — BP 128/71

## 2024-08-02 DIAGNOSIS — E669 Obesity, unspecified: Secondary | ICD-10-CM

## 2024-08-02 DIAGNOSIS — O99213 Obesity complicating pregnancy, third trimester: Secondary | ICD-10-CM | POA: Insufficient documentation

## 2024-08-02 DIAGNOSIS — R109 Unspecified abdominal pain: Secondary | ICD-10-CM | POA: Diagnosis not present

## 2024-08-02 DIAGNOSIS — O26893 Other specified pregnancy related conditions, third trimester: Secondary | ICD-10-CM | POA: Diagnosis not present

## 2024-08-02 DIAGNOSIS — Z362 Encounter for other antenatal screening follow-up: Secondary | ICD-10-CM | POA: Diagnosis not present

## 2024-08-02 DIAGNOSIS — Z3480 Encounter for supervision of other normal pregnancy, unspecified trimester: Secondary | ICD-10-CM

## 2024-08-02 DIAGNOSIS — Z3A34 34 weeks gestation of pregnancy: Secondary | ICD-10-CM | POA: Insufficient documentation

## 2024-08-02 DIAGNOSIS — O358XX0 Maternal care for other (suspected) fetal abnormality and damage, not applicable or unspecified: Secondary | ICD-10-CM

## 2024-08-02 DIAGNOSIS — O99212 Obesity complicating pregnancy, second trimester: Secondary | ICD-10-CM

## 2024-08-02 NOTE — Progress Notes (Signed)
" ° °  Patient information  Patient Name: Lauren Costa  Patient MRN:   [id number]  Referring practice: MFM Referring Provider: [facility]  Problem List   Patient Active Problem List   Diagnosis Date Noted   Obesity in pregnancy 02/25/2024   Supervision of other normal pregnancy, antepartum 01/28/2024   Bilateral breast lump 03/19/2016    Maternal Fetal medicine Consult  [place] TOY SAMARIN is a 28 y.o. H4E8968 at [redacted]w[redacted]d here for ultrasound and consultation. Amelda E Minturn is doing well today with no acute concerns. Today we focused on the following:   The patient is here for a growth ultrasound and biophysical profile due to maternal obesity, with a pre-pregnancy BMI of 40. Todays ultrasound demonstrates an estimated fetal weight at the 88th percentile overall. The biophysical profile is reassuring. She reports normal fetal movement. We discussed the importance of consistent fetal movement monitoring and adherence to all scheduled prenatal and antenatal testing visits, particularly given her elevated BMI and the potential implications for fetal growth and delivery planning.  RECOMMENDATIONS -Continue weekly antenatal testing as scheduled. -Return in four weeks for a repeat growth ultrasound to assess final estimated fetal weight prior to delivery. -Continue daily fetal movement awareness and present for evaluation with any concerns. -Maintain adherence to all prenatal visits and recommended surveillance.  The patient had time to ask questions that were answered to her satisfaction.  She verbalized understanding and agrees to proceed with the plan above.   I spent 30 minutes reviewing the patients chart, including labs and images as well as counseling the patient about her medical conditions. Greater than 50% of the time was spent in direct face-to-face patient counseling.  [name]  MFM, [place]   08/02/2024  2:48 PM   Review of Systems: A review of systems was performed  and was negative except per HPI   Vitals and Physical Exam    08/02/2024    9:23 AM 07/27/2024    9:15 AM 07/13/2024   10:10 AM  Vitals with BMI  Weight  236 lbs 235 lbs 2 oz  Systolic 128 128 868  Diastolic 71 80 84  Pulse  89 84    Sitting comfortably on the sonogram table Nonlabored breathing Normal rate and rhythm Abdomen is nontender  Past pregnancies OB History  Gravida Para Term Preterm AB Living  5 1 1  0 3 1  SAB IAB Ectopic Multiple Live Births  0 3  0 1    # Outcome Date GA Lbr Len/2nd Weight Sex Type Anes PTL Lv  5 Current           4 IAB 03/25/21     TAB     3 IAB 2020          2 IAB 05/2018          1 Term 10/21/16 [redacted]w[redacted]d 28:00 / 00:28 7 lb 7.6 oz (3.391 kg) F Vag-Spont EPI  LIV     Future Appointments  Date Time Provider Department Center  08/10/2024  8:55 AM Davis, Devon E, PA-C [facility] None  08/17/2024 10:15 AM Delores Nidia CROME, FNP [facility] None  08/25/2024 10:15 AM Davis, Devon E, PA-C [facility] None  08/30/2024  8:15 AM [facility] PROVIDER 1 [facility]  08/30/2024  8:30 AM [facility] US2 [facility]      "

## 2024-08-10 ENCOUNTER — Ambulatory Visit: Payer: Self-pay | Admitting: Physician Assistant

## 2024-08-10 ENCOUNTER — Other Ambulatory Visit (HOSPITAL_COMMUNITY)
Admission: RE | Admit: 2024-08-10 | Discharge: 2024-08-10 | Disposition: A | Discharge status: Home / Self Care | Source: Ambulatory Visit

## 2024-08-10 ENCOUNTER — Encounter: Payer: Self-pay | Admitting: Physician Assistant

## 2024-08-10 VITALS — BP 136/79 | HR 82 | Wt 238.4 lb

## 2024-08-10 DIAGNOSIS — Z6839 Body mass index (BMI) 39.0-39.9, adult: Secondary | ICD-10-CM

## 2024-08-10 DIAGNOSIS — Z3480 Encounter for supervision of other normal pregnancy, unspecified trimester: Secondary | ICD-10-CM | POA: Diagnosis present

## 2024-08-10 DIAGNOSIS — Z3A36 36 weeks gestation of pregnancy: Secondary | ICD-10-CM | POA: Diagnosis not present

## 2024-08-10 DIAGNOSIS — Z3483 Encounter for supervision of other normal pregnancy, third trimester: Secondary | ICD-10-CM

## 2024-08-10 NOTE — Progress Notes (Signed)
 "  PRENATAL VISIT NOTE  Subjective:  Lauren Costa is a 28 y.o. G5P1031 at [redacted]w[redacted]d being seen today for ongoing prenatal care.  She is currently monitored for the following issues for this low-risk pregnancy and has Bilateral breast lump; Supervision of other normal pregnancy, antepartum; and Obesity in pregnancy on their problem list.  Patient reports no complaints.  Contractions: Irritability. Vag. Bleeding: None.  Movement: Present. Denies leaking of fluid.   The following portions of the patient's history were reviewed and updated as appropriate: allergies, current medications, past family history, past medical history, past social history, past surgical history and problem list.   Objective:   Vitals:   08/10/24 0915  BP: 136/79  Pulse: 82  Weight: 238 lb 6.4 oz (108.1 kg)    Fetal Status:  Fetal Heart Rate (bpm): 142 Fundal Height: 36 cm Movement: Present    General: Alert, oriented and cooperative. Patient is in no acute distress.  Skin: Skin is warm and dry. No rash noted.   Cardiovascular: Normal heart rate noted  Respiratory: Normal respiratory effort, no problems with respiration noted  Abdomen: Soft, gravid, appropriate for gestational age.  Pain/Pressure: Present     Pelvic: Cervical exam deferred        Extremities: Normal range of motion.  Edema: None  Mental Status: Normal mood and affect. Normal behavior. Normal judgment and thought content.      08/10/2024    9:18 AM 06/13/2024   11:22 AM 02/25/2024   10:13 AM  Depression screen PHQ 2/9  Decreased Interest 1 1 2   Down, Depressed, Hopeless 1 0 0  PHQ - 2 Score 2 1 2   Altered sleeping 0 1 2  Tired, decreased energy 1 1 2   Change in appetite 0 1 2  Feeling bad or failure about yourself  0 0 0  Trouble concentrating 0 0 0  Moving slowly or fidgety/restless 0 0 0  Suicidal thoughts 0 0 0  PHQ-9 Score 3 4  8       Data saved with a previous flowsheet row definition        08/10/2024    9:19 AM 06/13/2024    11:24 AM 02/25/2024   10:13 AM 01/28/2024    8:48 AM  GAD 7 : Generalized Anxiety Score  Nervous, Anxious, on Edge 2 1  0  Control/stop worrying 0 0 1 0  Worry too much - different things 1 0 1 0  Trouble relaxing 0 0 0 0  Restless 0 0 0 0  Easily annoyed or irritable 1 1 1 1   Afraid - awful might happen 0 0 0 0  Total GAD 7 Score 4 2  1     Assessment and Plan:  Pregnancy: G5P1031 at [redacted]w[redacted]d  1. Supervision of other normal pregnancy, antepartum (Primary) Patient doing well, feeling regular fetal movement BP, FHR, FH appropriate   2. [redacted] weeks gestation of pregnancy Anticipatory guidance about next visits/weeks of pregnancy given.  36 week swabs today  3. BMI 39.0-39.9,adult Continue ASA NST today- Category I Continue weekly antenatal testing   Preterm labor symptoms and general obstetric precautions including but not limited to vaginal bleeding, contractions, leaking of fluid and fetal movement were reviewed in detail with the patient.  Please refer to After Visit Summary for other counseling recommendations.   No follow-ups on file.  Future Appointments  Date Time Provider Department Center  08/17/2024 10:15 AM Delores Nidia CROME, FNP [facility] None  08/25/2024 10:15 AM Britton Bera E,  PA-C [facility] None  08/30/2024  8:15 AM [facility] PROVIDER 1 [facility]  08/30/2024  8:30 AM [facility] US2 [facility]    Jorene FORBES Moats, PA-C  "

## 2024-08-10 NOTE — Progress Notes (Signed)
 Pt presents for ROB visit. No concerns

## 2024-08-11 NOTE — L&D Delivery Note (Cosign Needed)
 OB/GYN Faculty Practice Delivery Note  Lauren Costa is a 29 y.o. H4E8968 s/p SVD at [redacted]w[redacted]d. She was admitted for SROM.   ROM: 11h 100m with meconium-stained fluid GBS Status: negative Maximum Maternal Temperature: 98.1F  Labor Progress: Initial SVE 2/50/-3, progressed expectantly to fully dilated/+2  Delivery Date/Time: 09/08/24 9167 Delivery: Called to room and patient was complete and pushing. Head delivered ROA. Tight nuchal cord x1, reduced while pushing. Shoulder and body delivered in usual fashion. Infant with spontaneous cry, placed on mother's abdomen, dried and stimulated. Cord clamped x 2 after 1-minute delay, and cut by father of baby. Cord blood drawn. Placenta delivered spontaneously, intact, with 3-vessel cord. Fundus firm with massage and Pitocin . Labia, perineum, vagina, and cervix inspected, L periurethral laceration found and repaired with 3-0 Vicryl.   Placenta: Intact, delivered spontaneously Complications: None Lacerations: L periurethral, repaired EBL: Analgesia: Epidural  Infant: Viable female  APGARs 9,9  3450g  [name], MD 09/08/2024, 9:08 AM

## 2024-08-12 LAB — CERVICOVAGINAL ANCILLARY ONLY
Chlamydia: POSITIVE — AB
Comment: NEGATIVE
Comment: NEGATIVE
Comment: NORMAL
Neisseria Gonorrhea: NEGATIVE
Trichomonas: NEGATIVE

## 2024-08-14 LAB — CULTURE, BETA STREP (GROUP B ONLY): Strep Gp B Culture: NEGATIVE

## 2024-08-15 ENCOUNTER — Telehealth: Payer: Self-pay | Admitting: Physician Assistant

## 2024-08-15 MED ORDER — AZITHROMYCIN 500 MG PO TABS: 1000.0000 mg | ORAL_TABLET | Freq: Once | ORAL | 0 refills | Status: AC

## 2024-08-15 NOTE — Telephone Encounter (Signed)
 Telephone call to patient regarding +chlamydia.  Patient had already seen her results.  Rx routed to pharmacy per protocol.  Instructed patient to notify her partner for treatment and to abstain from intercourse until 7-10 days follow treatment by all.

## 2024-08-16 ENCOUNTER — Encounter: Payer: Self-pay | Admitting: *Deleted

## 2024-08-17 ENCOUNTER — Ambulatory Visit: Payer: Self-pay | Admitting: Obstetrics and Gynecology

## 2024-08-17 ENCOUNTER — Encounter: Payer: Self-pay | Admitting: Obstetrics and Gynecology

## 2024-08-17 VITALS — BP 114/71 | HR 92 | Wt 237.0 lb

## 2024-08-17 DIAGNOSIS — A749 Chlamydial infection, unspecified: Secondary | ICD-10-CM

## 2024-08-17 DIAGNOSIS — O99213 Obesity complicating pregnancy, third trimester: Secondary | ICD-10-CM | POA: Diagnosis not present

## 2024-08-17 DIAGNOSIS — Z3480 Encounter for supervision of other normal pregnancy, unspecified trimester: Secondary | ICD-10-CM

## 2024-08-17 DIAGNOSIS — O98813 Other maternal infectious and parasitic diseases complicating pregnancy, third trimester: Secondary | ICD-10-CM | POA: Diagnosis not present

## 2024-08-17 DIAGNOSIS — Z3A37 37 weeks gestation of pregnancy: Secondary | ICD-10-CM

## 2024-08-17 DIAGNOSIS — O98819 Other maternal infectious and parasitic diseases complicating pregnancy, unspecified trimester: Secondary | ICD-10-CM | POA: Insufficient documentation

## 2024-08-17 DIAGNOSIS — Z6839 Body mass index (BMI) 39.0-39.9, adult: Secondary | ICD-10-CM

## 2024-08-17 NOTE — Progress Notes (Signed)
" ° °  PRENATAL VISIT NOTE  Subjective:  Lauren Costa is a 29 y.o. G5P1031 at [redacted]w[redacted]d being seen today for ongoing prenatal care.  She is currently monitored for the following issues for this low-risk pregnancy and has Bilateral breast lump; Supervision of other normal pregnancy, antepartum; Obesity in pregnancy; and Chlamydia infection affecting pregnancy on their problem list.  Patient reports had an episode the other day of leaking or d/c but did not continue.  Contractions: Not present. Vag. Bleeding: None.  Movement: Present. Denies leaking of fluid.   The following portions of the patient's history were reviewed and updated as appropriate: allergies, current medications, past family history, past medical history, past social history, past surgical history and problem list.   Objective:   Vitals:   08/17/24 1022  BP: 114/71  Pulse: 92  Weight: 237 lb (107.5 kg)    Fetal Status:  Fetal Heart Rate (bpm): 128   Movement: Present    General: Alert, oriented and cooperative. Patient is in no acute distress.  Skin: Skin is warm and dry. No rash noted.   Cardiovascular: Normal heart rate noted  Respiratory: Normal respiratory effort, no problems with respiration noted  Abdomen: Soft, gravid, appropriate for gestational age.  Pain/Pressure: Present     Pelvic: Cervical exam deferred        Extremities: Normal range of motion.  Edema: None  Mental Status: Normal mood and affect. Normal behavior. Normal judgment and thought content.     Assessment and Plan:  Pregnancy: G5P1031 at [redacted]w[redacted]d 1. Supervision of other normal pregnancy, antepartum (Primary) BP and FHR normal   2. BMI 39.0-39.9,adult NST today reactive Normal growth 12/23 Follow up growth 1/20  3. [redacted] weeks gestation of pregnancy Labor precautions discussed   4. Chlamydia infection affecting pregnancy in third trimester Completed tx, will do TOC later date  Term labor symptoms and general obstetric precautions including  but not limited to vaginal bleeding, contractions, leaking of fluid and fetal movement were reviewed in detail with the patient. Please refer to After Visit Summary for other counseling recommendations.   Return in 1 week for OB visit   Future Appointments  Date Time Provider Department Center  08/25/2024 10:15 AM Davis, Devon E, PA-C [facility] None  08/30/2024  8:15 AM [facility] PROVIDER 1 [facility]  08/30/2024  8:30 AM [facility] US2 [facility]    Nidia Daring, FNP  "

## 2024-08-17 NOTE — Progress Notes (Signed)
 Pt presents for ROB visit. C/o cloudy, white discharge.

## 2024-08-23 ENCOUNTER — Ambulatory Visit: Payer: Self-pay | Admitting: Physician Assistant

## 2024-08-24 ENCOUNTER — Telehealth: Payer: Self-pay

## 2024-08-24 NOTE — Telephone Encounter (Signed)
 Returned call, pt reports that lately she has been feeling increased pressure and wants to know if she can start her maternity leave. Pt has an appt tomorrow, advised to discuss concerns with provider at appt, pt agreed.

## 2024-08-25 ENCOUNTER — Encounter: Payer: Self-pay | Admitting: Physician Assistant

## 2024-08-25 VITALS — BP 130/76 | HR 81 | Wt 235.9 lb

## 2024-08-25 DIAGNOSIS — Z3A38 38 weeks gestation of pregnancy: Secondary | ICD-10-CM

## 2024-08-25 DIAGNOSIS — O98813 Other maternal infectious and parasitic diseases complicating pregnancy, third trimester: Secondary | ICD-10-CM

## 2024-08-25 DIAGNOSIS — A749 Chlamydial infection, unspecified: Secondary | ICD-10-CM

## 2024-08-25 DIAGNOSIS — O99213 Obesity complicating pregnancy, third trimester: Secondary | ICD-10-CM

## 2024-08-25 DIAGNOSIS — Z6839 Body mass index (BMI) 39.0-39.9, adult: Secondary | ICD-10-CM

## 2024-08-25 DIAGNOSIS — Z3480 Encounter for supervision of other normal pregnancy, unspecified trimester: Secondary | ICD-10-CM

## 2024-08-25 NOTE — Progress Notes (Addendum)
 "  PRENATAL VISIT NOTE  Subjective:  Lauren Costa is a 29 y.o. G5P1031 at [redacted]w[redacted]d being seen today for ongoing prenatal care.  She is currently monitored for the following issues for this low-risk pregnancy and has Bilateral breast lump; Supervision of other normal pregnancy, antepartum; Obesity in pregnancy; and Chlamydia infection affecting pregnancy on their problem list.  Patient reports stressed and overwhelmed from work. 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 She is requesting to go on maternity leave to day due to these issues.    Contractions: Irritability. Vag. Bleeding: None.  Movement: Present. Denies leaking of fluid.   The following portions of the patient's history were reviewed and updated as appropriate: allergies, current medications, past family history, past medical history, past social history, past surgical history and problem list.   Objective:   Vitals:   08/25/24 1054  BP: 130/76  Pulse: 81  Weight: 235 lb 14.4 oz (107 kg)    Fetal Status:  Fetal Heart Rate (bpm): 148   Movement: Present    General: Alert, oriented and cooperative. Patient is in no acute distress.  Skin: Skin is warm and dry. No rash noted.   Cardiovascular: Normal heart rate noted  Respiratory: Normal respiratory effort, no problems with respiration noted  Abdomen: Soft, gravid, appropriate for gestational age.  Pain/Pressure: Present     Pelvic: Cervical exam deferred        Extremities: Normal range of motion.  Edema: None  Mental Status: Normal mood and affect. Normal behavior. Normal judgment and thought content.      08/10/2024    9:18 AM 06/13/2024   11:22 AM 02/25/2024   10:13 AM  Depression screen PHQ 2/9  Decreased Interest 1 1 2   Down, Depressed, Hopeless 1 0 0  PHQ - 2 Score 2 1 2   Altered sleeping 0 1 2  Tired, decreased energy 1 1 2   Change in appetite 0 1 2  Feeling bad or failure about yourself  0 0 0  Trouble concentrating 0 0 0  Moving slowly or fidgety/restless 0  0 0  Suicidal thoughts 0 0 0  PHQ-9 Score 3 4  8       Data saved with a previous flowsheet row definition        08/10/2024    9:19 AM 06/13/2024   11:24 AM 02/25/2024   10:13 AM 01/28/2024    8:48 AM  GAD 7 : Generalized Anxiety Score  Nervous, Anxious, on Edge 2 1  0  Control/stop worrying 0 0 1 0  Worry too much - different things 1 0 1 0  Trouble relaxing 0 0 0 0  Restless 0 0 0 0  Easily annoyed or irritable 1 1 1 1   Afraid - awful might happen 0 0 0 0  Total GAD 7 Score 4 2  1     Assessment and Plan:  Pregnancy: G5P1031 at [redacted]w[redacted]d  1. Supervision of other normal pregnancy, antepartum (Primary) Patient doing well, feeling regular fetal movement BP, FHR, FH appropriate  Discussed with patient that we don't have a medical indication for her being on leave at this time, to which she stated understanding.   2. [redacted] weeks gestation of pregnancy Anticipatory guidance about next visits/weeks of pregnancy given.   3. Chlamydia infection affecting pregnancy in third trimester TOC next visit  4. BMI 39.0-39.9,adult NST category I today Continue ASA Continue growths and antenatal testing  Delivery at 39-40.6 weeks  Term labor symptoms and general obstetric  precautions including but not limited to vaginal bleeding, contractions, leaking of fluid and fetal movement were reviewed in detail with the patient.  Please refer to After Visit Summary for other counseling recommendations.   Return in about 1 week (around 09/01/2024) for [facility].  Future Appointments  Date Time Provider Department Center  08/30/2024  8:15 AM [facility] PROVIDER 1 [facility]  08/30/2024  8:30 AM [facility] US2 [facility]  09/01/2024  9:35 AM Rudy Carlin LABOR, MD [facility] None    Jorene FORBES Moats, PA-C  "

## 2024-08-30 ENCOUNTER — Ambulatory Visit: Attending: Obstetrics | Admitting: Maternal & Fetal Medicine

## 2024-08-30 ENCOUNTER — Ambulatory Visit

## 2024-08-30 VITALS — BP 132/75 | HR 85

## 2024-08-30 DIAGNOSIS — O99213 Obesity complicating pregnancy, third trimester: Secondary | ICD-10-CM

## 2024-08-30 DIAGNOSIS — O99210 Obesity complicating pregnancy, unspecified trimester: Secondary | ICD-10-CM | POA: Diagnosis not present

## 2024-08-30 DIAGNOSIS — R109 Unspecified abdominal pain: Secondary | ICD-10-CM | POA: Diagnosis not present

## 2024-08-30 DIAGNOSIS — Z3A38 38 weeks gestation of pregnancy: Secondary | ICD-10-CM | POA: Insufficient documentation

## 2024-08-30 DIAGNOSIS — O358XX0 Maternal care for other (suspected) fetal abnormality and damage, not applicable or unspecified: Secondary | ICD-10-CM | POA: Insufficient documentation

## 2024-08-30 DIAGNOSIS — E669 Obesity, unspecified: Secondary | ICD-10-CM | POA: Diagnosis not present

## 2024-08-30 DIAGNOSIS — Z3480 Encounter for supervision of other normal pregnancy, unspecified trimester: Secondary | ICD-10-CM

## 2024-08-30 DIAGNOSIS — O26893 Other specified pregnancy related conditions, third trimester: Secondary | ICD-10-CM | POA: Diagnosis not present

## 2024-08-30 NOTE — Progress Notes (Signed)
 "  Patient information  Patient Name: Lauren Costa  Patient MRN:   [id number]  Referring practice: MFM Referring Provider: [facility]  Problem List   Patient Active Problem List   Diagnosis Date Noted   Chlamydia infection affecting pregnancy 08/17/2024   Obesity in pregnancy 02/25/2024   Supervision of other normal pregnancy, antepartum 01/28/2024   Bilateral breast lump 03/19/2016    Maternal Fetal medicine Consult  Lauren Costa is a 29 y.o. G5P1031 at [redacted]w[redacted]d here for ultrasound and consultation. Lauren Costa is doing well today with no acute concerns. Today we focused on the following:   The patient presents today for evaluation in the setting of maternal obesity, with a pre-pregnancy BMI of 40. Todays ultrasound demonstrates an estimated fetal weight at the 75th percentile overall. The biophysical profile is reassuring at 8/8.  She is currently following with her primary OB provider for weekly antenatal testing with non-stress tests. Given reassuring fetal growth and antenatal surveillance, there is no indication for early delivery at this time.  We discussed that delivery should be planned around her estimated due date, or sooner if clinically indicated based on maternal or fetal status. As long as antenatal testing remains reassuring, expectant management is appropriate. The patient verbalizes understanding and agrees with the plan.  Sonographic findings Single intrauterine pregnancy at 38w 6d. Fetal cardiac activity: Observed. Presentation: Cephalic. Interval fetal anatomy appears normal. Fetal biometry shows the estimated fetal weight of 8 lb 3 oz,  3713g (76%). Amniotic fluid: Within normal limits. AFI: 16.1 cm. Placenta: Anterior. BPP: 8/8.   RECOMMENDATIONS -Continue weekly antenatal testing with the primary OB provider -No indication for early delivery at this time -Plan delivery around the estimated due date unless new indications arise -Continue  routine prenatal care and daily fetal movement monitoring  The patient had time to ask questions that were answered to her satisfaction.  She verbalized understanding and agrees to proceed with the plan above.   I spent 20 minutes reviewing the patients chart, including labs and images as well as counseling the patient about her medical conditions. Greater than 50% of the time was spent in direct face-to-face patient counseling.  [name]  MFM, [place]   08/30/2024  9:09 AM   Review of Systems: A review of systems was performed and was negative except per HPI   Vitals and Physical Exam    08/30/2024    8:22 AM 08/25/2024   10:54 AM 08/17/2024   10:22 AM  Vitals with BMI  Weight  235 lbs 14 oz 237 lbs  Systolic 132 130 885  Diastolic 75 76 71  Pulse 85 81 92    Sitting comfortably on the sonogram table Nonlabored breathing Normal rate and rhythm Abdomen is nontender  Past pregnancies OB History  Gravida Para Term Preterm AB Living  5 1 1  0 3 1  SAB IAB Ectopic Multiple Live Births  0 3  0 1    # Outcome Date GA Lbr Len/2nd Weight Sex Type Anes PTL Lv  5 Current           4 IAB 03/25/21     TAB     3 IAB 2020          2 IAB 05/2018          1 Term 10/21/16 [redacted]w[redacted]d 28:00 / 00:28 7 lb 7.6 oz (3.391 kg) F Vag-Spont EPI  LIV     Future Appointments  Date Time  Provider Department Center  09/01/2024  9:35 AM Rudy Carlin LABOR, MD [facility] None      "

## 2024-09-01 ENCOUNTER — Encounter: Payer: Self-pay | Admitting: Obstetrics

## 2024-09-01 ENCOUNTER — Ambulatory Visit: Payer: Self-pay | Admitting: Obstetrics

## 2024-09-01 VITALS — BP 122/77 | HR 79 | Wt 236.5 lb

## 2024-09-01 DIAGNOSIS — A749 Chlamydial infection, unspecified: Secondary | ICD-10-CM

## 2024-09-01 DIAGNOSIS — Z3480 Encounter for supervision of other normal pregnancy, unspecified trimester: Secondary | ICD-10-CM

## 2024-09-01 DIAGNOSIS — O99213 Obesity complicating pregnancy, third trimester: Secondary | ICD-10-CM

## 2024-09-01 DIAGNOSIS — O98813 Other maternal infectious and parasitic diseases complicating pregnancy, third trimester: Secondary | ICD-10-CM | POA: Diagnosis not present

## 2024-09-01 DIAGNOSIS — O99210 Obesity complicating pregnancy, unspecified trimester: Secondary | ICD-10-CM

## 2024-09-01 DIAGNOSIS — Z3A39 39 weeks gestation of pregnancy: Secondary | ICD-10-CM

## 2024-09-01 NOTE — Progress Notes (Signed)
" ° °  Subjective:  Lauren Costa is a 29 y.o. G5P1031 at [redacted]w[redacted]d being seen today for ongoing prenatal care.  She is currently monitored for the following issues for this low-risk pregnancy and has Bilateral breast lump; Supervision of other normal pregnancy, antepartum; Obesity in pregnancy; and Chlamydia infection affecting pregnancy on their problem list.  Patient reports occasional contractions.  Contractions: Irritability. Vag. Bleeding: None.  Movement: Present. Denies leaking of fluid.   The following portions of the patient's history were reviewed and updated as appropriate: allergies, current medications, past family history, past medical history, past social history, past surgical history and problem list. Problem list updated.  Objective:   Vitals:   09/01/24 1029  BP: 122/77  Pulse: 79  Weight: 236 lb 8 oz (107.3 kg)    Fetal Status: Fetal Heart Rate (bpm): 134   Movement: Present     General:  Alert, oriented and cooperative. Patient is in no acute distress.  Skin: Skin is warm and dry. No rash noted.   Cardiovascular: Normal heart rate noted  Respiratory: Normal respiratory effort, no problems with respiration noted  Abdomen: Soft, gravid, appropriate for gestational age. Pain/Pressure: Present     Pelvic:  Cervical exam deferred        Extremities: Normal range of motion.  Edema: None  Mental Status: Normal mood and affect. Normal behavior. Normal judgment and thought content.   Urinalysis:      Assessment and Plan:  Pregnancy: G5P1031 at [redacted]w[redacted]d  1. Supervision of other normal pregnancy, antepartum (Primary)  2. Chlamydia infection affecting pregnancy, antepartum Rx: - Cervicovaginal ancillary only; Future  3. Obesity in pregnancy    Term labor symptoms and general obstetric precautions including but not limited to vaginal bleeding, contractions, leaking of fluid and fetal movement were reviewed in detail with the patient. Please refer to After Visit Summary for  other counseling recommendations.  No follow-ups on file.   Rudy Carlin LABOR, MD "

## 2024-09-01 NOTE — Progress Notes (Signed)
 Pt presents for rob. Pt has no questions or concerns at this time. Pt wants to have a cervical check today.

## 2024-09-02 ENCOUNTER — Other Ambulatory Visit (HOSPITAL_COMMUNITY)
Admission: RE | Admit: 2024-09-02 | Discharge: 2024-09-02 | Disposition: A | Source: Ambulatory Visit | Attending: Obstetrics and Gynecology | Admitting: Obstetrics and Gynecology

## 2024-09-02 ENCOUNTER — Ambulatory Visit: Payer: Self-pay

## 2024-09-02 VITALS — BP 117/69 | HR 74 | Wt 237.6 lb

## 2024-09-02 DIAGNOSIS — Z8619 Personal history of other infectious and parasitic diseases: Secondary | ICD-10-CM

## 2024-09-02 LAB — CERVICOVAGINAL ANCILLARY ONLY
Bacterial Vaginitis (gardnerella): NEGATIVE
Candida Glabrata: NEGATIVE
Candida Vaginitis: NEGATIVE
Chlamydia: POSITIVE — AB
Comment: NEGATIVE
Comment: NEGATIVE
Comment: NEGATIVE
Comment: NEGATIVE
Comment: NEGATIVE
Comment: NORMAL
Neisseria Gonorrhea: NEGATIVE
Trichomonas: NEGATIVE

## 2024-09-02 NOTE — Progress Notes (Cosign Needed Addendum)
..  SUBJECTIVE:  29 y.o. pregnant female is in the office for [facility] for previous CT infection. Denies abnormal vaginal bleeding or significant pelvic pain or fever. No UTI symptoms. Reports good fetal movement, denies any pain/contractions.  Patient's last menstrual period was 12/02/2023 (exact date).  OBJECTIVE:  She appears well, afebrile. Urine dipstick: not done.  ASSESSMENT:  TOC Hx of CT   PLAN:  GC, chlamydia, trichomonas, BVAG, CVAG probe sent to lab. Treatment: To be determined once lab results are received ROV prn if symptoms persist or worsen.

## 2024-09-03 ENCOUNTER — Other Ambulatory Visit: Payer: Self-pay

## 2024-09-03 ENCOUNTER — Inpatient Hospital Stay (HOSPITAL_COMMUNITY)
Admission: AD | Admit: 2024-09-03 | Discharge: 2024-09-03 | Disposition: A | Discharge status: Home / Self Care | Attending: Obstetrics & Gynecology | Admitting: Obstetrics & Gynecology

## 2024-09-03 DIAGNOSIS — Z3480 Encounter for supervision of other normal pregnancy, unspecified trimester: Secondary | ICD-10-CM

## 2024-09-03 DIAGNOSIS — A562 Chlamydial infection of genitourinary tract, unspecified: Secondary | ICD-10-CM | POA: Diagnosis present

## 2024-09-03 DIAGNOSIS — Z3A39 39 weeks gestation of pregnancy: Secondary | ICD-10-CM | POA: Insufficient documentation

## 2024-09-03 DIAGNOSIS — A568 Sexually transmitted chlamydial infection of other sites: Secondary | ICD-10-CM | POA: Diagnosis not present

## 2024-09-03 DIAGNOSIS — O98313 Other infections with a predominantly sexual mode of transmission complicating pregnancy, third trimester: Secondary | ICD-10-CM | POA: Diagnosis present

## 2024-09-03 MED ORDER — AZITHROMYCIN 500 MG PO TABS
1000.0000 mg | ORAL_TABLET | Freq: Once | ORAL | Status: AC
  Administered 2024-09-03: 1000 mg via ORAL
  Filled 2024-09-03: qty 2

## 2024-09-03 NOTE — Discharge Instructions (Signed)
 You have been treated today for a positive chlamydia test on 09/02/2024.  Please make sure that your partner is also treated by his PCP and/or the health department.  Please refrain from having any sexual relations until you have both been treated properly.  You will need another test of cure in approximately 2 weeks.  Please contact the office with any questions or concerns.   Olam Dalton, MSN, [facility], Center for [employer]

## 2024-09-03 NOTE — MAU Note (Signed)
 Lauren Costa is a 29 y.o. at [redacted]w[redacted]d here in MAU reporting: she desires treatment for +Chlamydia test.  States tested positive earlier this month, was treated and completed antibiotics.  Reports hasn't been sexually active since diagnosis. Denies VB and LOF.  Reports +FM.  Denies pregnancy complaints  LMP: 12/02/2023 Onset of complaint: today Pain score: 0 Vitals:   09/03/24 1535  BP: 132/62  Pulse: 78  Resp: 18  Temp: 98.5 F (36.9 C)  SpO2: 99%     FHT: 149 bpm  Lab orders placed from triage: None

## 2024-09-03 NOTE — MAU Provider Note (Incomplete)
 CC/ Treatment for + Chlamydia 09/02/24 resulted     S/HPI [name] is a 29 y.o. [phone] patient who presents to MAU today with complaint of patient is 39 weeks 3 days here in the MAU reporting she saw her results for positive chlamydia test that was completed on 09/02/2024 as a test of cure.  The result was still positive and she is requesting to be retreated at this time.  She offers no OB complaints and denies any vaginal bleeding, leaking of fluid, contractions, and reports good fetal movements  Prenatal care is received at [facility]    Objective BP 132/62 (BP Location: Right Arm)   Pulse 78   Temp 98.5 F (36.9 C) (Oral)   Resp 18   Ht 5' 5 (1.651 m)   Wt 108.9 kg   LMP 12/02/2023 (Exact Date)   SpO2 99%   BMI 39.95 kg/m  Physical Exam Vitals and nursing note reviewed.  Constitutional:      General: She is not in acute distress.    Appearance: Normal appearance. She is obese. She is not ill-appearing.  HENT:     Head: Normocephalic.  Cardiovascular:     Rate and Rhythm: Normal rate.  Pulmonary:     Effort: Pulmonary effort is normal.  Musculoskeletal:        General: Normal range of motion.     Cervical back: Normal range of motion.  Skin:    General: Skin is warm.  Neurological:     Mental Status: She is alert and oriented to person, place, and time.     MDM  LOW  Lab Results  Component Value Date   CHLAMYDIAWP Positive (A) 09/02/2024      Meds ordered this encounter  Medications   azithromycin  (ZITHROMAX ) tablet 1,000 mg     ASSESSMENT/PLAN Medical screening exam complete   Chlamydia trachomatis infection in pregnancy in third trimester Treated with Azithromycin  1 gram x 1 PO here in the presence of RN  [redacted] weeks gestation of pregnancy No OB Complaints FHR  149   Future Appointments  Date Time Provider Department Center  09/08/2024  8:55 AM Rudy Carlin LABOR, MD [facility] None  09/14/2024  7:00 AM [facility] SCHED ROOM [facility] None     Discharge from MAU in stable condition  See AVS for full description of educational information and instructions provided to the patient at time of discharge   Warning signs for worsening condition that would warrant emergency follow-up discussed  Patient may return to MAU as needed   Littie Olam LABOR, NP 09/03/2024 4:25 PM

## 2024-09-05 ENCOUNTER — Ambulatory Visit: Payer: Self-pay | Admitting: Obstetrics and Gynecology

## 2024-09-05 DIAGNOSIS — A749 Chlamydial infection, unspecified: Secondary | ICD-10-CM

## 2024-09-07 ENCOUNTER — Other Ambulatory Visit: Payer: Self-pay

## 2024-09-07 ENCOUNTER — Inpatient Hospital Stay (HOSPITAL_COMMUNITY)
Admission: AD | Admit: 2024-09-07 | Discharge: 2024-09-09 | DRG: 807 | Disposition: A | Discharge status: Home / Self Care | Attending: Family Medicine | Admitting: Family Medicine

## 2024-09-07 ENCOUNTER — Encounter (HOSPITAL_COMMUNITY): Payer: Self-pay | Admitting: Obstetrics & Gynecology

## 2024-09-07 ENCOUNTER — Telehealth (HOSPITAL_COMMUNITY): Payer: Self-pay | Admitting: *Deleted

## 2024-09-07 ENCOUNTER — Encounter (HOSPITAL_COMMUNITY): Payer: Self-pay | Admitting: *Deleted

## 2024-09-07 DIAGNOSIS — Z87891 Personal history of nicotine dependence: Secondary | ICD-10-CM

## 2024-09-07 DIAGNOSIS — O4290 Premature rupture of membranes, unspecified as to length of time between rupture and onset of labor, unspecified weeks of gestation: Principal | ICD-10-CM | POA: Diagnosis present

## 2024-09-07 DIAGNOSIS — O9962 Diseases of the digestive system complicating childbirth: Secondary | ICD-10-CM | POA: Diagnosis present

## 2024-09-07 DIAGNOSIS — O26893 Other specified pregnancy related conditions, third trimester: Secondary | ICD-10-CM | POA: Diagnosis present

## 2024-09-07 DIAGNOSIS — Z3A40 40 weeks gestation of pregnancy: Secondary | ICD-10-CM | POA: Diagnosis not present

## 2024-09-07 DIAGNOSIS — K219 Gastro-esophageal reflux disease without esophagitis: Secondary | ICD-10-CM | POA: Diagnosis present

## 2024-09-07 DIAGNOSIS — O9902 Anemia complicating childbirth: Secondary | ICD-10-CM | POA: Diagnosis present

## 2024-09-07 DIAGNOSIS — O99214 Obesity complicating childbirth: Secondary | ICD-10-CM | POA: Diagnosis present

## 2024-09-07 LAB — TYPE AND SCREEN
ABO/RH(D): O POS
Antibody Screen: NEGATIVE

## 2024-09-07 MED ORDER — FENTANYL CITRATE (PF) 100 MCG/2ML IJ SOLN
100.0000 ug | INTRAMUSCULAR | Status: DC | PRN
  Administered 2024-09-08: 100 ug via INTRAVENOUS
  Filled 2024-09-07: qty 2

## 2024-09-07 MED ORDER — OXYTOCIN BOLUS FROM INFUSION
333.0000 mL | Freq: Once | INTRAVENOUS | Status: AC
  Administered 2024-09-08: 333 mL via INTRAVENOUS

## 2024-09-07 MED ORDER — OXYCODONE-ACETAMINOPHEN 5-325 MG PO TABS: 1.0000 | ORAL_TABLET | ORAL | Status: DC | PRN

## 2024-09-07 MED ORDER — OXYTOCIN-SODIUM CHLORIDE 30-0.9 UT/500ML-% IV SOLN
2.5000 [IU]/h | INTRAVENOUS | Status: DC
  Filled 2024-09-07: qty 500

## 2024-09-07 MED ORDER — ACETAMINOPHEN 325 MG PO TABS: 650.0000 mg | ORAL_TABLET | ORAL | Status: DC | PRN

## 2024-09-07 MED ORDER — SOD CITRATE-CITRIC ACID 500-334 MG/5ML PO SOLN: 30.0000 mL | ORAL | Status: DC | PRN

## 2024-09-07 MED ORDER — LIDOCAINE HCL (PF) 1 % IJ SOLN: 30.0000 mL | INTRAMUSCULAR | Status: DC | PRN

## 2024-09-07 MED ORDER — OXYCODONE-ACETAMINOPHEN 5-325 MG PO TABS: 2.0000 | ORAL_TABLET | ORAL | Status: DC | PRN

## 2024-09-07 MED ORDER — ONDANSETRON HCL 4 MG/2ML IJ SOLN
4.0000 mg | Freq: Four times a day (QID) | INTRAMUSCULAR | Status: DC | PRN
  Administered 2024-09-08: 4 mg via INTRAVENOUS
  Filled 2024-09-07: qty 2

## 2024-09-07 MED ORDER — LACTATED RINGERS IV SOLN: INTRAVENOUS | Status: DC

## 2024-09-07 MED ORDER — LACTATED RINGERS IV SOLN
500.0000 mL | INTRAVENOUS | Status: DC | PRN
  Administered 2024-09-08: 500 mL via INTRAVENOUS

## 2024-09-07 NOTE — MAU Note (Signed)
 Pt brought straight MAU exam room for urge to push. Reporting SROM around 2120 with ctx starting immediately after. Denies VB.   SVE: 2 / 50 / -3 light mec fluid noted.   Cashion, MD at bedside. BSUS performed to confirm vertex presentation.

## 2024-09-07 NOTE — H&P (Signed)
 Lauren Costa is a 29 y.o. [phone] female at [redacted]w[redacted]d by LMP c/w [redacted]w[redacted]d u/s presenting with SROM at 2120 followed by reg ctx.   Reports active fetal movement, contractions: reg q 5 mins, vaginal bleeding: scant staining, membranes: ruptured, meconium light.  Initiated prenatal care at [facility] at 12.1 wks.   Most recent u/s: [redacted]w[redacted]d, EFW 76%, ant placenta, cephalic, AFI 16cm, BPP 8/8 .   This pregnancy complicated by: # +chlamydia 12/31 and 1/23; retx in MAU 09/03/24  Prenatal History/Complications:  # term vag delivery in 2018  Past Medical History: Past Medical History:  Diagnosis Date   Anxiety    as a child   Bilateral breast lump 03/19/2016   Chronic kidney disease    kidney infection    Past Surgical History: Past Surgical History:  Procedure Laterality Date   DENTAL SURGERY      Obstetrical History: OB History     Gravida  5   Para  1   Term  1   Preterm  0   AB  3   Living  1      SAB  0   IAB  3   Ectopic      Multiple  0   Live Births  1           Social History: Social History   Socioeconomic History   Marital status: Single    Spouse name: Not on file   Number of children: Not on file   Years of education: Not on file   Highest education level: 12th grade  Occupational History   Not on file  Tobacco Use   Smoking status: Former    Current packs/day: 0.15    Average packs/day: 0.2 packs/day for 8.0 years (1.2 ttl pk-yrs)    Types: Cigarettes   Smokeless tobacco: Former  Building Services Engineer status: Never Used  Substance and Sexual Activity   Alcohol use: Not Currently   Drug use: Not Currently    Types: Marijuana    Comment: Last smoked 2-3 years ago   Sexual activity: Yes    Partners: Male    Birth control/protection: None  Other Topics Concern   Not on file  Social History Narrative   Not on file   Social Drivers of Health   Tobacco Use: Medium Risk (09/07/2024)   Patient History    Smoking Tobacco Use: Former     Smokeless Tobacco Use: Former    Passive Exposure: Not on Actuary Strain: Low Risk (02/06/2024)   Overall Financial Resource Strain (CARDIA)    Difficulty of Paying Living Expenses: Not hard at all  Food Insecurity: No Food Insecurity (02/06/2024)   Epic    Worried About Radiation Protection Practitioner of Food in the Last Year: Never true    Ran Out of Food in the Last Year: Never true  Transportation Needs: No Transportation Needs (02/06/2024)   Epic    Lack of Transportation (Medical): No    Lack of Transportation (Non-Medical): No  Physical Activity: Insufficiently Active (02/06/2024)   Exercise Vital Sign    Days of Exercise per Week: 1 day    Minutes of Exercise per Session: 30 min  Stress: No Stress Concern Present (02/06/2024)   [employer] of Occupational Health - Occupational Stress Questionnaire    Feeling of Stress: Only a little  Social Connections: Moderately Isolated (02/06/2024)   Social Connection and Isolation Panel    Frequency of Communication with Friends and  Family: Twice a week    Frequency of Social Gatherings with Friends and Family: Once a week    Attends Religious Services: More than 4 times per year    Active Member of Clubs or Organizations: No    Attends Engineer, Structural: Not on file    Marital Status: Never married  Depression (PHQ2-9): Low Risk (08/10/2024)   Depression (PHQ2-9)    PHQ-2 Score: 3  Alcohol Screen: Not on file  Housing: Unknown (02/06/2024)   Epic    Unable to Pay for Housing in the Last Year: No    Number of Times Moved in the Last Year: Not on file    Homeless in the Last Year: No  Utilities: Not on file  Health Literacy: Not on file    Family History: Family History  Problem Relation Age of Onset   Fibroids Mother    Healthy Father    Cancer Maternal Aunt    Cancer Maternal Uncle    Alcohol abuse Neg Hx    Arthritis Neg Hx    Asthma Neg Hx    Birth defects Neg Hx    COPD Neg Hx    Depression Neg Hx     Diabetes Neg Hx    Drug abuse Neg Hx    Early death Neg Hx    Hearing loss Neg Hx    Heart disease Neg Hx    Hyperlipidemia Neg Hx    Hypertension Neg Hx    Kidney disease Neg Hx    Learning disabilities Neg Hx    Mental illness Neg Hx    Mental retardation Neg Hx    Miscarriages / Stillbirths Neg Hx    Stroke Neg Hx    Vision loss Neg Hx    Varicose Veins Neg Hx     Allergies: Allergies[1]  Medications Prior to Admission  Medication Sig Dispense Refill Last Dose/Taking   loratadine  (CLARITIN ) 10 MG tablet Take 1 tablet (10 mg total) by mouth daily. (Patient not taking: Reported on 08/25/2024) 30 tablet 11    Prenatal Vit-Fe Fumarate-FA (PRENATAL PLUS VITAMIN/MINERAL) 27-1 MG TABS Take 1 tablet by mouth daily. 30 tablet 11    terconazole  (TERAZOL 3 ) 0.8 % vaginal cream Place 1 applicator vaginally at bedtime. (Patient not taking: Reported on 08/25/2024) 20 g 0     Review of Systems  Pertinent pos/neg as indicated in HPI  Blood pressure 131/72, pulse 99, temperature 98.5 F (36.9 C), temperature source Oral, resp. rate 20, last menstrual period 12/02/2023, unknown if currently breastfeeding. General appearance: alert, cooperative, and no distress Lungs: clear to auscultation bilaterally Heart: regular rate and rhythm Abdomen: gravid, soft, non-tender, EFW by Leopold's approximately 7-8lbs Extremities: tr edema  Fetal monitoring: FHR: 135-140s bpm, variability: moderate,  Accelerations: Present,  decelerations:  Absent Uterine activity: Frequency: Every 3-4 minutes Dilation: 2 Effacement (%): 50 Station: -3 Exam by:: Lum Epley, RN Presentation: cephalic   Prenatal labs: ABO, Rh: O/Positive/-- (07/17 1017) Antibody: Negative (07/17 1017) Rubella: 2.99 (07/17 1017) RPR: Non Reactive (11/03 1147)  HBsAg: Negative (07/17 1017)  HIV: Non Reactive (11/03 1147)  GBS: Negative/-- (12/31 1120)  2hr GTT: 83/102/90  Prenatal Transfer Tool  Maternal Diabetes:  No Genetic Screening: Normal Maternal Ultrasounds/Referrals: Normal Fetal Ultrasounds or other Referrals:  None Maternal Substance Abuse:  No Significant Maternal Medications:  None Significant Maternal Lab Results: Group B Strep negative and Other: +chlamydia- retreated 09/03/24  No results found for this or any previous visit (from the  past 24 hours).   Assessment:  [redacted]w[redacted]d SIUP  G5P1031  SROM with light MSF  Latent labor  Cat 1 FHR  GBS Negative/-- (12/31 1120)  Plan:  Admit to L&D  IV pain meds/epidural prn active labor  Expectant management  Anticipate vag delivery   Plans to breast and bottlefeed  Contraception: Nexplanon outpatient   Suzen JONETTA Gentry CNM 09/07/2024, 10:34 PM      [1]  Allergies Allergen Reactions   Peanut-Containing Drug Products Hives

## 2024-09-07 NOTE — Telephone Encounter (Signed)
 Preadmission screen

## 2024-09-08 ENCOUNTER — Encounter: Payer: Self-pay | Admitting: Obstetrics

## 2024-09-08 ENCOUNTER — Inpatient Hospital Stay (HOSPITAL_COMMUNITY): Admitting: Anesthesiology

## 2024-09-08 ENCOUNTER — Encounter (HOSPITAL_COMMUNITY): Payer: Self-pay | Admitting: Obstetrics & Gynecology

## 2024-09-08 LAB — CBC
HCT: 34.4 % — ABNORMAL LOW (ref 36.0–46.0)
Hemoglobin: 11.3 g/dL — ABNORMAL LOW (ref 12.0–15.0)
MCH: 26 pg (ref 26.0–34.0)
MCHC: 32.8 g/dL (ref 30.0–36.0)
MCV: 79.1 fL — ABNORMAL LOW (ref 80.0–100.0)
Platelets: 242 10*3/uL (ref 150–400)
RBC: 4.35 MIL/uL (ref 3.87–5.11)
RDW: 14.3 % (ref 11.5–15.5)
WBC: 7.3 10*3/uL (ref 4.0–10.5)
nRBC: 0 % (ref 0.0–0.2)

## 2024-09-08 LAB — SYPHILIS: RPR W/REFLEX TO RPR TITER AND TREPONEMAL ANTIBODIES, TRADITIONAL SCREENING AND DIAGNOSIS ALGORITHM: RPR Ser Ql: NONREACTIVE

## 2024-09-08 MED ORDER — SENNOSIDES-DOCUSATE SODIUM 8.6-50 MG PO TABS
2.0000 | ORAL_TABLET | Freq: Every day | ORAL | Status: DC
  Administered 2024-09-09: 2 via ORAL
  Filled 2024-09-08: qty 2

## 2024-09-08 MED ORDER — BENZOCAINE-MENTHOL 20-0.5 % EX AERO
1.0000 | INHALATION_SPRAY | CUTANEOUS | Status: DC | PRN
  Administered 2024-09-08: 1 via TOPICAL
  Filled 2024-09-08: qty 56

## 2024-09-08 MED ORDER — EPHEDRINE 5 MG/ML INJ: 10.0000 mg | INTRAVENOUS | Status: DC | PRN

## 2024-09-08 MED ORDER — LIDOCAINE-EPINEPHRINE (PF) 2 %-1:200000 IJ SOLN: INTRAMUSCULAR | Status: DC | PRN

## 2024-09-08 MED ORDER — DIPHENHYDRAMINE HCL 50 MG/ML IJ SOLN: 12.5000 mg | INTRAMUSCULAR | Status: DC | PRN

## 2024-09-08 MED ORDER — TETANUS-DIPHTH-ACELL PERTUSSIS 5-2-15.5 LF-MCG/0.5 IM SUSP: 0.5000 mL | Freq: Once | INTRAMUSCULAR | Status: DC

## 2024-09-08 MED ORDER — PHENYLEPHRINE 80 MCG/ML (10ML) SYRINGE FOR IV PUSH (FOR BLOOD PRESSURE SUPPORT): 80.0000 ug | PREFILLED_SYRINGE | INTRAVENOUS | Status: DC | PRN

## 2024-09-08 MED ORDER — COCONUT OIL OIL: 1.0000 | TOPICAL_OIL | Status: DC | PRN

## 2024-09-08 MED ORDER — WITCH HAZEL-GLYCERIN EX PADS: 1.0000 | MEDICATED_PAD | CUTANEOUS | Status: DC | PRN

## 2024-09-08 MED ORDER — ONDANSETRON HCL 4 MG/2ML IJ SOLN: 4.0000 mg | INTRAMUSCULAR | Status: DC | PRN

## 2024-09-08 MED ORDER — ACETAMINOPHEN 325 MG PO TABS: 650.0000 mg | ORAL_TABLET | ORAL | Status: DC | PRN

## 2024-09-08 MED ORDER — LIDOCAINE HCL (PF) 1 % IJ SOLN
INTRAMUSCULAR | Status: DC | PRN
  Administered 2024-09-08 (×2): 5 mL via EPIDURAL

## 2024-09-08 MED ORDER — FENTANYL-BUPIVACAINE-NACL 0.5-0.125-0.9 MG/250ML-% EP SOLN
12.0000 mL/h | EPIDURAL | Status: DC | PRN
  Administered 2024-09-08: 12 mL/h via EPIDURAL
  Filled 2024-09-08: qty 250

## 2024-09-08 MED ORDER — DIBUCAINE (PERIANAL) 1 % EX OINT: 1.0000 | TOPICAL_OINTMENT | CUTANEOUS | Status: DC | PRN

## 2024-09-08 MED ORDER — ZOLPIDEM TARTRATE 5 MG PO TABS: 5.0000 mg | ORAL_TABLET | Freq: Every evening | ORAL | Status: DC | PRN

## 2024-09-08 MED ORDER — PRENATAL MULTIVITAMIN CH
1.0000 | ORAL_TABLET | Freq: Every day | ORAL | Status: DC
  Administered 2024-09-08 – 2024-09-09 (×2): 1 via ORAL
  Filled 2024-09-08 (×2): qty 1

## 2024-09-08 MED ORDER — ONDANSETRON HCL 4 MG PO TABS: 4.0000 mg | ORAL_TABLET | ORAL | Status: DC | PRN

## 2024-09-08 MED ORDER — SIMETHICONE 80 MG PO CHEW: 80.0000 mg | CHEWABLE_TABLET | ORAL | Status: DC | PRN

## 2024-09-08 MED ORDER — IBUPROFEN 600 MG PO TABS
600.0000 mg | ORAL_TABLET | Freq: Four times a day (QID) | ORAL | Status: DC
  Administered 2024-09-08 – 2024-09-09 (×5): 600 mg via ORAL
  Filled 2024-09-08 (×5): qty 1

## 2024-09-08 MED ORDER — DIPHENHYDRAMINE HCL 25 MG PO CAPS: 25.0000 mg | ORAL_CAPSULE | Freq: Four times a day (QID) | ORAL | Status: DC | PRN

## 2024-09-08 MED ORDER — LACTATED RINGERS IV SOLN
500.0000 mL | Freq: Once | INTRAVENOUS | Status: AC
  Administered 2024-09-08: 500 mL via INTRAVENOUS

## 2024-09-08 NOTE — Discharge Summary (Signed)
 "  Postpartum Discharge Summary     Patient Name: Lauren Costa DOB: [birth year] MRN: [id number]  Date of admission: 09/07/2024 Delivery date:09/08/2024 Delivering provider: JOMARIE CAMPI A Date of discharge: 09/09/2024  Admitting diagnosis: Amniotic fluid leaking [O42.90] Intrauterine pregnancy: [redacted]w[redacted]d     Secondary diagnosis:  Principal Problem:   Amniotic fluid leaking  Additional problems: Recent chlamydia infection ( Treated 1/24)   Discharge diagnosis: Term Pregnancy Delivered                                              Post partum procedures:None Augmentation: N/A Complications: None  Hospital course: Onset of Labor With Vaginal Delivery      29 y.o. yo [id number] at [redacted]w[redacted]d was admitted in Latent Labor on 09/07/2024. Labor course was uncomplicated  Membrane Rupture Time/Date: 9:20 PM,09/07/2024  Delivery Method:Vaginal, Spontaneous Operative Delivery:N/A Episiotomy: None Lacerations:  Periurethral Patient had an uncomplicated postpartum course.  She is ambulating, tolerating a regular diet, passing flatus, and urinating well. Patient is discharged home in stable condition on 09/09/24.  Newborn Data: Birth date:09/08/2024 Birth time:8:32 AM Gender:Female Living status:Living Apgars:9 ,9  Weight:3450 g  Magnesium Sulfate received: No BMZ received: No Rhophylac:N/A MMR:N/A T-DaP:declined Flu: declined RSV Vaccine received: declined Transfusion:No  Immunizations received: Immunization History  Administered Date(s) Administered   HPV 9-valent 06/25/2021   Influenza,inj,Quad PF,6+ Mos 07/11/2016    Physical exam  Vitals:   09/08/24 1630 09/08/24 2039 09/09/24 0144 09/09/24 0620  BP: 138/62 115/76 123/72 110/64  Pulse: 67 69    Resp: 18 18 18 18   Temp: 97.9 F (36.6 C) 98.1 F (36.7 C) 98 F (36.7 C) (!) 97.5 F (36.4 C)  TempSrc: Oral Oral Oral Oral  SpO2: 100% 99% 100% 100%  Weight:      Height:       General: alert and cooperative Lochia:  appropriate Uterine Fundus: firm Incision: N/A DVT Evaluation: No evidence of DVT seen on physical exam. Labs: Lab Results  Component Value Date   WBC 7.3 09/07/2024   HGB 11.3 (L) 09/07/2024   HCT 34.4 (L) 09/07/2024   MCV 79.1 (L) 09/07/2024   PLT 242 09/07/2024      Latest Ref Rng & Units 02/25/2024   10:17 AM  CMP  Glucose 70 - 99 mg/dL 85   BUN 6 - 20 mg/dL 8   Creatinine 9.42 - 8.99 mg/dL 9.40   Sodium 865 - 855 mmol/L 138   Potassium 3.5 - 5.2 mmol/L 4.3   Chloride 96 - 106 mmol/L 103   CO2 20 - 29 mmol/L 20   Calcium 8.7 - 10.2 mg/dL 9.4   Total Protein 6.0 - 8.5 g/dL 7.2   Total Bilirubin 0.0 - 1.2 mg/dL 0.3   Alkaline Phos 44 - 121 IU/L 92   AST 0 - 40 IU/L 11   ALT 0 - 32 IU/L 14    Edinburgh Score:    09/09/2024    8:54 AM  Edinburgh Postnatal Depression Scale Screening Tool  I have been able to laugh and see the funny side of things. 0  I have looked forward with enjoyment to things. 0  I have blamed myself unnecessarily when things went wrong. 1  I have been anxious or worried for no good reason. 0  I have felt scared or panicky for no good reason. 1  Things have  been getting on top of me. 1  I have been so unhappy that I have had difficulty sleeping. 1  I have felt sad or miserable. 1  I have been so unhappy that I have been crying. 1  The thought of harming myself has occurred to me. 0  Edinburgh Postnatal Depression Scale Total 6   Edinburgh Postnatal Depression Scale Total: 6   After visit meds:  Allergies as of 09/09/2024       Reactions   Peanut-containing Drug Products Hives        Medication List     STOP taking these medications    loratadine  10 MG tablet Commonly known as: Claritin    terconazole  0.8 % vaginal cream Commonly known as: TERAZOL 3        TAKE these medications    acetaminophen  325 MG tablet Commonly known as: Tylenol  Take 2 tablets (650 mg total) by mouth every 4 (four) hours as needed (for pain scale <  4).   ibuprofen  600 MG tablet Commonly known as: ADVIL  Take 1 tablet (600 mg total) by mouth every 6 (six) hours.   Prenatal Plus Vitamin/Mineral 27-1 MG Tabs Take 1 tablet by mouth daily.   senna-docusate 8.6-50 MG tablet Commonly known as: Senokot-S Take 2 tablets by mouth daily.         Discharge home in stable condition Infant Feeding: Bottle and Breast Infant Disposition:home with mother Discharge instruction: per After Visit Summary and Postpartum booklet. Activity: Advance as tolerated. Pelvic rest for 6 weeks.  Diet: routine diet Future Appointments: Future Appointments  Date Time Provider Department Center  09/14/2024  7:00 AM [facility] SCHED ROOM [facility] None  10/21/2024  9:35 AM Delores Nidia CROME, FNP [facility] None   Follow up Visit:  Message sent to [facility] 1/29  Please schedule this patient for a In person postpartum visit in 6 weeks with the following provider: Any provider. Additional Postpartum F/U: test of cure for Chlamydia Low risk pregnancy Delivery mode:  Vaginal, Spontaneous Anticipated Birth Control:  Nexplanon outpatient   09/09/2024 [name], MD    "

## 2024-09-08 NOTE — Anesthesia Procedure Notes (Addendum)
 Epidural Patient location during procedure: OB Start time: 09/08/2024 1:23 AM End time: 09/08/2024 1:34 AM  Staffing Anesthesiologist: Jerrye Sharper, MD Performed: anesthesiologist   Preanesthetic Checklist Completed: patient identified, IV checked, site marked, risks and benefits discussed, surgical consent, monitors and equipment checked, pre-op evaluation and timeout performed  Epidural Patient position: sitting Prep: DuraPrep and site prepped and draped Patient monitoring: continuous pulse ox and blood pressure Approach: midline Location: L3-L4 Injection technique: LOR air and LOR saline  Needle:  Needle type: Tuohy  Needle gauge: 17 G Needle length: 9 cm and 9 Needle insertion depth: 8 cm Catheter type: closed end flexible Catheter size: 19 Gauge Catheter at skin depth: 13 cm Test dose: negative and Other  Assessment Events: blood not aspirated, no cerebrospinal fluid, injection not painful, no injection resistance, no paresthesia and negative IV test  Additional Notes Patient identified. Risks and benefits discussed including failed block, incomplete  Pain control, post dural puncture headache, nerve damage, paralysis, blood pressure Changes, nausea, vomiting, reactions to medications-both toxic and allergic and post Partum back pain. All questions were answered. Patient expressed understanding and wished to proceed. Sterile technique was used throughout procedure. Epidural site was Dressed with sterile barrier dressing. No paresthesias, signs of intravascular injection Or signs of intrathecal spread were encountered.  Patient was more comfortable after the epidural was dosed. Please see RN's note for documentation of vital signs and FHR which are stable. Reason for block:procedure for pain

## 2024-09-08 NOTE — Progress Notes (Signed)
 Lauren Costa is a 29 y.o. [phone] at [redacted]w[redacted]d admitted for spontaneous onset of labor/SROM  Subjective: Patient is doing well and comfortable with epidural.  Objective: BP (!) 121/50   Pulse 85   Temp 98.1 F (36.7 C) (Axillary)   Resp 18   Ht 5' 5 (1.651 m)   Wt 106.6 kg   LMP 12/02/2023 (Exact Date)   SpO2 100%   BMI 39.11 kg/m   FHT:  FHR: 120 bpm, variability: moderate,  accelerations:  Present,  decelerations:  Absent UC:   every 3-5 minutes SVE:   Dilation: 5 Effacement (%): 90 Station: -1 Exam by:: Lum Sharps RN at 0204  Labs: Lab Results  Component Value Date   WBC 7.3 09/07/2024   HGB 11.3 (L) 09/07/2024   HCT 34.4 (L) 09/07/2024   MCV 79.1 (L) 09/07/2024   PLT 242 09/07/2024    Assessment / Plan: Spontaneous labor, progressing normally  Labor: Progressing normally. Will augment with pitocin  if needed. Fetal Wellbeing:  Category I Pain Control:  Epidural I/D:  GBS neg, but was treated for Chlamyda on 09/03/24 Anticipated MOD:  NSVD   Torrance Frech, MD 09/08/2024, 2:56 AM

## 2024-09-08 NOTE — Lactation Note (Signed)
 This note was copied from a baby's chart. Lactation Consultation Note  Patient Name: Lauren Costa Unijb'd Date: 09/08/2024 Age:29 hours Reason for consult: Initial assessment;Term;Breastfeeding assistance Moms request.  As LC entered per mom the baby had just fed 10 ml and she was still rooting. LC offered to see if she would latch and mom receptive,  LC provided firm support, showed mom hand express, ( drops noted ) and after several attenpts the baby latched with depth, swallows increased with breast compressions. Mom mentioned she was cramping.  LC recommended to empty her bladder often especially prior to breast feeding.  LC recommended encouraged mom to feed the baby at the breast with cues and by 3 hours STS. If she is still hungry after the 1st breast offer the 2nd breast . If supplementing keep low .  24 hour feeding goals 8-12 times in 24 hours.   Maternal Data Has patient been taught Hand Expression?: Yes Does the patient have breastfeeding experience prior to this delivery?: Yes How long did the patient breastfeed?: per mom breastfed 6 months ( its been 7 years)  Feeding Mother's Current Feeding Choice: Breast Milk and Formula Nipple Type: Slow - flow  LATCH Score Latch: Repeated attempts needed to sustain latch, nipple held in mouth throughout feeding, stimulation needed to elicit sucking reflex.  Audible Swallowing: A few with stimulation  Type of Nipple: Everted at rest and after stimulation (some areola edema, reverse pressure helped)  Comfort (Breast/Nipple): Soft / non-tender  Hold (Positioning): Assistance needed to correctly position infant at breast and maintain latch.  LATCH Score: 7   Lactation Tools Discussed/Used  Not needed as of yet   Interventions Interventions: Breast feeding basics reviewed;Assisted with latch;Skin to skin;Hand express;Reverse pressure;Breast compression;Adjust position;Support pillows;Position options;Education;LC Services  brochure;CDC milk storage guidelines;CDC Guidelines for Breast Pump Cleaning  Discharge Pump: Personal;DEBP  Consult Status Consult Status: Follow-up Date: 09/09/24 Follow-up type: In-patient    Rollene Caldron Skyy Mcknight 09/08/2024, 6:36 PM

## 2024-09-08 NOTE — Anesthesia Preprocedure Evaluation (Signed)
 "                                  Anesthesia Evaluation  Patient identified by MRN, date of birth, ID band Patient awake    Reviewed: Allergy & Precautions, Patient's Chart, lab work & pertinent test results  Airway Mallampati: II       Dental no notable dental hx.    Pulmonary former smoker   Pulmonary exam normal        Cardiovascular negative cardio ROS Normal cardiovascular exam Rhythm:Regular     Neuro/Psych   Anxiety     negative neurological ROS     GI/Hepatic Neg liver ROS,GERD  ,,  Endo/Other  Obesity  Renal/GU Renal disease  negative genitourinary   Musculoskeletal negative musculoskeletal ROS (+)    Abdominal  (+) + obese  Peds  Hematology  (+) Blood dyscrasia, anemia Lab Results      Component                Value               Date                      WBC                      7.3                 09/07/2024                HGB                      11.3 (L)            09/07/2024                HCT                      34.4 (L)            09/07/2024                MCV                      79.1 (L)            09/07/2024                PLT                      242                 09/07/2024              Anesthesia Other Findings   Reproductive/Obstetrics (+) Pregnancy                              Anesthesia Physical Anesthesia Plan  ASA: 2  Anesthesia Plan: Epidural   Post-op Pain Management: Minimal or no pain anticipated   Induction:   PONV Risk Score and Plan: Treatment may vary due to age or medical condition  Airway Management Planned: Natural Airway  Additional Equipment: Fetal Monitoring and None  Intra-op Plan:   Post-operative Plan:   Informed Consent: I have reviewed the patients History and Physical, chart, labs and discussed the procedure  including the risks, benefits and alternatives for the proposed anesthesia with the patient or authorized representative who has indicated his/her  understanding and acceptance.       Plan Discussed with: Anesthesiologist  Anesthesia Plan Comments:         Anesthesia Quick Evaluation  "

## 2024-09-08 NOTE — Anesthesia Postprocedure Evaluation (Signed)
"   Anesthesia Post Note  Patient: Lauren Costa  Procedure(s) Performed: AN AD HOC LABOR EPIDURAL     Patient location during evaluation: Mother Baby Anesthesia Type: Epidural Level of consciousness: awake and alert Pain management: pain level controlled Vital Signs Assessment: post-procedure vital signs reviewed and stable Respiratory status: spontaneous breathing, nonlabored ventilation and respiratory function stable Cardiovascular status: stable Postop Assessment: no headache, no backache and epidural receding Anesthetic complications: no   No notable events documented.  Last Vitals:  Vitals:   09/08/24 1041 09/08/24 1150  BP: 120/61 124/63  Pulse: 68 66  Resp: 18 20  Temp: 37 C 36.9 C  SpO2: 100% 100%    Last Pain:  Vitals:   09/08/24 1358  TempSrc:   PainSc: 0-No pain   Pain Goal:                   [name]      "

## 2024-09-08 NOTE — Progress Notes (Signed)
 Patient ID: Lauren Costa, female   DOB: 1996/03/17, 29 y.o.   MRN: [id number]  Comfortable w epidural; feeling some pressure  VSS, afebrile FHR 120s, +accels, occ variables, avg LTV Ctx q 2-4 mins Cx 8+/80-90/vtx -1 per RN exam at 0722  IUP@40 .1wks Active labor/transition  Getting ready to get into various position to help baby rotate out of possible OP position Anticipate vag delivery  Suzen JONETTA Gentry CNM 09/08/2024

## 2024-09-09 ENCOUNTER — Other Ambulatory Visit (HOSPITAL_COMMUNITY): Payer: Self-pay

## 2024-09-09 MED ORDER — IBUPROFEN 600 MG PO TABS
600.0000 mg | ORAL_TABLET | Freq: Four times a day (QID) | ORAL | 0 refills | Status: AC
  Filled 2024-09-09: qty 120, 30d supply, fill #0

## 2024-09-09 MED ORDER — SENNOSIDES-DOCUSATE SODIUM 8.6-50 MG PO TABS
2.0000 | ORAL_TABLET | Freq: Every day | ORAL | 0 refills | Status: AC
  Filled 2024-09-09: qty 60, 30d supply, fill #0

## 2024-09-09 MED ORDER — ACETAMINOPHEN 325 MG PO TABS: 650.0000 mg | ORAL_TABLET | ORAL | Status: AC | PRN

## 2024-09-09 NOTE — Patient Instructions (Addendum)
 Your appointment with Outpatient Lactation is: Date:09/21/2024  Time:8:30am [facility] (First Floor) [address] [place]  Check in under baby's name.  Please bring your baby hungry along with your pump and a bottle of either formula or expressed breast milk. Please also bring your pump flanges and we welcome support people! If you need lactation assistance before your appointment, please call [phone] for lactation voice mail.   -- [facility] Lactation Support [facility]  Please join us  for our Center for [employer] Lactation Support Group at [employer] [facility] We meet every Tuesday at 10:00 am to 12:00 pm at [employer] on the second floor in the conference room Lactating parents and lap babies are welcome, no registration is required, if you have a lactation pillow please bring it with you. Call with any lactation questions or concerns at [phone].

## 2024-09-09 NOTE — Social Work (Signed)
 MOB was referred for history of depression/anxiety.  * Referral screened out by Clinical Social Worker because none of the following criteria appear to apply:  ~ History of anxiety/depression during this pregnancy, or of post-partum depression following prior delivery.  ~ Diagnosis of anxiety and/or depression within last 3 years OR * MOB's symptoms currently being treated with medication and/or therapy.  Per chart review MOB had anxiety as a chid, per OB records, no MH concern noted during this pregnancy. Edinburgh=6  Please contact the Clinical Social Worker if needs arise, or by MOB request.   Eliazar Gave, LCSWA Clinical Social Worker [phone]

## 2024-09-14 ENCOUNTER — Inpatient Hospital Stay (HOSPITAL_COMMUNITY): Admission: RE | Admit: 2024-09-14 | Source: Home / Self Care | Admitting: Family Medicine

## 2024-09-14 ENCOUNTER — Inpatient Hospital Stay (HOSPITAL_COMMUNITY)

## 2024-09-15 ENCOUNTER — Telehealth (HOSPITAL_COMMUNITY): Payer: Self-pay | Admitting: *Deleted

## 2024-09-15 NOTE — Telephone Encounter (Signed)
 09/15/2024  Name: Lauren Costa MRN: [id number] DOB: [birth year]  Reason for Call:  Transition of Care Hospital Discharge Call  Contact Status: Patient Contact Status: Complete  Language assistant needed:          Follow-Up Questions: Do You Have Any Concerns About Your Health As You Heal From Delivery?: No Do You Have Any Concerns About Your Infants Health?: No  Edinburgh Postnatal Depression Scale:  In the Past 7 Days: I have been able to laugh and see the funny side of things.: As much as I always could I have looked forward with enjoyment to things.: As much as I ever did I have blamed myself unnecessarily when things went wrong.: Not very often I have been anxious or worried for no good reason.: No, not at all I have felt scared or panicky for no good reason.: No, not at all Things have been getting on top of me.: No, most of the time I have coped quite well I have been so unhappy that I have had difficulty sleeping.: Not at all I have felt sad or miserable.: No, not at all I have been so unhappy that I have been crying.: No, never The thought of harming myself has occurred to me.: Never [place] Postnatal Depression Scale Total: 2  PHQ2-9 Depression Scale:     Discharge Follow-up: Edinburgh score requires follow up?: No Patient was advised of the following resources:: Breastfeeding Support Group, Support Group Requested email information - sent by RN Post-discharge interventions: Reviewed Newborn Safe Sleep Practices  Signature Allean IVAR Carton, RN, 09/15/24, [phone]

## 2024-10-21 ENCOUNTER — Ambulatory Visit: Payer: Self-pay | Admitting: Obstetrics and Gynecology
